# Patient Record
Sex: Female | Born: 1945 | Race: White | Hispanic: No | Smoking: Never smoker
Health system: Southern US, Community
[De-identification: ages and names within clinical notes are randomized; demographics above are authoritative.]

## PROBLEM LIST (undated history)

## (undated) DIAGNOSIS — C541 Malignant neoplasm of endometrium: Secondary | ICD-10-CM

## (undated) DIAGNOSIS — M1990 Unspecified osteoarthritis, unspecified site: Secondary | ICD-10-CM

## (undated) DIAGNOSIS — I10 Essential (primary) hypertension: Secondary | ICD-10-CM

## (undated) DIAGNOSIS — E785 Hyperlipidemia, unspecified: Secondary | ICD-10-CM

## (undated) DIAGNOSIS — T4145XA Adverse effect of unspecified anesthetic, initial encounter: Secondary | ICD-10-CM

## (undated) DIAGNOSIS — C801 Malignant (primary) neoplasm, unspecified: Secondary | ICD-10-CM

## (undated) DIAGNOSIS — M419 Scoliosis, unspecified: Secondary | ICD-10-CM

## (undated) DIAGNOSIS — T8859XA Other complications of anesthesia, initial encounter: Secondary | ICD-10-CM

## (undated) DIAGNOSIS — I219 Acute myocardial infarction, unspecified: Secondary | ICD-10-CM

## (undated) DIAGNOSIS — I341 Nonrheumatic mitral (valve) prolapse: Secondary | ICD-10-CM

## (undated) DIAGNOSIS — R0600 Dyspnea, unspecified: Secondary | ICD-10-CM

## (undated) DIAGNOSIS — I4510 Unspecified right bundle-branch block: Secondary | ICD-10-CM

## (undated) HISTORY — DX: Hyperlipidemia, unspecified: E78.5

## (undated) HISTORY — DX: Unspecified osteoarthritis, unspecified site: M19.90

## (undated) HISTORY — DX: Unspecified right bundle-branch block: I45.10

## (undated) HISTORY — DX: Essential (primary) hypertension: I10

## (undated) HISTORY — DX: Acute myocardial infarction, unspecified: I21.9

## (undated) HISTORY — DX: Nonrheumatic mitral (valve) prolapse: I34.1

## (undated) HISTORY — DX: Scoliosis, unspecified: M41.9

## (undated) HISTORY — PX: HYSTERECTOMY: SHX81

---

## 1999-11-27 DIAGNOSIS — I219 Acute myocardial infarction, unspecified: Secondary | ICD-10-CM

## 1999-11-27 HISTORY — DX: Acute myocardial infarction, unspecified: I21.9

## 2005-11-26 HISTORY — PX: KNEE SURGERY: SHX244

## 2014-08-11 ENCOUNTER — Encounter: Payer: Self-pay | Admitting: Family

## 2014-08-11 ENCOUNTER — Ambulatory Visit (INDEPENDENT_AMBULATORY_CARE_PROVIDER_SITE_OTHER): Payer: Medicare Other | Admitting: Family

## 2014-08-11 ENCOUNTER — Encounter (INDEPENDENT_AMBULATORY_CARE_PROVIDER_SITE_OTHER): Payer: Self-pay

## 2014-08-11 VITALS — BP 166/93 | HR 64 | Temp 97.5°F | Ht 63.5 in | Wt 175.8 lb

## 2014-08-11 DIAGNOSIS — Z0000 Encounter for general adult medical examination without abnormal findings: Secondary | ICD-10-CM

## 2014-08-11 DIAGNOSIS — Z13820 Encounter for screening for osteoporosis: Secondary | ICD-10-CM

## 2014-08-11 DIAGNOSIS — Z23 Encounter for immunization: Secondary | ICD-10-CM

## 2014-08-11 DIAGNOSIS — I10 Essential (primary) hypertension: Secondary | ICD-10-CM | POA: Insufficient documentation

## 2014-08-11 MED ORDER — OLMESARTAN MEDOXOMIL 40 MG PO TABS: 40.0000 mg | ORAL_TABLET | Freq: Every day | ORAL | Status: DC

## 2014-08-11 NOTE — Addendum Note (Signed)
Addended by: Shelbie Ammons on: 08/11/2014 11:40 AM   Modules accepted: Orders

## 2014-08-11 NOTE — Patient Instructions (Signed)
Health Maintenance Adopting a healthy lifestyle and getting preventive care can go a long way to promote health and wellness. Talk with your health care provider about what schedule of regular examinations is right for you. This is a good chance for you to check in with your provider about disease prevention and staying healthy. In between checkups, there are plenty of things you can do on your own. Experts have done a lot of research about which lifestyle changes and preventive measures are most likely to keep you healthy. Ask your health care provider for more information. WEIGHT AND DIET  Eat a healthy diet  Be sure to include plenty of vegetables, fruits, low-fat dairy products, and lean protein.  Do not eat a lot of foods high in solid fats, added sugars, or salt.  Get regular exercise. This is one of the most important things you can do for your health.  Most adults should exercise for at least 150 minutes each week. The exercise should increase your heart rate and make you sweat (moderate-intensity exercise).  Most adults should also do strengthening exercises at least twice a week. This is in addition to the moderate-intensity exercise.  Maintain a healthy weight  Body mass index (BMI) is a measurement that can be used to identify possible weight problems. It estimates body fat based on height and weight. Your health care provider can help determine your BMI and help you achieve or maintain a healthy weight.  For females 25 years of age and older:   A BMI below 18.5 is considered underweight.  A BMI of 18.5 to 24.9 is normal.  A BMI of 25 to 29.9 is considered overweight.  A BMI of 30 and above is considered obese.  Watch levels of cholesterol and blood lipids  You should start having your blood tested for lipids and cholesterol at 69 years of age, then have this test every 5 years.  You may need to have your cholesterol levels checked more often if:  Your lipid or  cholesterol levels are high.  You are older than 69 years of age.  You are at high risk for heart disease.  CANCER SCREENING   Lung Cancer  Lung cancer screening is recommended for adults 97-92 years old who are at high risk for lung cancer because of a history of smoking.  A yearly low-dose CT scan of the lungs is recommended for people who:  Currently smoke.  Have quit within the past 15 years.  Have at least a 30-pack-year history of smoking. A pack year is smoking an average of one pack of cigarettes a day for 1 year.  Yearly screening should continue until it has been 15 years since you quit.  Yearly screening should stop if you develop a health problem that would prevent you from having lung cancer treatment.  Breast Cancer  Practice breast self-awareness. This means understanding how your breasts normally appear and feel.  It also means doing regular breast self-exams. Let your health care provider know about any changes, no matter how small.  If you are in your 20s or 30s, you should have a clinical breast exam (CBE) by a health care provider every 1-3 years as part of a regular health exam.  If you are 76 or older, have a CBE every year. Also consider having a breast X-ray (mammogram) every year.  If you have a family history of breast cancer, talk to your health care provider about genetic screening.  If you are  at high risk for breast cancer, talk to your health care provider about having an MRI and a mammogram every year.  Breast cancer gene (BRCA) assessment is recommended for women who have family members with BRCA-related cancers. BRCA-related cancers include:  Breast.  Ovarian.  Tubal.  Peritoneal cancers.  Results of the assessment will determine the need for genetic counseling and BRCA1 and BRCA2 testing. Cervical Cancer Routine pelvic examinations to screen for cervical cancer are no longer recommended for nonpregnant women who are considered low  risk for cancer of the pelvic organs (ovaries, uterus, and vagina) and who do not have symptoms. A pelvic examination may be necessary if you have symptoms including those associated with pelvic infections. Ask your health care provider if a screening pelvic exam is right for you.   The Pap test is the screening test for cervical cancer for women who are considered at risk.  If you had a hysterectomy for a problem that was not cancer or a condition that could lead to cancer, then you no longer need Pap tests.  If you are older than 65 years, and you have had normal Pap tests for the past 10 years, you no longer need to have Pap tests.  If you have had past treatment for cervical cancer or a condition that could lead to cancer, you need Pap tests and screening for cancer for at least 20 years after your treatment.  If you no longer get a Pap test, assess your risk factors if they change (such as having a new sexual partner). This can affect whether you should start being screened again.  Some women have medical problems that increase their chance of getting cervical cancer. If this is the case for you, your health care provider may recommend more frequent screening and Pap tests.  The human papillomavirus (HPV) test is another test that may be used for cervical cancer screening. The HPV test looks for the virus that can cause cell changes in the cervix. The cells collected during the Pap test can be tested for HPV.  The HPV test can be used to screen women 30 years of age and older. Getting tested for HPV can extend the interval between normal Pap tests from three to five years.  An HPV test also should be used to screen women of any age who have unclear Pap test results.  After 69 years of age, women should have HPV testing as often as Pap tests.  Colorectal Cancer  This type of cancer can be detected and often prevented.  Routine colorectal cancer screening usually begins at 69 years of  age and continues through 69 years of age.  Your health care provider may recommend screening at an earlier age if you have risk factors for colon cancer.  Your health care provider may also recommend using home test kits to check for hidden blood in the stool.  A small camera at the end of a tube can be used to examine your colon directly (sigmoidoscopy or colonoscopy). This is done to check for the earliest forms of colorectal cancer.  Routine screening usually begins at age 50.  Direct examination of the colon should be repeated every 5-10 years through 69 years of age. However, you may need to be screened more often if early forms of precancerous polyps or small growths are found. Skin Cancer  Check your skin from head to toe regularly.  Tell your health care provider about any new moles or changes in   moles, especially if there is a change in a mole's shape or color.  Also tell your health care provider if you have a mole that is larger than the size of a pencil eraser.  Always use sunscreen. Apply sunscreen liberally and repeatedly throughout the day.  Protect yourself by wearing long sleeves, pants, a wide-brimmed hat, and sunglasses whenever you are outside. HEART DISEASE, DIABETES, AND HIGH BLOOD PRESSURE   Have your blood pressure checked at least every 1-2 years. High blood pressure causes heart disease and increases the risk of stroke.  If you are between 75 years and 42 years old, ask your health care provider if you should take aspirin to prevent strokes.  Have regular diabetes screenings. This involves taking a blood sample to check your fasting blood sugar level.  If you are at a normal weight and have a low risk for diabetes, have this test once every three years after 69 years of age.  If you are overweight and have a high risk for diabetes, consider being tested at a younger age or more often. PREVENTING INFECTION  Hepatitis B  If you have a higher risk for  hepatitis B, you should be screened for this virus. You are considered at high risk for hepatitis B if:  You were born in a country where hepatitis B is common. Ask your health care provider which countries are considered high risk.  Your parents were born in a high-risk country, and you have not been immunized against hepatitis B (hepatitis B vaccine).  You have HIV or AIDS.  You use needles to inject street drugs.  You live with someone who has hepatitis B.  You have had sex with someone who has hepatitis B.  You get hemodialysis treatment.  You take certain medicines for conditions, including cancer, organ transplantation, and autoimmune conditions. Hepatitis C  Blood testing is recommended for:  Everyone born from [birth year] through 1966.  Anyone with known risk factors for hepatitis C. Sexually transmitted infections (STIs)  You should be screened for sexually transmitted infections (STIs) including gonorrhea and chlamydia if:  You are sexually active and are younger than 69 years of age.  You are older than 69 years of age and your health care provider tells you that you are at risk for this type of infection.  Your sexual activity has changed since you were last screened and you are at an increased risk for chlamydia or gonorrhea. Ask your health care provider if you are at risk.  If you do not have HIV, but are at risk, it may be recommended that you take a prescription medicine daily to prevent HIV infection. This is called pre-exposure prophylaxis (PrEP). You are considered at risk if:  You are sexually active and do not regularly use condoms or know the HIV status of your partner(s).  You take drugs by injection.  You are sexually active with a partner who has HIV. Talk with your health care provider about whether you are at high risk of being infected with HIV. If you choose to begin PrEP, you should first be tested for HIV. You should then be tested every 3 months for  as long as you are taking PrEP.  PREGNANCY   If you are premenopausal and you may become pregnant, ask your health care provider about preconception counseling.  If you may become pregnant, take 400 to 800 micrograms (mcg) of folic acid every day.  If you want to prevent pregnancy, talk to your  health care provider about birth control (contraception). OSTEOPOROSIS AND MENOPAUSE   Osteoporosis is a disease in which the bones lose minerals and strength with aging. This can result in serious bone fractures. Your risk for osteoporosis can be identified using a bone density scan.  If you are 65 years of age or older, or if you are at risk for osteoporosis and fractures, ask your health care provider if you should be screened.  Ask your health care provider whether you should take a calcium or vitamin D supplement to lower your risk for osteoporosis.  Menopause may have certain physical symptoms and risks.  Hormone replacement therapy may reduce some of these symptoms and risks. Talk to your health care provider about whether hormone replacement therapy is right for you.  HOME CARE INSTRUCTIONS   Schedule regular health, dental, and eye exams.  Stay current with your immunizations.   Do not use any tobacco products including cigarettes, chewing tobacco, or electronic cigarettes.  If you are pregnant, do not drink alcohol.  If you are breastfeeding, limit how much and how often you drink alcohol.  Limit alcohol intake to no more than 1 drink per day for nonpregnant women. One drink equals 12 ounces of beer, 5 ounces of wine, or 1 ounces of hard liquor.  Do not use street drugs.  Do not share needles.  Ask your health care provider for help if you need support or information about quitting drugs.  Tell your health care provider if you often feel depressed.  Tell your health care provider if you have ever been abused or do not feel safe at home. Document Released: 05/28/2011  Document Revised: 03/29/2014 Document Reviewed: 10/14/2013 Exit[employer] Patient Information 2015 [employer]. This information is not intended to replace advice given to you by your health care provider. Make sure you discuss any questions you have with your health care provider. Hypertension Hypertension, commonly called high blood pressure, is when the force of blood pumping through your arteries is too strong. Your arteries are the blood vessels that carry blood from your heart throughout your body. A blood pressure reading consists of a higher number over a lower number, such as 110/72. The higher number (systolic) is the pressure inside your arteries when your heart pumps. The lower number (diastolic) is the pressure inside your arteries when your heart relaxes. Ideally you want your blood pressure below 120/80. Hypertension forces your heart to work harder to pump blood. Your arteries may become narrow or stiff. Having hypertension puts you at risk for heart disease, stroke, and other problems.  RISK FACTORS Some risk factors for high blood pressure are controllable. Others are not.  Risk factors you cannot control include:   Race. You may be at higher risk if you are African American.  Age. Risk increases with age.  Gender. Men are at higher risk than women before age 45 years. After age 65, women are at higher risk than men. Risk factors you can control include:  Not getting enough exercise or physical activity.  Being overweight.  Getting too much fat, sugar, calories, or salt in your diet.  Drinking too much alcohol. SIGNS AND SYMPTOMS Hypertension does not usually cause signs or symptoms. Extremely high blood pressure (hypertensive crisis) may cause headache, anxiety, shortness of breath, and nosebleed. DIAGNOSIS  To check if you have hypertension, your health care provider will measure your blood pressure while you are seated, with your arm held at the level of your heart. It    should be measured at least twice using the same arm. Certain conditions can cause a difference in blood pressure between your right and left arms. A blood pressure reading that is higher than normal on one occasion does not mean that you need treatment. If one blood pressure reading is high, ask your health care provider about having it checked again. TREATMENT  Treating high blood pressure includes making lifestyle changes and possibly taking medicine. Living a healthy lifestyle can help lower high blood pressure. You may need to change some of your habits. Lifestyle changes may include:  Following the DASH diet. This diet is high in fruits, vegetables, and whole grains. It is low in salt, red meat, and added sugars.  Getting at least 2 hours of brisk physical activity every week.  Losing weight if necessary.  Not smoking.  Limiting alcoholic beverages.  Learning ways to reduce stress. If lifestyle changes are not enough to get your blood pressure under control, your health care provider may prescribe medicine. You may need to take more than one. Work closely with your health care provider to understand the risks and benefits. HOME CARE INSTRUCTIONS  Have your blood pressure rechecked as directed by your health care provider.   Take medicines only as directed by your health care provider. Follow the directions carefully. Blood pressure medicines must be taken as prescribed. The medicine does not work as well when you skip doses. Skipping doses also puts you at risk for problems.   Do not smoke.   Monitor your blood pressure at home as directed by your health care provider. SEEK MEDICAL CARE IF:   You think you are having a reaction to medicines taken.  You have recurrent headaches or feel dizzy.  You have swelling in your ankles.  You have trouble with your vision. SEEK IMMEDIATE MEDICAL CARE IF:  You develop a severe headache or confusion.  You have unusual weakness,  numbness, or feel faint.  You have severe chest or abdominal pain.  You vomit repeatedly.  You have trouble breathing. MAKE SURE YOU:   Understand these instructions.  Will watch your condition.  Will get help right away if you are not doing well or get worse. Document Released: 11/12/2005 Document Revised: 03/29/2014 Document Reviewed: 09/04/2013 Exit[employer] Patient Information 2015 [employer]. This information is not intended to replace advice given to you by your health care provider. Make sure you discuss any questions you have with your health care provider.  

## 2014-08-11 NOTE — Progress Notes (Signed)
   Subjective:    Patient ID: Denise Fox, female    DOB: [birth year], 68 y.o.   MRN: [id number]  Hypertension This is a chronic problem. The current episode started more than 1 year ago. The problem has been waxing and waning since onset. The problem is uncontrolled. Associated symptoms include anxiety and shortness of breath. Pertinent negatives include no headaches, palpitations or peripheral edema. Risk factors for coronary artery disease include family history and post-menopausal state. Past treatments include nothing. The current treatment provides no improvement. Compliance problems include medication side effects.  Hypertensive end-organ damage includes CAD/MI. There is no history of kidney disease, CVA, heart failure or a thyroid problem. There is no history of sleep apnea.   *Pt states in the past she has been on lisinopril and HCTZ. Both made her feel anxious and she did not like the way it made her feel.    Review of Systems  Constitutional: Negative.   HENT: Negative.   Eyes: Negative.   Respiratory: Positive for shortness of breath.   Cardiovascular: Negative.  Negative for palpitations.  Gastrointestinal: Negative.   Endocrine: Negative.   Genitourinary: Negative.   Musculoskeletal: Negative.   Neurological: Negative.  Negative for headaches.  Hematological: Negative.   Psychiatric/Behavioral: Negative.   All other systems reviewed and are negative.      Objective:   Physical Exam  Vitals reviewed. Constitutional: She is oriented to person, place, and time. She appears well-developed and well-nourished. No distress.  HENT:  Head: Normocephalic and atraumatic.  Right Ear: External ear normal.  Left Ear: External ear normal.  Nose: Nose normal.  Mouth/Throat: Oropharynx is clear and moist.  Eyes: Pupils are equal, round, and reactive to light.  Neck: Normal range of motion. Neck supple. No thyromegaly present.  Cardiovascular: Normal rate, regular rhythm, normal heart  sounds and intact distal pulses.   No murmur heard. Pulmonary/Chest: Effort normal and breath sounds normal. No respiratory distress. She has no wheezes.  Abdominal: Soft. Bowel sounds are normal. She exhibits no distension. There is no tenderness.  Musculoskeletal: Normal range of motion. She exhibits no edema and no tenderness.  Neurological: She is alert and oriented to person, place, and time. She has normal reflexes. No cranial nerve deficit.  Skin: Skin is warm and dry.  Psychiatric: She has a normal mood and affect. Her behavior is normal. Judgment and thought content normal.    BP 166/93  Pulse 64  Temp(Src) 97.5 F (36.4 C) (Oral)  Ht 5' 3.5" (1.613 m)  Wt 175 lb 12.8 oz (79.742 kg)  BMI 30.65 kg/m2       Assessment & Plan:  1. Essential hypertension, benign -Pt started on Benicar today  CMP14+EGFR - olmesartan (BENICAR) 40 MG tablet; Take 1 tablet (40 mg total) by mouth daily.  Dispense: 90 tablet; Refill: 3  2. Osteoporosis screening - DG Bone Density; Future  3. Annual physical exam - DG Bone Density; Future - CMP14+EGFR - Lipid panel - Vit D  25 hydroxy (rtn osteoporosis monitoring)   Continue all meds Labs pending Health Maintenance reviewed-hemoccult cards given to patient with directions Diet and exercise encouraged RTO 2 weeks for blood pressure recheck  Evelina Dun, FNP

## 2014-08-12 LAB — CMP14+EGFR
ALT: 29 IU/L (ref 0–32)
AST: 23 IU/L (ref 0–40)
Albumin/Globulin Ratio: 1.8 (ref 1.1–2.5)
Albumin: 4.6 g/dL (ref 3.6–4.8)
Alkaline Phosphatase: 81 IU/L (ref 39–117)
BUN / CREAT RATIO: 16 (ref 11–26)
BUN: 14 mg/dL (ref 8–27)
CALCIUM: 10.3 mg/dL (ref 8.7–10.3)
CO2: 26 mmol/L (ref 18–29)
Chloride: 101 mmol/L (ref 97–108)
Creatinine, Ser: 0.87 mg/dL (ref 0.57–1.00)
GFR calc Af Amer: 79 mL/min/{1.73_m2} (ref >59)
GFR, EST NON AFRICAN AMERICAN: 68 mL/min/{1.73_m2} (ref >59)
Globulin, Total: 2.6 g/dL (ref 1.5–4.5)
Glucose: 109 mg/dL — ABNORMAL HIGH (ref 65–99)
Potassium: 4.8 mmol/L (ref 3.5–5.2)
Sodium: 141 mmol/L (ref 134–144)
Total Bilirubin: 0.6 mg/dL (ref 0.0–1.2)
Total Protein: 7.2 g/dL (ref 6.0–8.5)

## 2014-08-12 LAB — LIPID PANEL
CHOL/HDL RATIO: 4.2 ratio (ref 0.0–4.4)
Cholesterol, Total: 235 mg/dL — ABNORMAL HIGH (ref 100–199)
HDL: 56 mg/dL (ref >39)
LDL Calculated: 157 mg/dL — ABNORMAL HIGH (ref 0–99)
Triglycerides: 109 mg/dL (ref 0–149)
VLDL CHOLESTEROL CAL: 22 mg/dL (ref 5–40)

## 2014-08-12 LAB — VITAMIN D 25 HYDROXY (VIT D DEFICIENCY, FRACTURES): Vit D, 25-Hydroxy: 31.6 ng/mL (ref 30.0–100.0)

## 2014-08-13 ENCOUNTER — Telehealth: Payer: Self-pay | Admitting: *Deleted

## 2014-08-13 ENCOUNTER — Other Ambulatory Visit: Payer: Self-pay | Admitting: Family

## 2014-08-13 MED ORDER — SIMVASTATIN 40 MG PO TABS: 40.0000 mg | ORAL_TABLET | Freq: Every day | ORAL | Status: DC

## 2014-08-13 NOTE — Telephone Encounter (Signed)
Discussed lab results and need for cholesterol medication.  Patient had received call from pharmacy about new medication but had not had a call from [facility] detailing lab results.

## 2014-08-26 ENCOUNTER — Telehealth: Payer: Self-pay | Admitting: Family

## 2014-08-26 DIAGNOSIS — I10 Essential (primary) hypertension: Secondary | ICD-10-CM

## 2014-08-26 MED ORDER — OLMESARTAN MEDOXOMIL 40 MG PO TABS: 40.0000 mg | ORAL_TABLET | Freq: Every day | ORAL | Status: DC

## 2014-08-26 NOTE — Telephone Encounter (Signed)
Samples up front no answer

## 2014-08-30 ENCOUNTER — Ambulatory Visit (INDEPENDENT_AMBULATORY_CARE_PROVIDER_SITE_OTHER): Payer: Medicare Other | Admitting: Family

## 2014-08-30 ENCOUNTER — Encounter: Payer: Self-pay | Admitting: Family

## 2014-08-30 VITALS — BP 148/89 | HR 77 | Temp 98.3°F | Ht 63.5 in | Wt 173.0 lb

## 2014-08-30 DIAGNOSIS — Z1212 Encounter for screening for malignant neoplasm of rectum: Secondary | ICD-10-CM

## 2014-08-30 DIAGNOSIS — I10 Essential (primary) hypertension: Secondary | ICD-10-CM

## 2014-08-30 DIAGNOSIS — Z23 Encounter for immunization: Secondary | ICD-10-CM

## 2014-08-30 MED ORDER — OLMESARTAN MEDOXOMIL 40 MG PO TABS: 40.0000 mg | ORAL_TABLET | Freq: Every day | ORAL | Status: DC

## 2014-08-30 MED ORDER — METOPROLOL SUCCINATE ER 25 MG PO TB24: 25.0000 mg | ORAL_TABLET | Freq: Every day | ORAL | Status: DC

## 2014-08-30 NOTE — Addendum Note (Signed)
Addended by: Earlene Plater on: 08/30/2014 04:18 PM   Modules accepted: Orders

## 2014-08-30 NOTE — Progress Notes (Signed)
   Subjective:    Patient ID: Denise Fox, female    DOB: [birth year], 68 y.o.   MRN: [id number]  Hypertension Pertinent negatives include no headaches, palpitations or shortness of breath.   Pt presents to office for follow-up on hypertension. Pt was started on Benicar 40 mg on 08/11/14. Pt's blood pressure is not at goal today. Pt denies any headache, SOB, palpitations, or edema at this time.   *Pt has tired ACE and HCTZ in past and states she can not tolerate these medications. Pt states makes her feel "anxious".   Review of Systems  Constitutional: Negative.   HENT: Negative.   Eyes: Negative.   Respiratory: Negative.  Negative for shortness of breath.   Cardiovascular: Negative.  Negative for palpitations.  Gastrointestinal: Negative.   Endocrine: Negative.   Genitourinary: Negative.   Musculoskeletal: Negative.   Neurological: Negative.  Negative for headaches.  Hematological: Negative.   Psychiatric/Behavioral: Negative.   All other systems reviewed and are negative.      Objective:   Physical Exam  Vitals reviewed. Constitutional: She is oriented to person, place, and time. She appears well-developed and well-nourished. No distress.  HENT:  Head: Normocephalic and atraumatic.  Right Ear: External ear normal.  Mouth/Throat: Oropharynx is clear and moist.  Eyes: Pupils are equal, round, and reactive to light.  Neck: Normal range of motion. Neck supple. No thyromegaly present.  Cardiovascular: Normal rate, regular rhythm, normal heart sounds and intact distal pulses.   No murmur heard. Pulmonary/Chest: Effort normal and breath sounds normal. No respiratory distress. She has no wheezes.  Abdominal: Soft. Bowel sounds are normal. She exhibits no distension. There is no tenderness.  Musculoskeletal: Normal range of motion. She exhibits no edema and no tenderness.  Neurological: She is alert and oriented to person, place, and time. She has normal reflexes. No cranial nerve  deficit.  Skin: Skin is warm and dry.  Psychiatric: She has a normal mood and affect. Her behavior is normal. Judgment and thought content normal.    BP 148/89  Pulse 77  Temp(Src) 98.3 F (36.8 C) (Oral)  Ht 5' 3.5" (1.613 m)  Wt 173 lb (78.472 kg)  BMI 30.16 kg/m2       Assessment & Plan:  1. Essential hypertension, benign --Daily blood pressure log given with instructions on how to fill out and told to bring to next visit -Dash diet information given -Exercise encouraged - Stress Management  -Continue current meds -RTO in 2 weeks - metoprolol succinate (TOPROL-XL) 25 MG 24 hr tablet; Take 1 tablet (25 mg total) by mouth daily.  Dispense: 90 tablet; Refill: 3 - olmesartan (BENICAR) 40 MG tablet; Take 1 tablet (40 mg total) by mouth daily.  Dispense: 90 tablet; Refill: [place], FNP

## 2014-08-30 NOTE — Patient Instructions (Signed)
DASH Eating Plan DASH stands for "Dietary Approaches to Stop Hypertension." The DASH eating plan is a healthy eating plan that has been shown to reduce high blood pressure (hypertension). Additional health benefits may include reducing the risk of type 2 diabetes mellitus, heart disease, and stroke. The DASH eating plan may also help with weight loss. WHAT DO I NEED TO KNOW ABOUT THE DASH EATING PLAN? For the DASH eating plan, you will follow these general guidelines:  Choose foods with a percent daily value for sodium of less than 5% (as listed on the food label).  Use salt-free seasonings or herbs instead of table salt or sea salt.  Check with your health care provider or pharmacist before using salt substitutes.  Eat lower-sodium products, often labeled as "lower sodium" or "no salt added."  Eat fresh foods.  Eat more vegetables, fruits, and low-fat dairy products.  Choose whole grains. Look for the word "whole" as the first word in the ingredient list.  Choose fish and skinless chicken or turkey more often than red meat. Limit fish, poultry, and meat to 6 oz (170 g) each day.  Limit sweets, desserts, sugars, and sugary drinks.  Choose heart-healthy fats.  Limit cheese to 1 oz (28 g) per day.  Eat more home-cooked food and less restaurant, buffet, and fast food.  Limit fried foods.  Cook foods using methods other than frying.  Limit canned vegetables. If you do use them, rinse them well to decrease the sodium.  When eating at a restaurant, ask that your food be prepared with less salt, or no salt if possible. WHAT FOODS CAN I EAT? Seek help from a dietitian for individual calorie needs. Grains Whole grain or whole wheat bread. Brown rice. Whole grain or whole wheat pasta. Quinoa, bulgur, and whole grain cereals. Low-sodium cereals. Corn or whole wheat flour tortillas. Whole grain cornbread. Whole grain crackers. Low-sodium crackers. Vegetables Fresh or frozen vegetables  (raw, steamed, roasted, or grilled). Low-sodium or reduced-sodium tomato and vegetable juices. Low-sodium or reduced-sodium tomato sauce and paste. Low-sodium or reduced-sodium canned vegetables.  Fruits All fresh, canned (in natural juice), or frozen fruits. Meat and Other Protein Products Ground beef (85% or leaner), grass-fed beef, or beef trimmed of fat. Skinless chicken or turkey. Ground chicken or turkey. Pork trimmed of fat. All fish and seafood. Eggs. Dried beans, peas, or lentils. Unsalted nuts and seeds. Unsalted canned beans. Dairy Low-fat dairy products, such as skim or 1% milk, 2% or reduced-fat cheeses, low-fat ricotta or cottage cheese, or plain low-fat yogurt. Low-sodium or reduced-sodium cheeses. Fats and Oils Tub margarines without trans fats. Light or reduced-fat mayonnaise and salad dressings (reduced sodium). Avocado. Safflower, olive, or canola oils. Natural peanut or almond butter. Other Unsalted popcorn and pretzels. The items listed above may not be a complete list of recommended foods or beverages. Contact your dietitian for more options. WHAT FOODS ARE NOT RECOMMENDED? Grains White bread. White pasta. White rice. Refined cornbread. Bagels and croissants. Crackers that contain trans fat. Vegetables Creamed or fried vegetables. Vegetables in a cheese sauce. Regular canned vegetables. Regular canned tomato sauce and paste. Regular tomato and vegetable juices. Fruits Dried fruits. Canned fruit in light or heavy syrup. Fruit juice. Meat and Other Protein Products Fatty cuts of meat. Ribs, chicken wings, bacon, sausage, bologna, salami, chitterlings, fatback, hot dogs, bratwurst, and packaged luncheon meats. Salted nuts and seeds. Canned beans with salt. Dairy Whole or 2% milk, cream, half-and-half, and cream cheese. Whole-fat or sweetened yogurt. Full-fat   cheeses or blue cheese. Nondairy creamers and whipped toppings. Processed cheese, cheese spreads, or cheese  curds. Condiments Onion and garlic salt, seasoned salt, table salt, and sea salt. Canned and packaged gravies. Worcestershire sauce. Tartar sauce. Barbecue sauce. Teriyaki sauce. Soy sauce, including reduced sodium. Steak sauce. Fish sauce. Oyster sauce. Cocktail sauce. Horseradish. Ketchup and mustard. Meat flavorings and tenderizers. Bouillon cubes. Hot sauce. Tabasco sauce. Marinades. Taco seasonings. Relishes. Fats and Oils Butter, stick margarine, lard, shortening, ghee, and bacon fat. Coconut, palm kernel, or palm oils. Regular salad dressings. Other Pickles and olives. Salted popcorn and pretzels. The items listed above may not be a complete list of foods and beverages to avoid. Contact your dietitian for more information. WHERE CAN I FIND MORE INFORMATION? National Heart, Lung, and Blood Institute: www.nhlbi.nih.gov/health/health-topics/topics/dash/ Document Released: 11/01/2011 Document Revised: 03/29/2014 Document Reviewed: 09/16/2013 ExitCare Patient Information 2015 [employer]. This information is not intended to replace advice given to you by your health care provider. Make sure you discuss any questions you have with your health care provider. Hypertension Hypertension, commonly called high blood pressure, is when the force of blood pumping through your arteries is too strong. Your arteries are the blood vessels that carry blood from your heart throughout your body. A blood pressure reading consists of a higher number over a lower number, such as 110/72. The higher number (systolic) is the pressure inside your arteries when your heart pumps. The lower number (diastolic) is the pressure inside your arteries when your heart relaxes. Ideally you want your blood pressure below 120/80. Hypertension forces your heart to work harder to pump blood. Your arteries may become narrow or stiff. Having hypertension puts you at risk for heart disease, stroke, and other problems.  RISK  FACTORS Some risk factors for high blood pressure are controllable. Others are not.  Risk factors you cannot control include:   Race. You may be at higher risk if you are African American.  Age. Risk increases with age.  Gender. Men are at higher risk than women before age 45 years. After age 65, women are at higher risk than men. Risk factors you can control include:  Not getting enough exercise or physical activity.  Being overweight.  Getting too much fat, sugar, calories, or salt in your diet.  Drinking too much alcohol. SIGNS AND SYMPTOMS Hypertension does not usually cause signs or symptoms. Extremely high blood pressure (hypertensive crisis) may cause headache, anxiety, shortness of breath, and nosebleed. DIAGNOSIS  To check if you have hypertension, your health care provider will measure your blood pressure while you are seated, with your arm held at the level of your heart. It should be measured at least twice using the same arm. Certain conditions can cause a difference in blood pressure between your right and left arms. A blood pressure reading that is higher than normal on one occasion does not mean that you need treatment. If one blood pressure reading is high, ask your health care provider about having it checked again. TREATMENT  Treating high blood pressure includes making lifestyle changes and possibly taking medicine. Living a healthy lifestyle can help lower high blood pressure. You may need to change some of your habits. Lifestyle changes may include:  Following the DASH diet. This diet is high in fruits, vegetables, and whole grains. It is low in salt, red meat, and added sugars.  Getting at least 2 hours of brisk physical activity every week.  Losing weight if necessary.  Not smoking.  Limiting   alcoholic beverages.  Learning ways to reduce stress. If lifestyle changes are not enough to get your blood pressure under control, your health care provider may  prescribe medicine. You may need to take more than one. Work closely with your health care provider to understand the risks and benefits. HOME CARE INSTRUCTIONS  Have your blood pressure rechecked as directed by your health care provider.   Take medicines only as directed by your health care provider. Follow the directions carefully. Blood pressure medicines must be taken as prescribed. The medicine does not work as well when you skip doses. Skipping doses also puts you at risk for problems.   Do not smoke.   Monitor your blood pressure at home as directed by your health care provider. SEEK MEDICAL CARE IF:   You think you are having a reaction to medicines taken.  You have recurrent headaches or feel dizzy.  You have swelling in your ankles.  You have trouble with your vision. SEEK IMMEDIATE MEDICAL CARE IF:  You develop a severe headache or confusion.  You have unusual weakness, numbness, or feel faint.  You have severe chest or abdominal pain.  You vomit repeatedly.  You have trouble breathing. MAKE SURE YOU:   Understand these instructions.  Will watch your condition.  Will get help right away if you are not doing well or get worse. Document Released: 11/12/2005 Document Revised: 03/29/2014 Document Reviewed: 09/04/2013 ExitCare Patient Information 2015 [employer]. This information is not intended to replace advice given to you by your health care provider. Make sure you discuss any questions you have with your health care provider.  

## 2014-08-31 ENCOUNTER — Telehealth: Payer: Self-pay | Admitting: Family

## 2014-08-31 LAB — FECAL OCCULT BLOOD, IMMUNOCHEMICAL: FECAL OCCULT BLD: POSITIVE — AB

## 2014-08-31 NOTE — Telephone Encounter (Signed)
Appointment made with [facility]

## 2014-09-01 ENCOUNTER — Other Ambulatory Visit: Payer: Self-pay | Admitting: Family

## 2014-09-01 DIAGNOSIS — R195 Other fecal abnormalities: Secondary | ICD-10-CM

## 2014-09-08 ENCOUNTER — Ambulatory Visit (INDEPENDENT_AMBULATORY_CARE_PROVIDER_SITE_OTHER): Payer: Medicare Other

## 2014-09-08 ENCOUNTER — Ambulatory Visit (INDEPENDENT_AMBULATORY_CARE_PROVIDER_SITE_OTHER): Payer: Medicare Other | Admitting: Pharmacist

## 2014-09-08 ENCOUNTER — Encounter: Payer: Self-pay | Admitting: Pharmacist

## 2014-09-08 VITALS — BP 136/90 | HR 76 | Ht 62.0 in | Wt 175.0 lb

## 2014-09-08 DIAGNOSIS — M8580 Other specified disorders of bone density and structure, unspecified site: Secondary | ICD-10-CM | POA: Insufficient documentation

## 2014-09-08 DIAGNOSIS — Z0000 Encounter for general adult medical examination without abnormal findings: Secondary | ICD-10-CM

## 2014-09-08 DIAGNOSIS — Z13820 Encounter for screening for osteoporosis: Secondary | ICD-10-CM

## 2014-09-08 DIAGNOSIS — E785 Hyperlipidemia, unspecified: Secondary | ICD-10-CM | POA: Insufficient documentation

## 2014-09-08 LAB — HM DEXA SCAN

## 2014-09-08 NOTE — Progress Notes (Signed)
Patient ID: Denise Fox, female   DOB: 1946/01/28, 68 y.o.   MRN: [id number] Subjective:    Denise Fox is a 68 y.o. female who presents for Medicare Initial Wellness Visit and discuss DEXA results. Patient is officially a [place] resident - spends November through April in [place] and rest of year in [place].    Preventive Screening-Counseling & Management  Tobacco History  Smoking status  . Never Smoker   Smokeless tobacco  . Never Used    Current Problems (verified) Patient Active Problem List   Diagnosis Date Noted  . Hyperlipidemia 09/08/2014  . Osteopenia 09/08/2014  . Essential hypertension, benign 08/11/2014    Medications Prior to Visit Current Outpatient Prescriptions on File Prior to Visit  Medication Sig Dispense Refill  . metoprolol succinate (TOPROL-XL) 25 MG 24 hr tablet Take 1 tablet (25 mg total) by mouth daily.  90 tablet  3  . olmesartan (BENICAR) 40 MG tablet Take 1 tablet (40 mg total) by mouth daily.  90 tablet  4   No current facility-administered medications on file prior to visit.    Current Medications (verified) Current Outpatient Prescriptions  Medication Sig Dispense Refill  . metoprolol succinate (TOPROL-XL) 25 MG 24 hr tablet Take 1 tablet (25 mg total) by mouth daily.  90 tablet  3  . olmesartan (BENICAR) 40 MG tablet Take 1 tablet (40 mg total) by mouth daily.  90 tablet  4   No current facility-administered medications for this visit.     Allergies (verified) Epinephrine; Hct; and Lisinopril   PAST HISTORY  Family History Family History  Problem Relation Age of Onset  . Cancer Mother     bladder / ureater  . Hypertension Mother   . Thyroid disease Mother   . Lung disease Father   . Heart disease Father   . Glaucoma Sister     Social History History  Substance Use Topics  . Smoking status: Never Smoker   . Smokeless tobacco: Never Used  . Alcohol Use: Yes     Comment: rare     Are there smokers in your home (other  than you)? No  Risk Factors Current exercise habits: outdoor work  Dietary issues discussed: limiting fat - mediterrean diet discussed   Cardiac risk factors: advanced age (older than 68 for men, 33 for women), dyslipidemia, family history of premature cardiovascular disease, hypertension and obesity (BMI >= 30 kg/m2).  Depression Screen (Note: if answer to either of the following is "Yes", a more complete depression screening is indicated)   Over the past 2 weeks, have you felt down, depressed or hopeless? No  Over the past 2 weeks, have you felt little interest or pleasure in doing things? No  Have you lost interest or pleasure in daily life? No  Do you often feel hopeless? No  Do you cry easily over simple problems? No  Activities of Daily Living In your present state of health, do you have any difficulty performing the following activities?:  Driving? No Managing money?  No Feeding yourself? No Getting from bed to chair? No  Climbing a flight of stairs? No Preparing food and eating?: No Bathing or showering? No Getting dressed: No Getting to the toilet? No Using the toilet:No Moving around from place to place: No In the past year have you fallen or had a near fall?:No   Are you sexually active?  No  Do you have more than one partner?  No  Hearing Difficulties: Yes - has  been evaluated in past by audiologist and also had hearing aids - does not wear because worsens tinnitis Do you often ask people to speak up or repeat themselves? No Do you experience ringing or noises in your ears? Yes Do you have difficulty understanding soft or whispered voices? No   Do you feel that you have a problem with memory? No  Do you often misplace items? No  Do you feel safe at home?  No  Cognitive Testing  Alert? Yes  Normal Appearance?Yes  Oriented to person? Yes  Place? Yes   Time? Yes  Recall of three objects?  Yes  Can perform simple calculations? Yes  Displays appropriate  judgment?Yes  Can read the correct time from a watch face?Yes   Advanced Directives have been discussed with the patient? Yes  HPI: Does pt already have a diagnosis of:  Osteopenia?  No Osteoporosis?  No  Back Pain?  No       Kyphosis?  No Prior fracture?  No                                                              Hysterectomy?  No Oophorectomy?  No HRT? No Steroid Use?  No Thyroid med?  No History of cancer?  No History of digestive disorders (ie Crohn's)?  No Current or previous eating disorders?  No Last Vitamin D Result:  31.6 (08/12/2014) Last GFR Result:  68 (08/12/2014)   FH/SH: Family history of osteoporosis?  No Parent with history of hip fracture?  No Family history of breast cancer?  No    Calcium Assessment Calcium Intake  # of servings/day  Calcium mg  Milk (8 oz) 1  x  300  = 300mg   Yogurt (4 oz) 0 x  200 = 0  Cheese (1 oz) 1 x  200 = 200mg   Other Calcium sources   250mg   Ca supplement Mag Cal 500mg  qd = 500mg    Estimated calcium intake per day 1250mg    Indicate any recent Medical Services you may have received from other than [facility] providers in the past year (date may be approximate).  Immunization History  Administered Date(s) Administered  . Influenza,inj,Quad PF,36+ Mos 08/30/2014  . Pneumococcal Conjugate-13 08/11/2014  . Tdap 05/16/2012    Screening Tests Health Maintenance  Topic Date Due  . Mammogram  02/09/2015 (Originally 08/03/1995)  . Colonoscopy  02/09/2015 (Originally 08/03/1995)  . Influenza Vaccine  06/27/2015  . Tetanus/tdap  05/16/2022  . Pneumococcal Polysaccharide Vaccine Age 69 And Over  Addressed  . Zostavax  Addressed    All answers were reviewed with the patient and necessary referrals were made:  Cherre Robins, [facility]   09/08/2014   History reviewed: allergies, current medications, past family history, past medical history, past social history, past surgical history and problem list   Objective:    Body mass  index is 32 kg/(m^2). BP 136/90  Pulse 76  Ht 5\' 2"  (1.575 m)  Wt 175 lb (79.379 kg)  BMI 32.00 kg/m2  Maximum Lifetime Height = 5\' 3"          DEXA Results Date of Test T-Score for AP Spine L1-L4 T-Score for Total Left Hip T-Score for Total Right Hip  09/08/2014 -0.8 -1.1 -0.8  FRAX 10 year estimate: Total FX risk:  9%  (consider medication if >/= 20%) Hip FX risk:  1%  (consider medication if >/= 3%)    Assessment:     Initial Medicare Wellness Visit Osteopenia Hyperlipidemia - simvastatin was prescribed for patient but she refuses to take statin.     Plan:     During the course of the visit the patient was educated and counseled about appropriate screening and preventive services including:    Pneumococcal vaccine - UTD  Influenza vaccine - UTD  Td vaccine - UTD  Screening mammography - discussed with patient.  She is considering  Screening Pap smear and pelvic exam - No longer recommended.  History of normal PAP - patient is instructed that is she has any vaginal discomfort / discharge to notify PCP  Bone densitometry screening - done today  Colorectal cancer screening - patient has never has colonoscopy.  She recently had positive FOBT - recommended colonoscopy but she refused.  I did give her pamphlet about Cologuard testing.   Diabetes screening  Glaucoma screening - patient sees optomotrist yearly in [place].  She states he does glaucoma test yearly.  Nutrition counseling - since patient is refusing statin therapy for hyperlipidemia, I discussed Mediterrean diet with her and other dietary ways to decrease LDL.  Discussed benefits of statin therapy versus risks.  Patient continues to refuse statin.  Advanced directives: has an advanced directive - a copy HAS NOT been provided    continue calcium 1200mg  daily through supplementation or diet.   recommend weight bearing exercise - 30 minutes at least 4 days per week.    Counseled and  educated about fall risk and prevention.  Recheck DEXA:  2 years  Patient Instructions (the written plan) was given to the patient.  Medicare Attestation I have personally reviewed: The patient's medical and social history Their use of alcohol, tobacco or illicit drugs Their current medications and supplements The patient's functional ability including ADLs,fall risks, home safety risks, cognitive, and hearing and visual impairment Diet and physical activities Evidence for depression or mood disorders  The patient's weight, height, BMI, and visual acuity have been recorded in the chart.  I have made referrals, counseling, and provided education to the patient based on review of the above and I have provided the patient with a written personalized care plan for preventive services.     Cherre Robins, [facility]   09/08/2014

## 2014-09-08 NOTE — Patient Instructions (Signed)
Health Maintenance Summary    MAMMOGRAM Recommended      COLONOSCOPY Redommended      INFLUENZA VACCINE Next Due 06/27/2015  up to date   zostavax - shingles completed     Bone Density Next due  09/08/2014 Up to date    TETANUS/TDAP Next Due 05/16/2022  up to date      Fall Prevention and Home Safety Falls cause injuries and can affect all age groups. It is possible to use preventive measures to significantly decrease the likelihood of falls. There are many simple measures which can make your home safer and prevent falls. OUTDOORS  Repair cracks and edges of walkways and driveways.  Remove high doorway thresholds.  Trim shrubbery on the main path into your home.  Have good outside lighting.  Clear walkways of tools, rocks, debris, and clutter.  Check that handrails are not broken and are securely fastened. Both sides of steps should have handrails.  Have leaves, snow, and ice cleared regularly.  Use sand or salt on walkways during winter months.  In the garage, clean up grease or oil spills. BATHROOM  Install night lights.  Install grab bars by the toilet and in the tub and shower.  Use non-skid mats or decals in the tub or shower.  Place a plastic non-slip stool in the shower to sit on, if needed.  Keep floors dry and clean up all water on the floor immediately.  Remove soap buildup in the tub or shower on a regular basis.  Secure bath mats with non-slip, double-sided rug tape.  Remove throw rugs and tripping hazards from the floors. BEDROOMS  Install night lights.  Make sure a bedside light is easy to reach.  Do not use oversized bedding.  Keep a telephone by your bedside.  Have a firm chair with side arms to use for getting dressed.  Remove throw rugs and tripping hazards from the floor. KITCHEN  Keep handles on pots and pans turned toward the center of the stove. Use back burners when possible.  Clean up spills quickly and allow time for  drying.  Avoid walking on wet floors.  Avoid hot utensils and knives.  Position shelves so they are not too high or low.  Place commonly used objects within easy reach.  If necessary, use a sturdy step stool with a grab bar when reaching.  Keep electrical cables out of the way.  Do not use floor polish or wax that makes floors slippery. If you must use wax, use non-skid floor wax.  Remove throw rugs and tripping hazards from the floor. STAIRWAYS  Never leave objects on stairs.  Place handrails on both sides of stairways and use them. Fix any loose handrails. Make sure handrails on both sides of the stairways are as long as the stairs.  Check carpeting to make sure it is firmly attached along stairs. Make repairs to worn or loose carpet promptly.  Avoid placing throw rugs at the top or bottom of stairways, or properly secure the rug with carpet tape to prevent slippage. Get rid of throw rugs, if possible.  Have an electrician put in a light switch at the top and bottom of the stairs. OTHER FALL PREVENTION TIPS  Wear low-heel or rubber-soled shoes that are supportive and fit well. Wear closed toe shoes.  When using a stepladder, make sure it is fully opened and both spreaders are firmly locked. Do not climb a closed stepladder.  Add color or contrast paint or tape  to grab bars and handrails in your home. Place contrasting color strips on first and last steps.  Learn and use mobility aids as needed. Install an electrical emergency response system.  Turn on lights to avoid dark areas. Replace light bulbs that burn out immediately. Get light switches that glow.  Arrange furniture to create clear pathways. Keep furniture in the same place.  Firmly attach carpet with non-skid or double-sided tape.  Eliminate uneven floor surfaces.  Select a carpet pattern that does not visually hide the edge of steps.  Be aware of all pets. OTHER HOME SAFETY TIPS  Set the water temperature  for 120 F (48.8 C).  Keep emergency numbers on or near the telephone.  Keep smoke detectors on every level of the home and near sleeping areas. Document Released: 11/02/2002 Document Revised: 05/13/2012 Document Reviewed: 02/01/2012 [facility] Patient Information 2015 [place], [place]. This information is not intended to replace advice given to you by your health care provider. Make sure you discuss any questions you have with your health care provider.

## 2014-09-13 ENCOUNTER — Encounter: Payer: Self-pay | Admitting: Family Medicine

## 2014-09-13 ENCOUNTER — Ambulatory Visit (INDEPENDENT_AMBULATORY_CARE_PROVIDER_SITE_OTHER): Payer: Medicare Other | Admitting: Family Medicine

## 2014-09-13 VITALS — BP 159/87 | HR 58 | Temp 97.1°F | Ht 62.0 in | Wt 173.4 lb

## 2014-09-13 DIAGNOSIS — I10 Essential (primary) hypertension: Secondary | ICD-10-CM

## 2014-09-13 MED ORDER — AMLODIPINE BESYLATE 5 MG PO TABS: 5.0000 mg | ORAL_TABLET | Freq: Every day | ORAL | Status: DC

## 2014-09-13 NOTE — Progress Notes (Signed)
   Subjective:    Patient ID: Denise Fox, female    DOB: July 01, 1946, 68 y.o.   MRN: [id number]  HPI This 68 y.o. female presents for evaluation of follow up bp.   Review of Systems    No chest pain, SOB, HA, dizziness, vision change, N/V, diarrhea, constipation, dysuria, urinary urgency or frequency, myalgias, arthralgias or rash.  Objective:   Physical Exam  Vital signs noted  Well developed well nourished female.  HEENT - Head atraumatic Normocephalic                Eyes - PERRLA, Conjuctiva - clear Sclera- Clear EOMI                Ears - EAC's Wnl TM's Wnl Gross Hearing WNL                Throat - oropharanx wnl Respiratory - Lungs CTA bilateral Cardiac - RRR S1 and S2 without murmur GI - Abdomen soft Nontender and bowel sounds active x 4 Extremities - No edema. Neuro - Grossly intact.      Assessment & Plan:  Essential hypertension Amlodipine 5mg  one po qd 90w/3rf  Lysbeth Penner FNP

## 2014-09-15 ENCOUNTER — Telehealth: Payer: Self-pay

## 2014-09-15 NOTE — Telephone Encounter (Signed)
Patient doesn't know why she is being referred to GI   Denise Fox patient

## 2014-09-15 NOTE — Telephone Encounter (Signed)
Patient was notified on 10/12 by Cyril Mourning

## 2014-11-03 ENCOUNTER — Ambulatory Visit: Payer: Medicare Other

## 2014-11-03 ENCOUNTER — Other Ambulatory Visit: Payer: Medicare Other

## 2014-11-29 ENCOUNTER — Other Ambulatory Visit: Payer: Self-pay | Admitting: *Deleted

## 2014-11-29 DIAGNOSIS — I10 Essential (primary) hypertension: Secondary | ICD-10-CM

## 2014-11-29 MED ORDER — OLMESARTAN MEDOXOMIL 40 MG PO TABS: 40.0000 mg | ORAL_TABLET | Freq: Every day | ORAL | Status: DC

## 2014-11-29 MED ORDER — METOPROLOL SUCCINATE ER 25 MG PO TB24: 25.0000 mg | ORAL_TABLET | Freq: Every day | ORAL | Status: DC

## 2014-11-29 MED ORDER — AMLODIPINE BESYLATE 5 MG PO TABS: 5.0000 mg | ORAL_TABLET | Freq: Every day | ORAL | Status: DC

## 2015-05-23 ENCOUNTER — Other Ambulatory Visit: Payer: Self-pay

## 2015-05-23 DIAGNOSIS — I10 Essential (primary) hypertension: Secondary | ICD-10-CM

## 2015-06-02 ENCOUNTER — Other Ambulatory Visit: Payer: Self-pay

## 2015-06-02 DIAGNOSIS — I10 Essential (primary) hypertension: Secondary | ICD-10-CM

## 2015-06-02 MED ORDER — METOPROLOL SUCCINATE ER 25 MG PO TB24: 25.0000 mg | ORAL_TABLET | Freq: Every day | ORAL | Status: DC

## 2015-06-02 MED ORDER — OLMESARTAN MEDOXOMIL 40 MG PO TABS: 40.0000 mg | ORAL_TABLET | Freq: Every day | ORAL | Status: DC

## 2015-06-02 NOTE — Telephone Encounter (Signed)
no more refills without being seen  

## 2015-06-02 NOTE — Telephone Encounter (Signed)
Last seen 09/13/14 [facility]  No upcoming appt scheduled

## 2015-06-27 ENCOUNTER — Telehealth: Payer: Self-pay | Admitting: Family Medicine

## 2015-06-27 DIAGNOSIS — I10 Essential (primary) hypertension: Secondary | ICD-10-CM

## 2015-06-27 MED ORDER — OLMESARTAN MEDOXOMIL 40 MG PO TABS: 40.0000 mg | ORAL_TABLET | Freq: Every day | ORAL | Status: DC

## 2015-06-27 MED ORDER — METOPROLOL SUCCINATE ER 25 MG PO TB24: 25.0000 mg | ORAL_TABLET | Freq: Every day | ORAL | Status: DC

## 2015-06-27 NOTE — Telephone Encounter (Signed)
Sent in under [facility] name since he is the supervising MD.

## 2015-09-07 ENCOUNTER — Other Ambulatory Visit: Payer: Self-pay | Admitting: Family Medicine

## 2015-09-19 ENCOUNTER — Other Ambulatory Visit: Payer: Self-pay | Admitting: Family

## 2015-09-21 ENCOUNTER — Encounter: Payer: Self-pay | Admitting: Pediatrics

## 2015-09-21 ENCOUNTER — Ambulatory Visit (INDEPENDENT_AMBULATORY_CARE_PROVIDER_SITE_OTHER): Payer: Medicare Other | Admitting: Pediatrics

## 2015-09-21 VITALS — BP 166/85 | HR 63 | Temp 97.2°F | Ht 62.0 in | Wt 179.4 lb

## 2015-09-21 DIAGNOSIS — Z23 Encounter for immunization: Secondary | ICD-10-CM | POA: Diagnosis not present

## 2015-09-21 DIAGNOSIS — M170 Bilateral primary osteoarthritis of knee: Secondary | ICD-10-CM

## 2015-09-21 DIAGNOSIS — I10 Essential (primary) hypertension: Secondary | ICD-10-CM

## 2015-09-21 MED ORDER — METOPROLOL SUCCINATE ER 25 MG PO TB24: 25.0000 mg | ORAL_TABLET | Freq: Every day | ORAL | Status: DC

## 2015-09-21 MED ORDER — DICLOFENAC SODIUM 1 % TD GEL: 2.0000 g | Freq: Four times a day (QID) | TRANSDERMAL | Status: DC

## 2015-09-21 MED ORDER — OLMESARTAN MEDOXOMIL 40 MG PO TABS: 40.0000 mg | ORAL_TABLET | Freq: Every day | ORAL | Status: DC

## 2015-09-21 NOTE — Progress Notes (Signed)
  Subjective:    Patient ID: Denise Fox, female    DOB: 11/02/1946, 69 y.o.   MRN: [id number]  CC: med refill  HPI: Denise Fox is a 69 y.o. female presenting on 09/21/2015 for Medication Refill  BPs at home usually 140s/80-85 Stressed with upcoming move Doesn't want to do any further screening for colon cancer at this time despite positive FOBT last year Did not take BP meds today prior to office visit No light headedness or dizziness  Relevant past medical, surgical, family and social history reviewed and updated as indicated. Interim medical history since our last visit reviewed. Allergies and medications reviewed and updated.   ROS: Per HPI unless specifically indicated above  Past Medical History Patient Active Problem List   Diagnosis Date Noted  . Hyperlipidemia 09/08/2014  . Osteopenia 09/08/2014  . Essential hypertension, benign 08/11/2014    Current Outpatient Prescriptions  Medication Sig Dispense Refill  . aspirin EC 325 MG tablet Take 325 mg by mouth daily.    . metoprolol succinate (TOPROL-XL) 25 MG 24 hr tablet Take 1 tablet (25 mg total) by mouth daily. 90 tablet 3  . Multiple Minerals-Vitamins (CAL MAG ZINC +D3 PO) Take 1 tablet by mouth daily.    . olmesartan (BENICAR) 40 MG tablet Take 1 tablet (40 mg total) by mouth daily. 90 tablet 3  . diclofenac sodium (VOLTAREN) 1 % GEL Apply 2 g topically 4 (four) times daily. 100 g 6   No current facility-administered medications for this visit.       Objective:    BP 166/85 mmHg  Pulse 63  Temp(Src) 97.2 F (36.2 C) (Oral)  Ht 5' 2" (1.575 m)  Wt 179 lb 6.4 oz (81.375 kg)  BMI 32.80 kg/m2  Wt Readings from Last 3 Encounters:  09/21/15 179 lb 6.4 oz (81.375 kg)  09/13/14 173 lb 6 oz (78.642 kg)  09/08/14 175 lb (79.379 kg)   Gen: NAD, alert, cooperative with exam, NCAT EYES: EOMI, no scleral injection or icterus ENT:  TMs pearly gray b/l, OP without erythema LYMPH: no cervical LAD CV: NRRR, normal  S1/S2, no murmur, distal pulses 2+ b/l Resp: CTABL, no wheezes, normal WOB Ext: No edema, warm Neuro: Alert and oriented, coordination grossly normal     Assessment & Plan:   [name] was seen today for medication refill. Had a positive FOBT one year ago, never came back in for repeat. Pt declined further FOBT cards or colonoscopy at this time. Says she doesn't think she has cancer and that she doesn't want to do any screening at this time because she is in the middle of a move. Planning to relocate to [place] in the next couple of months and find a PCP closer to there to follow with.   Diagnoses and all orders for this visit:  Essential hypertension, benign -     metoprolol succinate (TOPROL-XL) 25 MG 24 hr tablet; Take 1 tablet (25 mg total) by mouth daily. -     olmesartan (BENICAR) 40 MG tablet; Take 1 tablet (40 mg total) by mouth daily. -     BMP8+EGFR  Osteoarthritis of both knees, unspecified osteoarthritis type -     diclofenac sodium (VOLTAREN) 1 % GEL; Apply 2 g topically 4 (four) times daily.  Encounter for immunization  Other orders -     Flu Vaccine QUAD 36+ mos IM    Follow up plan: 3 months for CPE if she has not found another PCP by then    Assunta Found, MD [facility] 09/21/2015, 8:29 AM

## 2015-09-22 LAB — BMP8+EGFR
BUN / CREAT RATIO: 21 (ref 11–26)
BUN: 17 mg/dL (ref 8–27)
CHLORIDE: 102 mmol/L (ref 97–106)
CO2: 24 mmol/L (ref 18–29)
CREATININE: 0.8 mg/dL (ref 0.57–1.00)
Calcium: 10 mg/dL (ref 8.7–10.3)
GFR calc Af Amer: 86 mL/min/{1.73_m2} (ref >59)
GFR calc non Af Amer: 75 mL/min/{1.73_m2} (ref >59)
GLUCOSE: 109 mg/dL — AB (ref 65–99)
POTASSIUM: 5.1 mmol/L (ref 3.5–5.2)
SODIUM: 142 mmol/L (ref 136–144)

## 2016-06-21 ENCOUNTER — Telehealth: Payer: Self-pay | Admitting: Family Medicine

## 2016-08-07 ENCOUNTER — Encounter (HOSPITAL_COMMUNITY): Payer: Self-pay | Admitting: Emergency Medicine

## 2016-08-07 ENCOUNTER — Emergency Department (HOSPITAL_COMMUNITY): Payer: Medicare Other

## 2016-08-07 ENCOUNTER — Emergency Department (HOSPITAL_COMMUNITY)
Admission: EM | Admit: 2016-08-07 | Discharge: 2016-08-07 | Disposition: A | Discharge status: Home / Self Care | Payer: Medicare Other | Attending: Emergency Medicine | Admitting: Emergency Medicine

## 2016-08-07 DIAGNOSIS — I10 Essential (primary) hypertension: Secondary | ICD-10-CM | POA: Diagnosis not present

## 2016-08-07 DIAGNOSIS — Z7982 Long term (current) use of aspirin: Secondary | ICD-10-CM | POA: Insufficient documentation

## 2016-08-07 DIAGNOSIS — R42 Dizziness and giddiness: Secondary | ICD-10-CM | POA: Diagnosis present

## 2016-08-07 LAB — BASIC METABOLIC PANEL
Anion gap: 9 (ref 5–15)
BUN: 15 mg/dL (ref 6–20)
CHLORIDE: 109 mmol/L (ref 101–111)
CO2: 22 mmol/L (ref 22–32)
CREATININE: 0.7 mg/dL (ref 0.44–1.00)
Calcium: 9.8 mg/dL (ref 8.9–10.3)
GFR calc non Af Amer: >60 mL/min (ref >60)
Glucose, Bld: 94 mg/dL (ref 65–99)
POTASSIUM: 4.1 mmol/L (ref 3.5–5.1)
SODIUM: 140 mmol/L (ref 135–145)

## 2016-08-07 LAB — URINALYSIS, ROUTINE W REFLEX MICROSCOPIC
BILIRUBIN URINE: NEGATIVE
GLUCOSE, UA: NEGATIVE mg/dL
HGB URINE DIPSTICK: NEGATIVE
Ketones, ur: NEGATIVE mg/dL
Leukocytes, UA: NEGATIVE
Nitrite: NEGATIVE
Protein, ur: NEGATIVE mg/dL
pH: 6 (ref 5.0–8.0)

## 2016-08-07 LAB — CBC
HCT: 46.4 % — ABNORMAL HIGH (ref 36.0–46.0)
Hemoglobin: 15.4 g/dL — ABNORMAL HIGH (ref 12.0–15.0)
MCH: 29.8 pg (ref 26.0–34.0)
MCHC: 33.2 g/dL (ref 30.0–36.0)
MCV: 89.7 fL (ref 78.0–100.0)
PLATELETS: 194 10*3/uL (ref 150–400)
RBC: 5.17 MIL/uL — ABNORMAL HIGH (ref 3.87–5.11)
RDW: 14.5 % (ref 11.5–15.5)
WBC: 7.3 10*3/uL (ref 4.0–10.5)

## 2016-08-07 LAB — CBG MONITORING, ED: GLUCOSE-CAPILLARY: 101 mg/dL — AB (ref 65–99)

## 2016-08-07 MED ORDER — MECLIZINE HCL 25 MG PO TABS
25.0000 mg | ORAL_TABLET | Freq: Once | ORAL | Status: AC
  Administered 2016-08-07: 25 mg via ORAL
  Filled 2016-08-07: qty 1

## 2016-08-07 MED ORDER — MECLIZINE HCL 12.5 MG PO TABS: 12.5000 mg | ORAL_TABLET | Freq: Three times a day (TID) | ORAL | 0 refills | Status: DC | PRN

## 2016-08-07 NOTE — ED Provider Notes (Signed)
[facility] DEPT Provider Note   CSN: [id number] Arrival date & time: 08/07/16  1008     History   Chief Complaint Chief Complaint  Patient presents with  . Dizziness  . Hypertension    HPI Denise Fox is a 70 y.o. female.  Patient is a 70 year old female with a history of hypertension who presents with dizziness. She states over the last 2 weeks she's had episodes of dizziness. She feels a little bit like the room is spinning and gets little nauseated. She feels a little off balance at times. She states she's episodes usually lasts about 5 minutes but are recurrent throughout the day. She has a mild associated headache. No chest pain or shortness of breath. No palpitations. No numbness or weakness to her extremities. No speech deficits or vision changes. She has noted that her blood pressure has been more elevated over the last couple weeks than it normally is.    Dizziness  Associated symptoms: nausea   Associated symptoms: no blood in stool, no chest pain, no diarrhea, no headaches, no shortness of breath, no vomiting and no weakness   Hypertension  Pertinent negatives include no chest pain, no abdominal pain, no headaches and no shortness of breath.    Past Medical History:  Diagnosis Date  . Arthritis   . Hyperlipidemia   . Hypertension   . Prolapsing mitral valve   . Scoliosis     Patient Active Problem List   Diagnosis Date Noted  . Hyperlipidemia 09/08/2014  . Osteopenia 09/08/2014  . Essential hypertension, benign 08/11/2014    Past Surgical History:  Procedure Laterality Date  . KNEE SURGERY Bilateral 2007    OB History    No data available       Home Medications    Prior to Admission medications   Medication Sig Start Date End Date Taking? Authorizing Provider  aspirin EC 325 MG tablet Take 325 mg by mouth daily.    Historical Provider, MD  diclofenac sodium (VOLTAREN) 1 % GEL Apply 2 g topically 4 (four) times daily. 09/21/15   Eustaquio Maize, MD  meclizine (ANTIVERT) 12.5 MG tablet Take 1 tablet (12.5 mg total) by mouth 3 (three) times daily as needed for dizziness. 08/07/16   Malvin Johns, MD  metoprolol succinate (TOPROL-XL) 25 MG 24 hr tablet Take 1 tablet (25 mg total) by mouth daily. 09/21/15   Eustaquio Maize, MD  Multiple Minerals-Vitamins (CAL MAG ZINC +D3 PO) Take 1 tablet by mouth daily.    Historical Provider, MD  olmesartan (BENICAR) 40 MG tablet Take 1 tablet (40 mg total) by mouth daily. 09/21/15   Eustaquio Maize, MD    Family History Family History  Problem Relation Age of Onset  . Cancer Mother     bladder / ureater  . Hypertension Mother   . Thyroid disease Mother   . Lung disease Father   . Heart disease Father   . Glaucoma Sister     Social History Social History  Substance Use Topics  . Smoking status: Never Smoker  . Smokeless tobacco: Never Used  . Alcohol use Yes     Comment: rare     Allergies   Epinephrine; Hct [hydrochlorothiazide]; and Lisinopril   Review of Systems Review of Systems  Constitutional: Negative for chills, diaphoresis, fatigue and fever.  HENT: Negative for congestion, rhinorrhea and sneezing.   Eyes: Negative.   Respiratory: Negative for cough, chest tightness and shortness of breath.   Cardiovascular: Negative  for chest pain and leg swelling.  Gastrointestinal: Positive for nausea. Negative for abdominal pain, blood in stool, diarrhea and vomiting.  Genitourinary: Negative for difficulty urinating, flank pain, frequency and hematuria.  Musculoskeletal: Negative for arthralgias and back pain.  Skin: Negative for rash.  Neurological: Positive for dizziness. Negative for speech difficulty, weakness, numbness and headaches.     Physical Exam Updated Vital Signs BP 157/84   Pulse (!) 53   Temp 98.2 F (36.8 C) (Oral)   Resp 19   Ht 5\' 3"  (1.6 m)   Wt 175 lb (79.4 kg)   SpO2 97%   BMI 31.00 kg/m   Physical Exam  Constitutional: She is oriented to  person, place, and time. She appears well-developed and well-nourished.  HENT:  Head: Normocephalic and atraumatic.  Eyes: Pupils are equal, round, and reactive to light.  Neck: Normal range of motion. Neck supple.  Cardiovascular: Normal rate, regular rhythm and normal heart sounds.   Pulmonary/Chest: Effort normal and breath sounds normal. No respiratory distress. She has no wheezes. She has no rales. She exhibits no tenderness.  Abdominal: Soft. Bowel sounds are normal. There is no tenderness. There is no rebound and no guarding.  Musculoskeletal: Normal range of motion. She exhibits no edema.  Lymphadenopathy:    She has no cervical adenopathy.  Neurological: She is alert and oriented to person, place, and time.  Motor 5 out of 5 all extraneous, sensation grossly intact to light touch all extremity is, cranial nerves II through XII grossly intact, finger-nose intact, no pronator drift  Skin: Skin is warm and dry. No rash noted.  Psychiatric: She has a normal mood and affect.     ED Treatments / Results  Labs (all labs ordered are listed, but only abnormal results are displayed) Labs Reviewed  CBC - Abnormal; Notable for the following:       Result Value   RBC 5.17 (*)    Hemoglobin 15.4 (*)    HCT 46.4 (*)    All other components within normal limits  URINALYSIS, ROUTINE W REFLEX MICROSCOPIC (NOT AT [facility]) - Abnormal; Notable for the following:    Specific Gravity, Urine <1.005 (*)    All other components within normal limits  CBG MONITORING, ED - Abnormal; Notable for the following:    Glucose-Capillary 101 (*)    All other components within normal limits  BASIC METABOLIC PANEL    EKG  EKG Interpretation None       Radiology Mr Brain Wo Contrast  Result Date: 08/07/2016 CLINICAL DATA:  Dizziness for 2-3 weeks. Headache. Pressure in the ears and ringing in the head. Elevated blood pressure. EXAM: MRI HEAD WITHOUT CONTRAST TECHNIQUE: Multiplanar, multiecho pulse  sequences of the brain and surrounding structures were obtained without intravenous contrast. COMPARISON:  None. FINDINGS: Brain: There is no evidence of acute infarct, intracranial hemorrhage, mass, midline shift, or extra-axial fluid collection. Ventricles and sulci are normal for age. A few punctate foci of T2 hyperintensity in the cerebral white matter are nonspecific and within normal limits for age. Vascular: Major intracranial vascular flow voids are preserved. Skull and upper cervical spine: No focal osseous lesion identified. Sinuses/Orbits: Minimal left maxillary sinus mucosal thickening. Clear mastoid air cells. Unremarkable orbits. Other: None. IMPRESSION: Unremarkable appearance of the brain for age. Electronically Signed   By: Logan Bores M.D.   On: 08/07/2016 15:02    Procedures Procedures (including critical care time)  Medications Ordered in ED Medications  meclizine (ANTIVERT) tablet 25  mg (25 mg Oral Given 08/07/16 1300)     Initial Impression / Assessment and Plan / ED Course  I have reviewed the triage vital signs and the nursing notes.  Pertinent labs & imaging results that were available during my care of the patient were reviewed by me and considered in my medical decision making (see chart for details).  Clinical Course    Patient presents intermittent episodes of dizziness associated with nausea. Her blood pressure has improved without treatment in the ED. The latest is 157/84. She's neurologically intact. She ambulates without ataxia. She did have improvement of her vertigo with meclizine. MRI does not show any evidence of a posterior circulation stroke. She was discharged home in good condition. She was encouraged to follow-up with her PCP for blood pressure management. She also requests referral to cardiology. She has previously seen by cardiology but is looking for a local cardiologist. Return precautions were given.  Final Clinical Impressions(s) / ED Diagnoses    Final diagnoses:  Vertigo  Essential hypertension    New Prescriptions New Prescriptions   MECLIZINE (ANTIVERT) 12.5 MG TABLET    Take 1 tablet (12.5 mg total) by mouth 3 (three) times daily as needed for dizziness.     Malvin Johns, MD 08/07/16 [id number]

## 2016-08-07 NOTE — ED Notes (Signed)
Papers reviewed and pt. Verbalizes understanding of follow up appointments and medications

## 2016-08-07 NOTE — ED Triage Notes (Addendum)
Pt from home with c/o dizziness x 2 - 3 weeks and noticing her BP increasing.  Pt reports 200/150 BP this morning with pressure in her ears and ringing in her head.  Pt in NAD, ambulatory, A&O.

## 2016-09-09 DIAGNOSIS — R42 Dizziness and giddiness: Secondary | ICD-10-CM | POA: Insufficient documentation

## 2016-09-10 ENCOUNTER — Ambulatory Visit (INDEPENDENT_AMBULATORY_CARE_PROVIDER_SITE_OTHER): Payer: Medicare Other | Admitting: Interventional Cardiology

## 2016-09-10 ENCOUNTER — Encounter: Payer: Self-pay | Admitting: Interventional Cardiology

## 2016-09-10 ENCOUNTER — Encounter (INDEPENDENT_AMBULATORY_CARE_PROVIDER_SITE_OTHER): Payer: Self-pay

## 2016-09-10 VITALS — BP 128/80 | HR 78 | Ht 63.0 in | Wt 179.0 lb

## 2016-09-10 DIAGNOSIS — E7849 Other hyperlipidemia: Secondary | ICD-10-CM

## 2016-09-10 DIAGNOSIS — R42 Dizziness and giddiness: Secondary | ICD-10-CM

## 2016-09-10 DIAGNOSIS — E784 Other hyperlipidemia: Secondary | ICD-10-CM | POA: Diagnosis not present

## 2016-09-10 DIAGNOSIS — R0789 Other chest pain: Secondary | ICD-10-CM | POA: Diagnosis not present

## 2016-09-10 DIAGNOSIS — R9431 Abnormal electrocardiogram [ECG] [EKG]: Secondary | ICD-10-CM | POA: Diagnosis not present

## 2016-09-10 DIAGNOSIS — I10 Essential (primary) hypertension: Secondary | ICD-10-CM | POA: Diagnosis not present

## 2016-09-10 NOTE — Progress Notes (Addendum)
Cardiology Office Note    Date:  09/10/2016   ID:  Gillermina Maiers, DOB November 21, 1946, MRN [id number]  PCP:  Luetta Nutting, DO  Cardiologist: Sinclair Grooms, MD   Chief Complaint  Patient presents with  . Chest Pain  . Hyperlipidemia    History of Present Illness:  Denise Fox is a 70 y.o. female with history of dyspnea on exertion, recurring chest tightness, elevated blood pressure, and recent emergency room visit for dizziness.  She has a significant family history on both her mother and father's side of vascular cardiac and brain disease. 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 A nuclear study was done followed by her heart catheterization. At heart cath in 2001 there was no coronary disease.  She is concerned that her more recent exertional fatigue and recurring episodes of chest discomfort could represent progression to significant coronary disease given the family history, elevated blood pressure, and hyperlipidemia. She is currently asymptomatic.  Past Medical History:  Diagnosis Date  . Arthritis   . Hyperlipidemia   . Hypertension   . Prolapsing mitral valve   . Scoliosis     Past Surgical History:  Procedure Laterality Date  . KNEE SURGERY Bilateral 2007    Current Medications: Outpatient Medications Prior to Visit  Medication Sig Dispense Refill  . aspirin EC 325 MG tablet Take 325 mg by mouth daily.    . metoprolol succinate (TOPROL-XL) 25 MG 24 hr tablet Take 1 tablet (25 mg total) by mouth daily. 90 tablet 3  . Multiple Minerals-Vitamins (CAL MAG ZINC +D3 PO) Take 1 tablet by mouth daily.    Marland Kitchen olmesartan (BENICAR) 40 MG tablet Take 1 tablet (40 mg total) by mouth daily. 90 tablet 3  . diclofenac sodium (VOLTAREN) 1 % GEL Apply 2 g topically 4 (four) times daily. (Patient not taking: Reported on 09/10/2016) 100 g 6  . meclizine  (ANTIVERT) 12.5 MG tablet Take 1 tablet (12.5 mg total) by mouth 3 (three) times daily as needed for dizziness. (Patient not taking: Reported on 09/10/2016) 20 tablet 0   No facility-administered medications prior to visit.      Allergies:   Epinephrine; Hct [hydrochlorothiazide]; and Lisinopril   Social History   Social History  . Marital status: Single    Spouse name: N/A  . Number of children: N/A  . Years of education: N/A   Social History Main Topics  . Smoking status: Never Smoker  . Smokeless tobacco: Never Used  . Alcohol use Yes     Comment: rare  . Drug use: No  . Sexual activity: No   Other Topics Concern  . None   Social History Narrative  . None     Family History:  The patient's family history includes Cancer in her mother; Glaucoma in her sister; Heart disease in her father; Hypertension in her mother; Lung disease in her father; Thyroid disease in her mother.   ROS:   Please see the history of present illness.    Bilateral knee discomfort. Recently lost her husband. Has native [place] heritage. Obese with increasing weight. Sedentary lifestyle. Occasional diarrhea, back pain, difficulty with balance, joint swelling, headaches, nausea and vomiting, irregular/skipped heartbeats, excessive fatigue, PND? Hearing loss. All other systems reviewed and are negative.   PHYSICAL EXAM:   VS:  BP 128/80 (BP Location: Right Arm)   Pulse 78  Ht 5\' 3"  (1.6 m)   Wt 179 lb (81.2 kg)   BMI 31.71 kg/m    GEN: Well nourished, well developed, in no acute distress  HEENT: normal  Neck: no JVD, carotid bruits, or masses Cardiac: RRR; no murmurs, rubs, or gallops,no edema  Respiratory:  clear to auscultation bilaterally, normal work of breathing GI: soft, nontender, nondistended, + BS MS: no deformity or atrophy  Skin: warm and dry, no rash Neuro:  Alert and Oriented x 3, Strength and sensation are intact Psych: euthymic mood, full affect  Wt Readings from Last 3  Encounters:  09/10/16 179 lb (81.2 kg)  08/07/16 175 lb (79.4 kg)  09/21/15 179 lb 6.4 oz (81.4 kg)      Studies/Labs Reviewed:   EKG:  EKG  Normal sinus rhythm with with incomplete right bundle branch block, left anterior hemiblock, pseudo-infarct pattern in the precordial leads.  Recent Labs: 08/07/2016: BUN 15; Creatinine, Ser 0.70; Hemoglobin 15.4; Platelets 194; Potassium 4.1; Sodium 140   Lipid Panel    Component Value Date/Time   CHOL 235 (H) 08/11/2014 1033   TRIG 109 08/11/2014 1033   HDL 56 08/11/2014 1033   CHOLHDL 4.2 08/11/2014 1033   LDLCALC 157 (H) 08/11/2014 1033    Additional studies/ records that were reviewed today include:  Available emergency room data is evaluated. No CT scan was performed. Available EKGs are unchanged when compared.    ASSESSMENT:    1. Chest discomfort   2. Essential hypertension, benign   3. Other hyperlipidemia   4. Abnormal EKG   5. Dizziness      PLAN:  In order of problems listed above:  1. Stress Myoview to exclude CAD, assess LV function, and blood pressure response to exercise. 2. Continue current medical regimen. May need optimization. 3. Last LDL cholesterol was 157 in 2015. HDL was 52. Not treated because her primary care physician has been following this. She cannot give me any recent data. We need to control her LDL cholesterol at least to the level of 100. She and I discussed this as a target unless the nuclear study is abnormal in which case it would need to be 70. 4. 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    Medication Adjustments/Labs and Tests Ordered: Current medicines are reviewed at length with the patient today.  Concerns regarding medicines are outlined above.  Medication changes, Labs and Tests ordered today are listed in the Patient Instructions below. There are no Patient Instructions on file for this visit.   Signed, Sinclair Grooms, MD  09/10/2016 9:41 AM      [facility] [address], [place], [place]  [address] Phone: [phone]; Fax: [phone]

## 2016-09-10 NOTE — Patient Instructions (Signed)
Medication Instructions:  Your physician recommends that you continue on your current medications as directed. Please refer to the Current Medication list given to you today.   Labwork: none  Testing/Procedures: Your physician has requested that you have an exercise stress myoview. For further information please visit [email or web address]. Please follow instruction sheet, as given.   Follow-Up: Your physician recommends that you schedule a follow-up appointment as needed with Dr. Tamala Julian    Any Other Special Instructions Will Be Listed Below (If Applicable).     If you need a refill on your cardiac medications before your next appointment, please call your pharmacy.

## 2016-09-26 ENCOUNTER — Telehealth (HOSPITAL_COMMUNITY): Payer: Self-pay | Admitting: *Deleted

## 2016-09-26 NOTE — Telephone Encounter (Signed)
Left message on voicemail per DPR in reference to upcoming appointment scheduled on 10/01/16 at 0730 with detailed instructions given per Myocardial Perfusion Study Information Sheet for the test. LM to arrive 15 minutes early, and that it is imperative to arrive on time for appointment to keep from having the test rescheduled. If you need to cancel or reschedule your appointment, please call the office within 24 hours of your appointment. Failure to do so may result in a cancellation of your appointment, and a $50 no show fee. Phone number given for call back for any questions.

## 2016-09-27 ENCOUNTER — Encounter (HOSPITAL_COMMUNITY): Payer: Medicare Other

## 2016-10-01 ENCOUNTER — Encounter (INDEPENDENT_AMBULATORY_CARE_PROVIDER_SITE_OTHER): Payer: Self-pay

## 2016-10-01 ENCOUNTER — Ambulatory Visit (HOSPITAL_COMMUNITY): Payer: Medicare Other | Attending: Cardiovascular Disease

## 2016-10-01 DIAGNOSIS — R0789 Other chest pain: Secondary | ICD-10-CM

## 2016-10-01 DIAGNOSIS — I10 Essential (primary) hypertension: Secondary | ICD-10-CM | POA: Insufficient documentation

## 2016-10-01 DIAGNOSIS — R9431 Abnormal electrocardiogram [ECG] [EKG]: Secondary | ICD-10-CM

## 2016-10-01 DIAGNOSIS — R0609 Other forms of dyspnea: Secondary | ICD-10-CM | POA: Insufficient documentation

## 2016-10-01 LAB — MYOCARDIAL PERFUSION IMAGING
CHL CUP RESTING HR STRESS: 62 {beats}/min
CHL RATE OF PERCEIVED EXERTION: 19
CSEPEDS: 0 s
CSEPEW: 4.6 METS
Exercise duration (min): 3 min
LHR: 0.37
LV sys vol: 24 mL
LVDIAVOL: 80 mL (ref 46–106)
MPHR: 149 {beats}/min
Peak HR: 131 {beats}/min
Percent HR: 88 %
SDS: 3
SRS: 10
SSS: 13
TID: 1.01

## 2016-10-01 MED ORDER — TECHNETIUM TC 99M TETROFOSMIN IV KIT
31.9000 | PACK | Freq: Once | INTRAVENOUS | Status: AC | PRN
  Administered 2016-10-01: 31.9 via INTRAVENOUS
  Filled 2016-10-01: qty 32

## 2016-10-01 MED ORDER — TECHNETIUM TC 99M TETROFOSMIN IV KIT
10.1000 | PACK | Freq: Once | INTRAVENOUS | Status: AC | PRN
  Administered 2016-10-01: 10.1 via INTRAVENOUS
  Filled 2016-10-01: qty 11

## 2016-10-03 ENCOUNTER — Telehealth: Payer: Self-pay | Admitting: Interventional Cardiology

## 2016-10-03 NOTE — Telephone Encounter (Signed)
Informed pt of stress test results. Pt verbalized understanding. Pt wanted to know if Dr. Tamala Julian would order a carotid doppler based on the symptoms she was having at her OV and since the stress test was fine. Advised pt I would send message to Dr. Tamala Julian and see if he wanted to order carotids.

## 2016-10-03 NOTE — Telephone Encounter (Signed)
Follow Up:; ° ° °Returning your call. °

## 2016-10-04 NOTE — Telephone Encounter (Signed)
Advised pt of Dr Thompson Caul recommendations.  Pt verbalized understanding and was in agreement with this plan.

## 2016-10-04 NOTE — Telephone Encounter (Signed)
F/u message...   Pt Returning RN call. Please call back to discuss

## 2016-10-04 NOTE — Telephone Encounter (Signed)
The brain MRI performed in September showed good blood flow and no evidence of stroke. There was no evidence of bruit or other problem with carotid blood flow on exam.  I would defer carotid Doppler until she sees her primary physician.

## 2016-10-04 NOTE — Telephone Encounter (Signed)
Left message to call back  

## 2017-04-26 HISTORY — PX: TOTAL KNEE ARTHROPLASTY: SHX125

## 2017-06-21 ENCOUNTER — Other Ambulatory Visit: Payer: Self-pay | Admitting: Pediatrics

## 2017-06-25 NOTE — Telephone Encounter (Signed)
We havent seen her in over a year, she needs appt

## 2017-09-05 ENCOUNTER — Other Ambulatory Visit: Payer: Self-pay | Admitting: Obstetrics and Gynecology

## 2017-09-05 DIAGNOSIS — R6889 Other general symptoms and signs: Secondary | ICD-10-CM

## 2017-09-17 ENCOUNTER — Ambulatory Visit
Admission: RE | Admit: 2017-09-17 | Discharge: 2017-09-17 | Disposition: A | Discharge status: Home / Self Care | Payer: Medicare Other | Source: Ambulatory Visit | Attending: Obstetrics and Gynecology | Admitting: Obstetrics and Gynecology

## 2017-09-17 DIAGNOSIS — R6889 Other general symptoms and signs: Secondary | ICD-10-CM

## 2017-09-17 MED ORDER — GADOBENATE DIMEGLUMINE 529 MG/ML IV SOLN
16.0000 mL | Freq: Once | INTRAVENOUS | Status: AC | PRN
  Administered 2017-09-17: 16 mL via INTRAVENOUS

## 2017-09-24 ENCOUNTER — Telehealth: Payer: Self-pay | Admitting: *Deleted

## 2017-09-24 NOTE — Telephone Encounter (Signed)
Contacted the patient and gave the new patient appt for November 12th at 11:30, arrive between 11am and 11:15am.

## 2017-09-30 ENCOUNTER — Telehealth: Payer: Self-pay | Admitting: Interventional Cardiology

## 2017-09-30 NOTE — Telephone Encounter (Signed)
° °  [facility] Pre-operative Risk Assessment    Request for surgical clearance:  1. What type of surgery is being performed? Bilateral TKA  2. When is this surgery scheduled? 12/25/2017  3. Are there any medications that need to be held prior to surgery and how long? Please advise if there is any medications that need to be held and how long.   4. Practice name and name of physician performing surgery? [facility], Dr. Pilar Plate Aluisio   5. What is your office phone and fax number? Ph: [phone], Fax: [phone] ATTN: [name]  6. Anesthesia type (None, local, MAC, general) ? Not listed.    Denise Fox 09/30/2017, 4:10 PM  _________________________________________________________________   (provider comments below)

## 2017-10-03 NOTE — Telephone Encounter (Signed)
    Chart reviewed as part of pre-operative protocol coverage. Because of Denise Fox's past medical history and time since last visit, he/she will require a follow-up visit in order to better assess preoperative cardiovascular risk. She was last seen by Dr. Tamala Julian on 09/10/16. She is for bilateral TKA on 12/25/17.  Pre-op covering staff: - Please schedule appointment and call patient to inform them. - Please contact requesting surgeon's office via preferred method (i.e, phone, fax) to inform them of need for appointment prior to surgery.  Daune Perch, NP  10/03/2017, 2:22 PM

## 2017-10-03 NOTE — Telephone Encounter (Signed)
Reached out to pt re: cardiac clearance for Bilateral TKA. Pt has been made aware that she will need an o/v before she can be cleared. Pt advised me that she was just diagnosed with cancer and will be heaving a total hysterectomy, and sees Dr. Denman George 10/07/17 and will need clearance for that as well.  Pt was advised to bring any paperwork with her to the appt from Dr. Denman George, that we didn't have any information on that sx and we was calling re: her knee sx. Pt understands and will bring whatever they give her.

## 2017-10-07 ENCOUNTER — Ambulatory Visit: Payer: Medicare Other | Attending: Gynecologic Oncology | Admitting: Gynecologic Oncology

## 2017-10-07 ENCOUNTER — Encounter: Payer: Self-pay | Admitting: Gynecologic Oncology

## 2017-10-07 VITALS — BP 150/92 | HR 56 | Temp 97.6°F | Resp 18 | Ht 63.0 in | Wt 177.5 lb

## 2017-10-07 DIAGNOSIS — N859 Noninflammatory disorder of uterus, unspecified: Secondary | ICD-10-CM | POA: Diagnosis present

## 2017-10-07 DIAGNOSIS — R9389 Abnormal findings on diagnostic imaging of other specified body structures: Secondary | ICD-10-CM

## 2017-10-07 DIAGNOSIS — C541 Malignant neoplasm of endometrium: Secondary | ICD-10-CM

## 2017-10-07 DIAGNOSIS — N9489 Other specified conditions associated with female genital organs and menstrual cycle: Secondary | ICD-10-CM | POA: Insufficient documentation

## 2017-10-07 NOTE — Patient Instructions (Addendum)
Preparing for your Surgery  Plan for surgery on October 29, 2017 with Dr. Everitt Amber at [place] will be scheduled for a robotic assisted total hysterectomy, bilateral salpingo-oophorectomy, sentinel lymph node biopsy.  Pre-operative Testing -You will receive a phone call from presurgical testing at [facility] to arrange for a pre-operative testing appointment before your surgery.  This appointment normally occurs one to two weeks before your scheduled surgery.   -Bring your insurance card, copy of an advanced directive if applicable, medication list  -At that visit, you will be asked to sign a consent for a possible blood transfusion in case a transfusion becomes necessary during surgery.  The need for a blood transfusion is rare but having consent is a necessary part of your care.     -You should not be taking blood thinners or aspirin at least ten days prior to surgery unless instructed by your surgeon.  Day Before Surgery at [facility] will be asked to take in a light diet the day before surgery.  Avoid carbonated beverages.  You will be advised to have nothing to eat or drink after midnight the evening before.    Eat a light diet the day before surgery.  Examples including soups, broths, toast, yogurt, mashed potatoes.  Things to avoid include carbonated beverages  (fizzy beverages), raw fruits and raw vegetables, or beans.   If your bowels are filled with gas, your surgeon will have difficulty visualizing your pelvic organs which increases your surgical risks.  Your role in recovery Your role is to become active as soon as directed by your doctor, while still giving yourself time to heal.  Rest when you feel tired. You will be asked to do the following in order to speed your recovery:  - Cough and breathe deeply. This helps toclear and expand your lungs and can prevent pneumonia. You may be given a spirometer to practice deep breathing. A staff  member will show you how to use the spirometer. - Do mild physical activity. Walking or moving your legs help your circulation and body functions return to normal. A staff member will help you when you try to walk and will provide you with simple exercises. Do not try to get up or walk alone the first time. - Actively manage your pain. Managing your pain lets you move in comfort. We will ask you to rate your pain on a scale of zero to 10. It is your responsibility to tell your doctor or nurse where and how much you hurt so your pain can be treated.  Special Considerations -If you are diabetic, you may be placed on insulin after surgery to have closer control over your blood sugars to promote healing and recovery.  This does not mean that you will be discharged on insulin.  If applicable, your oral antidiabetics will be resumed when you are tolerating a solid diet.  -Your final pathology results from surgery should be available by the Friday after surgery and the results will be relayed to you when available.  -Dr. Lahoma Crocker is the Surgeon that assists your GYN Oncologist with surgery.  The next day after your surgery you will either see your GYN Oncologist or Dr. Lahoma Crocker.   Blood Transfusion Information WHAT IS A BLOOD TRANSFUSION? A transfusion is the replacement of blood or some of its parts. Blood is made up of multiple cells which provide different functions.  Red blood cells carry oxygen and are used for  blood loss replacement.  White blood cells fight against infection.  Platelets control bleeding.  Plasma helps clot blood.  Other blood products are available for specialized needs, such as hemophilia or other clotting disorders. BEFORE THE TRANSFUSION  Who gives blood for transfusions?   You may be able to donate blood to be used at a later date on yourself (autologous donation).  Relatives can be asked to donate blood. This is generally not any safer than if  you have received blood from a stranger. The same precautions are taken to ensure safety when a relative's blood is donated.  Healthy volunteers who are fully evaluated to make sure their blood is safe. This is blood bank blood. Transfusion therapy is the safest it has ever been in the practice of medicine. Before blood is taken from a donor, a complete history is taken to make sure that person has no history of diseases nor engages in risky social behavior (examples are intravenous drug use or sexual activity with multiple partners). The donor's travel history is screened to minimize risk of transmitting infections, such as malaria. The donated blood is tested for signs of infectious diseases, such as HIV and hepatitis. The blood is then tested to be sure it is compatible with you in order to minimize the chance of a transfusion reaction. If you or a relative donates blood, this is often done in anticipation of surgery and is not appropriate for emergency situations. It takes many days to process the donated blood. RISKS AND COMPLICATIONS Although transfusion therapy is very safe and saves many lives, the main dangers of transfusion include:   Getting an infectious disease.  Developing a transfusion reaction. This is an allergic reaction to something in the blood you were given. Every precaution is taken to prevent this. The decision to have a blood transfusion has been considered carefully by your caregiver before blood is given. Blood is not given unless the benefits outweigh the risks.

## 2017-10-07 NOTE — Progress Notes (Signed)
Consult Note: Gyn-Onc  Consult was requested by Dr. Leo Grosser for the evaluation of Denise Fox 71 y.o. female  CC:  Chief Complaint  Patient presents with  . Endometrial mass    Assessment/Plan:  Ms. Denise Fox  is a 71 y.o.  year old with endometrial cancer (grade 1).   A detailed discussion was held with the patient and her family with regard to to her endometrial cancer diagnosis. We discussed the standard management options for uterine cancer which includes surgery followed possibly by adjuvant therapy depending on the results of surgery. The options for surgical management include a hysterectomy and removal of the tubes and ovaries possibly with removal of pelvic and para-aortic lymph nodes.If feasible, a minimally invasive approach including a robotic hysterectomy or laparoscopic hysterectomy have benefits including shorter hospital stay, recovery time and better wound healing than with open surgery. The patient has been counseled about these surgical options and the risks of surgery in general including infection, bleeding, damage to surrounding structures (including bowel, bladder, ureters, nerves or vessels), and the postoperative risks of PE/ DVT, and lymphedema. I extensively reviewed the additional risks of robotic hysterectomy including possible need for conversion to open laparotomy.  I discussed positioning during surgery of trendelenberg and risks of minor facial swelling and care we take in preoperative positioning.  After counseling and consideration of her options, she desires to proceed with robotic assisted total hysterectomy with bilateral sapingo-oophorectomy and SLN biopsy. She may require minilap for specimen delivery.  She will be seen by anesthesia for preoperative clearance and discussion of postoperative pain management.  She was given the opportunity to ask questions, which were answered to her satisfaction, and she is agreement with the above mentioned plan of  care.   HPI: Denise Fox is a 71 year old woman who is seen in consultation at the request of Dr Leo Grosser for a thickened endometrium on MRI.  She was experiencing abdominal pains in August, 2018. This was worked up with an [place] by [employer] on 08/01/17 which showed non calcified gallstones and abnormal uterine contents.  She then followed up with Dr Leo Grosser on 08/21/17 who evaluated her with biopsy which expressed a large volume of endometrial fluid. Final pathology was indeterminant because it contained only mucoid material with abundant degenerating old red blood cells. No intact endometrial tissue was seen.  She was offered [facility] but declined this because she is nervous about anesthesia due to a history of postoperative apneic events.   Instead she elected for MRI of the pelvis which took place on 09/17/17 which showed a retroverted uterus with thickened endometrium and heterogeneously hypoenhancing. There were multiple uterine fibroids measuring <2cm. No free fluid or adenopathy was seen.   Endometrial biopsy from our office on 10/07/17 showed grade 1 endometrioid endometrial cancer.   Current Meds:  Outpatient Encounter Medications as of 10/07/2017  Medication Sig  . diclofenac sodium (VOLTAREN) 1 % GEL Apply 2 g 4 (four) times daily as needed topically (for knee/hand pain).   . metoprolol succinate (TOPROL-XL) 25 MG 24 hr tablet Take 1 tablet (25 mg total) by mouth daily.  Marland Kitchen olmesartan (BENICAR) 40 MG tablet Take 1 tablet (40 mg total) by mouth daily.  . [DISCONTINUED] Multiple Minerals-Vitamins (CAL MAG ZINC +D3 PO) Take 1 tablet by mouth daily.  . [DISCONTINUED] aspirin EC 325 MG tablet Take 325 mg by mouth daily.   No facility-administered encounter medications on file as of 10/07/2017.     Allergy:  Allergies  Allergen  Reactions  . Bee Venom Anaphylaxis  . Epinephrine Other (See Comments) and Hypertension    Elevated BP/shaking/sickness.  . Atorvastatin Other (See Comments)     Unknown  . Other Other (See Comments)    Perfume - headache  . Hct [Hydrochlorothiazide] Anxiety  . Lisinopril Anxiety    Social Hx:   Social History   Socioeconomic History  . Marital status: Single    Spouse name: Not on file  . Number of children: Not on file  . Years of education: Not on file  . Highest education level: Not on file  Social Needs  . Financial resource strain: Not on file  . Food insecurity - worry: Not on file  . Food insecurity - inability: Not on file  . Transportation needs - medical: Not on file  . Transportation needs - non-medical: Not on file  Occupational History  . Not on file  Tobacco Use  . Smoking status: Never Smoker  . Smokeless tobacco: Never Used  Substance and Sexual Activity  . Alcohol use: Yes    Comment: rare  . Drug use: No  . Sexual activity: No  Other Topics Concern  . Not on file  Social History Narrative  . Not on file    Past Surgical Hx:  Past Surgical History:  Procedure Laterality Date  . KNEE SURGERY Bilateral 2007    Past Medical Hx:  Past Medical History:  Diagnosis Date  . Arthritis   . Hyperlipidemia   . Hypertension   . Myocardial infarction ([place]) 2001   no stents placed  . Prolapsing mitral valve   . Right bundle branch block   . Scoliosis     Past Gynecological History:  SVDx 1 No LMP recorded. Patient is postmenopausal.  Family Hx:  Family History  Problem Relation Age of Onset  . Cancer Mother        bladder / ureater  . Hypertension Mother   . Thyroid disease Mother   . Lung disease Father   . Heart disease Father   . Glaucoma Sister     Review of Systems:  Constitutional  Feels well,    ENT Normal appearing ears and nares bilaterally Skin/Breast  No rash, sores, jaundice, itching, dryness Cardiovascular  No chest pain, shortness of breath, or edema  Pulmonary  No cough or wheeze.  Gastro Intestinal  No nausea, vomitting, or diarrhoea. No bright red blood per rectum, no  abdominal pain, change in bowel movement, or constipation.  Genito Urinary  No frequency, urgency, dysuria, + drainage of fluid after biopsy attempt Musculo Skeletal  No myalgia, arthralgia, joint swelling or pain  Neurologic  No weakness, numbness, change in gait,  Psychology  No depression, anxiety, insomnia.   Vitals:  Blood pressure (!) 150/92, pulse (!) 56, temperature 97.6 F (36.4 C), temperature source Oral, resp. rate 18, height 5\' 3"  (1.6 m), weight 177 lb 8 oz (80.5 kg), SpO2 98 %.  Physical Exam: WD in NAD Neck  Supple NROM, without any enlargements.  Lymph Node Survey No cervical supraclavicular or inguinal adenopathy Cardiovascular  Pulse normal rate, regularity and rhythm. S1 and S2 normal.  Lungs  Clear to auscultation bilateraly, without wheezes/crackles/rhonchi. Good air movement.  Skin  No rash/lesions/breakdown  Psychiatry  Alert and oriented to person, place, and time  Abdomen  Normoactive bowel sounds, abdomen soft, non-tender and nonobese without evidence of hernia.  Back No CVA tenderness Genito Urinary  Vulva/vagina: Normal external female genitalia.   No lesions.  No discharge or bleeding.  Bladder/urethra:  No lesions or masses, well supported bladder  Vagina: normal  Cervix: Normal appearing, no lesions.  Uterus:  Bulky, mobile, no parametrial involvement or nodularity.  Adnexa: no discrete masses. Rectal  deferred Extremities  No bilateral cyanosis, clubbing or edema.  PROCEDURE NOTE: ENDOMETRIAL BIOPSY preop dx: endometrial thickening Postop dx: same Surgeon: Everitt Amber Complications: none EBL: minimal Pathology: endometrial biopsy Procedure: cervix was visualized and grasped with tenaculum. Cervical os was stenotic and therefore os finder was used to dilated the os. This allowed for passage of the pipelle once to a depth of 8cm. Small amount of material was retrieved.  Patient tolerated the procedure well.   Donaciano Eva, MD   10/16/2017, 4:12 PM

## 2017-10-07 NOTE — H&P (View-Only) (Signed)
Consult Note: Gyn-Onc  Consult was requested by Dr. Leo Grosser for the evaluation of Denise Fox 71 y.o. female  CC:  Chief Complaint  Patient presents with  . Endometrial mass    Assessment/Plan:  Ms. Denise Fox  is a 71 y.o.  year old with endometrial cancer (grade 1).   A detailed discussion was held with the patient and her family with regard to to her endometrial cancer diagnosis. We discussed the standard management options for uterine cancer which includes surgery followed possibly by adjuvant therapy depending on the results of surgery. The options for surgical management include a hysterectomy and removal of the tubes and ovaries possibly with removal of pelvic and para-aortic lymph nodes.If feasible, a minimally invasive approach including a robotic hysterectomy or laparoscopic hysterectomy have benefits including shorter hospital stay, recovery time and better wound healing than with open surgery. The patient has been counseled about these surgical options and the risks of surgery in general including infection, bleeding, damage to surrounding structures (including bowel, bladder, ureters, nerves or vessels), and the postoperative risks of PE/ DVT, and lymphedema. I extensively reviewed the additional risks of robotic hysterectomy including possible need for conversion to open laparotomy.  I discussed positioning during surgery of trendelenberg and risks of minor facial swelling and care we take in preoperative positioning.  After counseling and consideration of her options, she desires to proceed with robotic assisted total hysterectomy with bilateral sapingo-oophorectomy and SLN biopsy. She may require minilap for specimen delivery.  She will be seen by anesthesia for preoperative clearance and discussion of postoperative pain management.  She was given the opportunity to ask questions, which were answered to her satisfaction, and she is agreement with the above mentioned plan of  care.   HPI: Denise Fox is a 71 year old woman who is seen in consultation at the request of Dr Leo Grosser for a thickened endometrium on MRI.  She was experiencing abdominal pains in August, 2018. This was worked up with an [place] by [employer] on 08/01/17 which showed non calcified gallstones and abnormal uterine contents.  She then followed up with Dr Leo Grosser on 08/21/17 who evaluated her with biopsy which expressed a large volume of endometrial fluid. Final pathology was indeterminant because it contained only mucoid material with abundant degenerating old red blood cells. No intact endometrial tissue was seen.  She was offered [facility] but declined this because she is nervous about anesthesia due to a history of postoperative apneic events.   Instead she elected for MRI of the pelvis which took place on 09/17/17 which showed a retroverted uterus with thickened endometrium and heterogeneously hypoenhancing. There were multiple uterine fibroids measuring <2cm. No free fluid or adenopathy was seen.   Endometrial biopsy from our office on 10/07/17 showed grade 1 endometrioid endometrial cancer.   Current Meds:  Outpatient Encounter Medications as of 10/07/2017  Medication Sig  . diclofenac sodium (VOLTAREN) 1 % GEL Apply 2 g 4 (four) times daily as needed topically (for knee/hand pain).   . metoprolol succinate (TOPROL-XL) 25 MG 24 hr tablet Take 1 tablet (25 mg total) by mouth daily.  Marland Kitchen olmesartan (BENICAR) 40 MG tablet Take 1 tablet (40 mg total) by mouth daily.  . [DISCONTINUED] Multiple Minerals-Vitamins (CAL MAG ZINC +D3 PO) Take 1 tablet by mouth daily.  . [DISCONTINUED] aspirin EC 325 MG tablet Take 325 mg by mouth daily.   No facility-administered encounter medications on file as of 10/07/2017.     Allergy:  Allergies  Allergen  Reactions  . Bee Venom Anaphylaxis  . Epinephrine Other (See Comments) and Hypertension    Elevated BP/shaking/sickness.  . Atorvastatin Other (See Comments)     Unknown  . Other Other (See Comments)    Perfume - headache  . Hct [Hydrochlorothiazide] Anxiety  . Lisinopril Anxiety    Social Hx:   Social History   Socioeconomic History  . Marital status: Single    Spouse name: Not on file  . Number of children: Not on file  . Years of education: Not on file  . Highest education level: Not on file  Social Needs  . Financial resource strain: Not on file  . Food insecurity - worry: Not on file  . Food insecurity - inability: Not on file  . Transportation needs - medical: Not on file  . Transportation needs - non-medical: Not on file  Occupational History  . Not on file  Tobacco Use  . Smoking status: Never Smoker  . Smokeless tobacco: Never Used  Substance and Sexual Activity  . Alcohol use: Yes    Comment: rare  . Drug use: No  . Sexual activity: No  Other Topics Concern  . Not on file  Social History Narrative  . Not on file    Past Surgical Hx:  Past Surgical History:  Procedure Laterality Date  . KNEE SURGERY Bilateral 2007    Past Medical Hx:  Past Medical History:  Diagnosis Date  . Arthritis   . Hyperlipidemia   . Hypertension   . Myocardial infarction ([place]) 2001   no stents placed  . Prolapsing mitral valve   . Right bundle branch block   . Scoliosis     Past Gynecological History:  SVDx 1 No LMP recorded. Patient is postmenopausal.  Family Hx:  Family History  Problem Relation Age of Onset  . Cancer Mother        bladder / ureater  . Hypertension Mother   . Thyroid disease Mother   . Lung disease Father   . Heart disease Father   . Glaucoma Sister     Review of Systems:  Constitutional  Feels well,    ENT Normal appearing ears and nares bilaterally Skin/Breast  No rash, sores, jaundice, itching, dryness Cardiovascular  No chest pain, shortness of breath, or edema  Pulmonary  No cough or wheeze.  Gastro Intestinal  No nausea, vomitting, or diarrhoea. No bright red blood per rectum, no  abdominal pain, change in bowel movement, or constipation.  Genito Urinary  No frequency, urgency, dysuria, + drainage of fluid after biopsy attempt Musculo Skeletal  No myalgia, arthralgia, joint swelling or pain  Neurologic  No weakness, numbness, change in gait,  Psychology  No depression, anxiety, insomnia.   Vitals:  Blood pressure (!) 150/92, pulse (!) 56, temperature 97.6 F (36.4 C), temperature source Oral, resp. rate 18, height 5\' 3"  (1.6 m), weight 177 lb 8 oz (80.5 kg), SpO2 98 %.  Physical Exam: WD in NAD Neck  Supple NROM, without any enlargements.  Lymph Node Survey No cervical supraclavicular or inguinal adenopathy Cardiovascular  Pulse normal rate, regularity and rhythm. S1 and S2 normal.  Lungs  Clear to auscultation bilateraly, without wheezes/crackles/rhonchi. Good air movement.  Skin  No rash/lesions/breakdown  Psychiatry  Alert and oriented to person, place, and time  Abdomen  Normoactive bowel sounds, abdomen soft, non-tender and nonobese without evidence of hernia.  Back No CVA tenderness Genito Urinary  Vulva/vagina: Normal external female genitalia.   No lesions.  No discharge or bleeding.  Bladder/urethra:  No lesions or masses, well supported bladder  Vagina: normal  Cervix: Normal appearing, no lesions.  Uterus:  Bulky, mobile, no parametrial involvement or nodularity.  Adnexa: no discrete masses. Rectal  deferred Extremities  No bilateral cyanosis, clubbing or edema.  PROCEDURE NOTE: ENDOMETRIAL BIOPSY preop dx: endometrial thickening Postop dx: same Surgeon: Everitt Amber Complications: none EBL: minimal Pathology: endometrial biopsy Procedure: cervix was visualized and grasped with tenaculum. Cervical os was stenotic and therefore os finder was used to dilated the os. This allowed for passage of the pipelle once to a depth of 8cm. Small amount of material was retrieved.  Patient tolerated the procedure well.   Donaciano Eva, MD   10/16/2017, 4:12 PM

## 2017-10-08 ENCOUNTER — Ambulatory Visit: Payer: Medicare Other | Admitting: Cardiology

## 2017-10-08 ENCOUNTER — Encounter: Payer: Self-pay | Admitting: Gynecologic Oncology

## 2017-10-08 ENCOUNTER — Encounter (INDEPENDENT_AMBULATORY_CARE_PROVIDER_SITE_OTHER): Payer: Self-pay

## 2017-10-08 ENCOUNTER — Encounter: Payer: Self-pay | Admitting: Cardiology

## 2017-10-08 ENCOUNTER — Telehealth: Payer: Self-pay | Admitting: Gynecologic Oncology

## 2017-10-08 VITALS — BP 148/84 | HR 66 | Resp 16 | Ht 63.0 in | Wt 177.0 lb

## 2017-10-08 DIAGNOSIS — I10 Essential (primary) hypertension: Secondary | ICD-10-CM

## 2017-10-08 DIAGNOSIS — Z8249 Family history of ischemic heart disease and other diseases of the circulatory system: Secondary | ICD-10-CM | POA: Diagnosis not present

## 2017-10-08 DIAGNOSIS — R9431 Abnormal electrocardiogram [ECG] [EKG]: Secondary | ICD-10-CM | POA: Diagnosis not present

## 2017-10-08 DIAGNOSIS — Z01818 Encounter for other preprocedural examination: Secondary | ICD-10-CM

## 2017-10-08 DIAGNOSIS — Z01810 Encounter for preprocedural cardiovascular examination: Secondary | ICD-10-CM

## 2017-10-08 NOTE — Patient Instructions (Addendum)
Medication Instructions:  Your physician recommends that you continue on your current medications as directed. Please refer to the Current Medication list given to you today.  Labwork: None   Testing/Procedures: None   Follow-Up: Your physician wants you to follow-up in: 6 months with Dr Tamala Julian. You will receive a reminder letter in the mail two months in advance. If you don't receive a letter, please call our office to schedule the follow-up appointment.  Any Other Special Instructions Will Be Listed Below (If Applicable).  You have been cleared for surgery and we will send the clearance to the physician.   If you need a refill on your cardiac medications before your next appointment, please call your pharmacy.

## 2017-10-08 NOTE — Progress Notes (Signed)
What is PCRI risk? Cant tell from your note.  Overall, based on data provided, she is low risk and should be cleared.

## 2017-10-08 NOTE — Progress Notes (Addendum)
Cardiology Office Note   Date:  10/08/2017   ID:  Denise Fox, DOB 27-Feb-1946, MRN [id number]  PCP:  Lauraine Rinne, MD  Cardiologist:  Dr. Tamala Julian     Chief Complaint  Patient presents with  . Medical Clearance      History of Present Illness: Denise Fox is a 71 y.o. female who presents for pre-op exam for hysterectomy by Dr. Denman George, due to cancer.  Also she need Bilateral TKA.     She has a history of dyspnea on exertion, recurring chest tightness, elevated blood pressure, and recent emergency room visit for dizziness.  She has a significant family history on both her mother and father's side of vascular cardiac and brain disease. 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 A nuclear study was done followed by her heart catheterization. At heart cath in 2001 there was no coronary disease.  Last nuc was 09/2016 and EF 70%, no wall motion abnormalities noted.  Low risk study.    Today she has been diagnosed with uterine cancer and needs total hysterectomy.  This is scheduled for 10/29/17 and then Dec 25 2017 she will need bil. Knee replacements for arthritis and knee pain with decreasing activity.   She has no chest pain or SOB.  Rare palpitation but this has not changed in years.   She is able to vacuum at home, and just climbed ladder and cleaned out her gutters, all without SOB or chest pain.     She was also seen by Dr. Audie Clear -nephrology for renal angiolipoma. She has seen [facility] and had EGD and colonoscopy at the end of Oct. Without problems neg for H. Pylori, or Barretts, chronic inflammation noted in stomach and esophagus.  Prilosec ordered.       Past Medical History:  Diagnosis Date  . Arthritis   . Hyperlipidemia   . Hypertension   . Prolapsing mitral valve   . Scoliosis     Past Surgical History:  Procedure Laterality Date  . KNEE SURGERY  Bilateral 2007     Current Outpatient Medications  Medication Sig Dispense Refill  . diclofenac sodium (VOLTAREN) 1 % GEL Apply 2 g topically 4 (four) times daily as needed (knee pain).    . metoprolol succinate (TOPROL-XL) 25 MG 24 hr tablet Take 1 tablet (25 mg total) by mouth daily. 90 tablet 3  . Multiple Minerals-Vitamins (CAL MAG ZINC +D3 PO) Take 1 tablet by mouth daily.    Marland Kitchen olmesartan (BENICAR) 40 MG tablet Take 1 tablet (40 mg total) by mouth daily. 90 tablet 3   No current facility-administered medications for this visit.     Allergies:   Bee venom; Epinephrine; Atorvastatin; Hct [hydrochlorothiazide]; and Lisinopril    Social History:  The patient  reports that  has never smoked. she has never used smokeless tobacco. She reports that she drinks alcohol. She reports that she does not use drugs.   Family History:  The patient's family history includes Cancer in her mother; Glaucoma in her sister; Heart disease in her father; Hypertension in her mother; Lung disease in her father; Thyroid disease in her mother.    ROS:  General:no colds or fevers, no weight changes Skin:no rashes or ulcers HEENT:no blurred vision, no congestion CV:see HPI PUL:see HPI GI:no diarrhea constipation or melena, no indigestion GU:no hematuria, no dysuria MS:no joint pain, no claudication Neuro:no syncope,  no lightheadedness Endo:no diabetes, no thyroid disease  Wt Readings from Last 3 Encounters:  10/08/17 177 lb (80.3 kg)  10/07/17 177 lb 8 oz (80.5 kg)  10/01/16 179 lb (81.2 kg)     PHYSICAL EXAM: VS:  BP (!) 148/84   Pulse 66   Resp 16   Ht 5\' 3"  (1.6 m)   Wt 177 lb (80.3 kg)   SpO2 96%   BMI 31.35 kg/m  , BMI Body mass index is 31.35 kg/m. General:Pleasant affect, NAD Skin:Warm and dry, brisk capillary refill HEENT:normocephalic, sclera clear, mucus membranes moist Neck:supple, no JVD, no bruits  Heart:S1S2 RRR without murmur, gallup, rub or click Lungs:clear without rales,  rhonchi, or wheezes PJK:DTOI, non tender, + BS, do not palpate liver spleen or masses Ext:no lower ext edema, 2+ pedal pulses, 2+ radial pulses Neuro:alert and oriented X 3, MAE, follows commands, + facial symmetry    EKG:  EKG is ordered today. The ekg ordered today demonstrates SR incomplete RBBB and LAFB, no changes from previous EKG.     Recent Labs: No results found for requested labs within last 8760 hours.    Lipid Panel    Component Value Date/Time   CHOL 235 (H) 08/11/2014 1033   TRIG 109 08/11/2014 1033   HDL 56 08/11/2014 1033   CHOLHDL 4.2 08/11/2014 1033   LDLCALC 157 (H) 08/11/2014 1033       Other studies Reviewed: Additional studies/ records that were reviewed today include:  Exercise myoview  10/01/16. Study Highlights     Nuclear stress EF: 70%. No wall motion abnormalities noted  There was no ST segment deviation noted during stress.  This is a low risk study. No perfusion defects at stress.   Candee Furbish, MD      ASSESSMENT AND PLAN:  1.  Pre-op exam for total hysterectomy for uterine cancer. To be done 10/29/17 and bilateral total knees 12/25/17.  Pt without known CAD by cath 2001 and neg nuc for ischemia 10/01/16.  EF has been normal.  Per ACC  Guidelines pt RCRI risk for major cardiac event.  I will send to Dr. Tamala Julian as well to see if agrees.  Dr. Tamala Julian does agree low risk for surgery.  She will notify us if any cardiac issues between hysterectomy and total knees.  2.   HLD followed by PCP  3.    Essential HTN up today but still recovering from procedure yesterday.  4.    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      Current medicines are reviewed with the patient today.  The patient Has no concerns regarding medicines.  The following changes have been made:  See above Labs/ tests ordered today include:see above  Disposition:   FU:  see above  Signed, Cecilie Kicks, NP  10/08/2017 12:08 PM    [address], [place], [place], [place] Phone: [phone]; Fax: [phone]

## 2017-10-08 NOTE — Telephone Encounter (Signed)
Advised patient to stop taking Vitamin E but she states she does not take it at this time.  Also, to follow up with her questions voiced yesterday, patient advised that donating her own blood to have available for surgery could pose a higher risk of complications per Dr. Denman George.  Need for blood transfusion extremely low.  Patient agreeable.  We will contact her with the results of her biopsies from yesterday.

## 2017-10-09 ENCOUNTER — Telehealth: Payer: Self-pay | Admitting: Gynecologic Oncology

## 2017-10-09 NOTE — Telephone Encounter (Signed)
Informed patient of her diagnosis of endometrial cancer. Recommendation is for hysterectomy and staging as planned.

## 2017-10-16 ENCOUNTER — Encounter: Payer: Self-pay | Admitting: Gynecologic Oncology

## 2017-10-23 NOTE — Patient Instructions (Addendum)
Denise Fox  10/23/2017   Your procedure is scheduled on: 10-29-17  Report to [facility] Main  Entrance Take [place]  Elevators to 3rd floor to  [place] at 7:30 AM.   Call this number if you have problems the morning of surgery [phone]    Remember: ONLY 1 PERSON MAY GO WITH YOU TO SHORT STAY TO GET  READY MORNING OF [place].  Do not eat food or drink liquids :After Midnight.   Eat a light diet the day before surgery.  Examples including soups, broths, toast, yogurt, mashed potatoes.  Things to avoid include carbonated beverages (fizzy beverages), raw fruits and raw vegetables, or beans.    If your bowels are filled with gas, your surgeon will have difficulty visualizing your pelvic organs which increases your surgical risks.   Take these medicines the morning of surgery with A SIP OF WATER: Metoprolol Succinate (Toprol-XL)                                You may not have any metal on your body including hair pins and              piercings  Do not wear jewelry, make-up, lotions, powders or perfumes, deodorant             Do not wear nail polish.  Do not shave  48 hours prior to surgery.                 Do not bring valuables to the hospital. [place].  Contacts, dentures or bridgework may not be worn into surgery.  Leave suitcase in the car. After surgery it may be brought to your room.                  Please read over the following fact sheets you were given: _____________________________________________________________________             [facility] - Preparing for Surgery Before surgery, you can play an important role.  Because skin is not sterile, your skin needs to be as free of germs as possible.  You can reduce the number of germs on your skin by washing with CHG (chlorahexidine gluconate) soap before surgery.  CHG is an antiseptic cleaner which kills germs and bonds with the skin  to continue killing germs even after washing. Please DO NOT use if you have an allergy to CHG or antibacterial soaps.  If your skin becomes reddened/irritated stop using the CHG and inform your nurse when you arrive at Short Stay. Do not shave (including legs and underarms) for at least 48 hours prior to the first CHG shower.  You may shave your face/neck. Please follow these instructions carefully:  1.  Shower with CHG Soap the night before surgery and the  morning of Surgery.  2.  If you choose to wash your hair, wash your hair first as usual with your  normal  shampoo.  3.  After you shampoo, rinse your hair and body thoroughly to remove the  shampoo.                           4.  Use CHG  as you would any other liquid soap.  You can apply chg directly  to the skin and wash                       Gently with a scrungie or clean washcloth.  5.  Apply the CHG Soap to your body ONLY FROM THE NECK DOWN.   Do not use on face/ open                           Wound or open sores. Avoid contact with eyes, ears mouth and genitals (private parts).                       Wash face,  Genitals (private parts) with your normal soap.             6.  Wash thoroughly, paying special attention to the area where your surgery  will be performed.  7.  Thoroughly rinse your body with warm water from the neck down.  8.  DO NOT shower/wash with your normal soap after using and rinsing off  the CHG Soap.                9.  Pat yourself dry with a clean towel.            10.  Wear clean pajamas.            11.  Place clean sheets on your bed the night of your first shower and do not  sleep with pets. Day of Surgery : Do not apply any lotions/deodorants the morning of surgery.  Please wear clean clothes to the hospital/surgery center.  FAILURE TO FOLLOW THESE INSTRUCTIONS MAY RESULT IN THE CANCELLATION OF YOUR SURGERY PATIENT SIGNATURE_________________________________  NURSE  SIGNATURE__________________________________  ________________________________________________________________________   Adam Phenix  An incentive spirometer is a tool that can help keep your lungs clear and active. This tool measures how well you are filling your lungs with each breath. Taking long deep breaths may help reverse or decrease the chance of developing breathing (pulmonary) problems (especially infection) following:  A long period of time when you are unable to move or be active. BEFORE THE PROCEDURE   If the spirometer includes an indicator to show your best effort, your nurse or respiratory therapist will set it to a desired goal.  If possible, sit up straight or lean slightly forward. Try not to slouch.  Hold the incentive spirometer in an upright position. INSTRUCTIONS FOR USE  1. Sit on the edge of your bed if possible, or sit up as far as you can in bed or on a chair. 2. Hold the incentive spirometer in an upright position. 3. Breathe out normally. 4. Place the mouthpiece in your mouth and seal your lips tightly around it. 5. Breathe in slowly and as deeply as possible, raising the piston or the ball toward the top of the column. 6. Hold your breath for 3-5 seconds or for as long as possible. Allow the piston or ball to fall to the bottom of the column. 7. Remove the mouthpiece from your mouth and breathe out normally. 8. Rest for a few seconds and repeat Steps 1 through 7 at least 10 times every 1-2 hours when you are awake. Take your time and take a few normal breaths between deep breaths. 9. The spirometer may include an indicator to show your best effort. Use  the indicator as a goal to work toward during each repetition. 10. After each set of 10 deep breaths, practice coughing to be sure your lungs are clear. If you have an incision (the cut made at the time of surgery), support your incision when coughing by placing a pillow or rolled up towels firmly  against it. Once you are able to get out of bed, walk around indoors and cough well. You may stop using the incentive spirometer when instructed by your caregiver.  RISKS AND COMPLICATIONS  Take your time so you do not get dizzy or light-headed.  If you are in pain, you may need to take or ask for pain medication before doing incentive spirometry. It is harder to take a deep breath if you are having pain. AFTER USE  Rest and breathe slowly and easily.  It can be helpful to keep track of a log of your progress. Your caregiver can provide you with a simple table to help with this. If you are using the spirometer at home, follow these instructions: [place] IF:   You are having difficultly using the spirometer.  You have trouble using the spirometer as often as instructed.  Your pain medication is not giving enough relief while using the spirometer.  You develop fever of 100.5 F (38.1 C) or higher. SEEK IMMEDIATE MEDICAL CARE IF:   You cough up bloody sputum that had not been present before.  You develop fever of 102 F (38.9 C) or greater.  You develop worsening pain at or near the incision site. MAKE SURE YOU:   Understand these instructions.  Will watch your condition.  Will get help right away if you are not doing well or get worse. Document Released: 03/25/2007 Document Revised: 02/04/2012 Document Reviewed: 05/26/2007 ExitCare Patient Information 2014 ExitCare, [place].   ________________________________________________________________________  WHAT IS A BLOOD TRANSFUSION? Blood Transfusion Information  A transfusion is the replacement of blood or some of its parts. Blood is made up of multiple cells which provide different functions.  Red blood cells carry oxygen and are used for blood loss replacement.  White blood cells fight against infection.  Platelets control bleeding.  Plasma helps clot blood.  Other blood products are available for  specialized needs, such as hemophilia or other clotting disorders. BEFORE THE TRANSFUSION  Who gives blood for transfusions?   Healthy volunteers who are fully evaluated to make sure their blood is safe. This is blood bank blood. Transfusion therapy is the safest it has ever been in the practice of medicine. Before blood is taken from a donor, a complete history is taken to make sure that person has no history of diseases nor engages in risky social behavior (examples are intravenous drug use or sexual activity with multiple partners). The donor's travel history is screened to minimize risk of transmitting infections, such as malaria. The donated blood is tested for signs of infectious diseases, such as HIV and hepatitis. The blood is then tested to be sure it is compatible with you in order to minimize the chance of a transfusion reaction. If you or a relative donates blood, this is often done in anticipation of surgery and is not appropriate for emergency situations. It takes many days to process the donated blood. RISKS AND COMPLICATIONS Although transfusion therapy is very safe and saves many lives, the main dangers of transfusion include:   Getting an infectious disease.  Developing a transfusion reaction. This is an allergic reaction to something in the blood  you were given. Every precaution is taken to prevent this. The decision to have a blood transfusion has been considered carefully by your caregiver before blood is given. Blood is not given unless the benefits outweigh the risks. AFTER THE TRANSFUSION  Right after receiving a blood transfusion, you will usually feel much better and more energetic. This is especially true if your red blood cells have gotten low (anemic). The transfusion raises the level of the red blood cells which carry oxygen, and this usually causes an energy increase.  The nurse administering the transfusion will monitor you carefully for complications. HOME CARE  INSTRUCTIONS  No special instructions are needed after a transfusion. You may find your energy is better. Speak with your caregiver about any limitations on activity for underlying diseases you may have. SEEK MEDICAL CARE IF:   Your condition is not improving after your transfusion.  You develop redness or irritation at the intravenous (IV) site. SEEK IMMEDIATE MEDICAL CARE IF:  Any of the following symptoms occur over the next 12 hours:  Shaking chills.  You have a temperature by mouth above 102 F (38.9 C), not controlled by medicine.  Chest, back, or muscle pain.  People around you feel you are not acting correctly or are confused.  Shortness of breath or difficulty breathing.  Dizziness and fainting.  You get a rash or develop hives.  You have a decrease in urine output.  Your urine turns a dark color or changes to pink, red, or brown. Any of the following symptoms occur over the next 10 days:  You have a temperature by mouth above 102 F (38.9 C), not controlled by medicine.  Shortness of breath.  Weakness after normal activity.  The white part of the eye turns yellow (jaundice).  You have a decrease in the amount of urine or are urinating less often.  Your urine turns a dark color or changes to pink, red, or brown. Document Released: 11/09/2000 Document Revised: 02/04/2012 Document Reviewed: 06/28/2008 [facility] Patient Information 2014 [place], [place].  _______________________________________________________________________

## 2017-10-23 NOTE — Progress Notes (Signed)
10-08-17 (Epic) Cardiac clearance from Cecilie Kicks, NP, and                             EKG  10-01-16 (Epic) Stress

## 2017-10-24 ENCOUNTER — Encounter (HOSPITAL_COMMUNITY): Payer: Self-pay

## 2017-10-24 ENCOUNTER — Encounter (HOSPITAL_COMMUNITY)
Admission: RE | Admit: 2017-10-24 | Discharge: 2017-10-24 | Disposition: A | Discharge status: Home / Self Care | Payer: Medicare Other | Source: Ambulatory Visit | Attending: Gynecologic Oncology | Admitting: Gynecologic Oncology

## 2017-10-24 ENCOUNTER — Encounter (INDEPENDENT_AMBULATORY_CARE_PROVIDER_SITE_OTHER): Payer: Self-pay

## 2017-10-24 ENCOUNTER — Other Ambulatory Visit: Payer: Self-pay

## 2017-10-24 DIAGNOSIS — N840 Polyp of corpus uteri: Secondary | ICD-10-CM | POA: Diagnosis not present

## 2017-10-24 DIAGNOSIS — N9489 Other specified conditions associated with female genital organs and menstrual cycle: Secondary | ICD-10-CM | POA: Diagnosis not present

## 2017-10-24 DIAGNOSIS — C541 Malignant neoplasm of endometrium: Secondary | ICD-10-CM | POA: Insufficient documentation

## 2017-10-24 DIAGNOSIS — E785 Hyperlipidemia, unspecified: Secondary | ICD-10-CM | POA: Diagnosis not present

## 2017-10-24 DIAGNOSIS — I10 Essential (primary) hypertension: Secondary | ICD-10-CM | POA: Diagnosis not present

## 2017-10-24 DIAGNOSIS — Z0183 Encounter for blood typing: Secondary | ICD-10-CM | POA: Diagnosis not present

## 2017-10-24 DIAGNOSIS — Z01812 Encounter for preprocedural laboratory examination: Secondary | ICD-10-CM | POA: Diagnosis not present

## 2017-10-24 DIAGNOSIS — D259 Leiomyoma of uterus, unspecified: Secondary | ICD-10-CM | POA: Diagnosis not present

## 2017-10-24 DIAGNOSIS — I252 Old myocardial infarction: Secondary | ICD-10-CM | POA: Diagnosis not present

## 2017-10-24 DIAGNOSIS — Z79899 Other long term (current) drug therapy: Secondary | ICD-10-CM | POA: Diagnosis not present

## 2017-10-24 DIAGNOSIS — Z7982 Long term (current) use of aspirin: Secondary | ICD-10-CM | POA: Diagnosis not present

## 2017-10-24 DIAGNOSIS — M419 Scoliosis, unspecified: Secondary | ICD-10-CM | POA: Diagnosis not present

## 2017-10-24 HISTORY — DX: Other complications of anesthesia, initial encounter: T88.59XA

## 2017-10-24 HISTORY — DX: Adverse effect of unspecified anesthetic, initial encounter: T41.45XA

## 2017-10-24 LAB — URINALYSIS, ROUTINE W REFLEX MICROSCOPIC
Bilirubin Urine: NEGATIVE
Glucose, UA: NEGATIVE mg/dL
Ketones, ur: NEGATIVE mg/dL
Nitrite: NEGATIVE
Protein, ur: NEGATIVE mg/dL
Specific Gravity, Urine: 1.015 (ref 1.005–1.030)
pH: 5 (ref 5.0–8.0)

## 2017-10-24 LAB — COMPREHENSIVE METABOLIC PANEL WITH GFR
ALT: 32 U/L (ref 14–54)
AST: 31 U/L (ref 15–41)
Albumin: 4.1 g/dL (ref 3.5–5.0)
Alkaline Phosphatase: 87 U/L (ref 38–126)
Anion gap: 6 (ref 5–15)
BUN: 20 mg/dL (ref 6–20)
CO2: 27 mmol/L (ref 22–32)
Calcium: 9.6 mg/dL (ref 8.9–10.3)
Chloride: 104 mmol/L (ref 101–111)
Creatinine, Ser: 0.72 mg/dL (ref 0.44–1.00)
GFR calc Af Amer: >60 mL/min (ref >60)
GFR calc non Af Amer: >60 mL/min (ref >60)
Glucose, Bld: 105 mg/dL — ABNORMAL HIGH (ref 65–99)
Potassium: 4.5 mmol/L (ref 3.5–5.1)
Sodium: 137 mmol/L (ref 135–145)
Total Bilirubin: 0.8 mg/dL (ref 0.3–1.2)
Total Protein: 7.2 g/dL (ref 6.5–8.1)

## 2017-10-24 LAB — CBC
HEMATOCRIT: 42.9 % (ref 36.0–46.0)
HEMOGLOBIN: 14.3 g/dL (ref 12.0–15.0)
MCH: 29.5 pg (ref 26.0–34.0)
MCHC: 33.3 g/dL (ref 30.0–36.0)
MCV: 88.6 fL (ref 78.0–100.0)
Platelets: 240 10*3/uL (ref 150–400)
RBC: 4.84 MIL/uL (ref 3.87–5.11)
RDW: 14.2 % (ref 11.5–15.5)
WBC: 7.6 10*3/uL (ref 4.0–10.5)

## 2017-10-24 LAB — ABO/RH: ABO/RH(D): A POS

## 2017-10-24 NOTE — Progress Notes (Signed)
10-24-17 UA result routed to Dr. Denman George for review.

## 2017-10-25 LAB — URINE CULTURE: Culture: <10000 — AB

## 2017-10-29 ENCOUNTER — Encounter (HOSPITAL_COMMUNITY): Payer: Self-pay | Admitting: Gynecologic Oncology

## 2017-10-29 ENCOUNTER — Encounter (HOSPITAL_COMMUNITY)
Admission: RE | Disposition: A | Discharge status: Home / Self Care | Payer: Self-pay | Source: Ambulatory Visit | Attending: Gynecologic Oncology

## 2017-10-29 ENCOUNTER — Ambulatory Visit (HOSPITAL_COMMUNITY): Payer: Medicare Other | Admitting: Registered Nurse

## 2017-10-29 ENCOUNTER — Ambulatory Visit (HOSPITAL_COMMUNITY)
Admission: RE | Admit: 2017-10-29 | Discharge: 2017-10-30 | Disposition: A | Discharge status: Home / Self Care | Payer: Medicare Other | Source: Ambulatory Visit | Attending: Gynecologic Oncology | Admitting: Gynecologic Oncology

## 2017-10-29 ENCOUNTER — Other Ambulatory Visit: Payer: Self-pay

## 2017-10-29 DIAGNOSIS — I10 Essential (primary) hypertension: Secondary | ICD-10-CM | POA: Insufficient documentation

## 2017-10-29 DIAGNOSIS — N840 Polyp of corpus uteri: Secondary | ICD-10-CM | POA: Diagnosis not present

## 2017-10-29 DIAGNOSIS — M419 Scoliosis, unspecified: Secondary | ICD-10-CM | POA: Insufficient documentation

## 2017-10-29 DIAGNOSIS — D259 Leiomyoma of uterus, unspecified: Secondary | ICD-10-CM | POA: Diagnosis not present

## 2017-10-29 DIAGNOSIS — E785 Hyperlipidemia, unspecified: Secondary | ICD-10-CM | POA: Insufficient documentation

## 2017-10-29 DIAGNOSIS — N135 Crossing vessel and stricture of ureter without hydronephrosis: Secondary | ICD-10-CM

## 2017-10-29 DIAGNOSIS — C541 Malignant neoplasm of endometrium: Secondary | ICD-10-CM

## 2017-10-29 DIAGNOSIS — N9489 Other specified conditions associated with female genital organs and menstrual cycle: Secondary | ICD-10-CM | POA: Diagnosis not present

## 2017-10-29 DIAGNOSIS — Z79899 Other long term (current) drug therapy: Secondary | ICD-10-CM | POA: Insufficient documentation

## 2017-10-29 DIAGNOSIS — I252 Old myocardial infarction: Secondary | ICD-10-CM | POA: Insufficient documentation

## 2017-10-29 DIAGNOSIS — Z7982 Long term (current) use of aspirin: Secondary | ICD-10-CM | POA: Insufficient documentation

## 2017-10-29 HISTORY — PX: ROBOTIC ASSISTED TOTAL HYSTERECTOMY WITH BILATERAL SALPINGO OOPHERECTOMY: SHX6086

## 2017-10-29 LAB — TYPE AND SCREEN
ABO/RH(D): A POS
Antibody Screen: NEGATIVE

## 2017-10-29 SURGERY — HYSTERECTOMY, TOTAL, ROBOT-ASSISTED, LAPAROSCOPIC, WITH BILATERAL SALPINGO-OOPHORECTOMY
Anesthesia: General | Laterality: Bilateral

## 2017-10-29 MED ORDER — LIDOCAINE 2% (20 MG/ML) 5 ML SYRINGE
INTRAMUSCULAR | Status: DC | PRN
  Administered 2017-10-29: 80 mg via INTRAVENOUS

## 2017-10-29 MED ORDER — FENTANYL CITRATE (PF) 100 MCG/2ML IJ SOLN
25.0000 ug | INTRAMUSCULAR | Status: DC | PRN
Start: 2017-10-29 — End: 2017-10-29

## 2017-10-29 MED ORDER — KETOROLAC TROMETHAMINE 0.5 % OP SOLN
1.0000 [drp] | Freq: Three times a day (TID) | OPHTHALMIC | Status: DC | PRN
  Administered 2017-10-29: 1 [drp] via OPHTHALMIC
  Filled 2017-10-29: qty 5

## 2017-10-29 MED ORDER — TRAMADOL HCL 50 MG PO TABS: 100.0000 mg | ORAL_TABLET | Freq: Two times a day (BID) | ORAL | Status: DC | PRN

## 2017-10-29 MED ORDER — IBUPROFEN 800 MG PO TABS: 800.0000 mg | ORAL_TABLET | Freq: Three times a day (TID) | ORAL | Status: DC | PRN

## 2017-10-29 MED ORDER — ONDANSETRON HCL 4 MG/2ML IJ SOLN: 4.0000 mg | Freq: Four times a day (QID) | INTRAMUSCULAR | Status: DC | PRN

## 2017-10-29 MED ORDER — SUGAMMADEX SODIUM 200 MG/2ML IV SOLN
INTRAVENOUS | Status: AC
  Filled 2017-10-29: qty 2

## 2017-10-29 MED ORDER — DEXAMETHASONE SODIUM PHOSPHATE 10 MG/ML IJ SOLN
INTRAMUSCULAR | Status: AC
  Filled 2017-10-29: qty 1

## 2017-10-29 MED ORDER — EPHEDRINE SULFATE-NACL 50-0.9 MG/10ML-% IV SOSY
PREFILLED_SYRINGE | INTRAVENOUS | Status: DC | PRN
  Administered 2017-10-29: 10 mg via INTRAVENOUS
  Administered 2017-10-29: 5 mg via INTRAVENOUS

## 2017-10-29 MED ORDER — MIDAZOLAM HCL 5 MG/5ML IJ SOLN
INTRAMUSCULAR | Status: DC | PRN
  Administered 2017-10-29: 2 mg via INTRAVENOUS

## 2017-10-29 MED ORDER — FENTANYL CITRATE (PF) 100 MCG/2ML IJ SOLN
INTRAMUSCULAR | Status: AC
  Filled 2017-10-29: qty 2

## 2017-10-29 MED ORDER — ROCURONIUM BROMIDE 10 MG/ML (PF) SYRINGE
PREFILLED_SYRINGE | INTRAVENOUS | Status: DC | PRN
  Administered 2017-10-29: 50 mg via INTRAVENOUS
  Administered 2017-10-29: 1 mg via INTRAVENOUS

## 2017-10-29 MED ORDER — PROPOFOL 10 MG/ML IV BOLUS
INTRAVENOUS | Status: AC
  Filled 2017-10-29: qty 20

## 2017-10-29 MED ORDER — PHENYLEPHRINE 40 MCG/ML (10ML) SYRINGE FOR IV PUSH (FOR BLOOD PRESSURE SUPPORT)
PREFILLED_SYRINGE | INTRAVENOUS | Status: AC
  Filled 2017-10-29: qty 10

## 2017-10-29 MED ORDER — ACETAMINOPHEN 500 MG PO TABS
1000.0000 mg | ORAL_TABLET | ORAL | Status: AC
  Administered 2017-10-29: 1000 mg via ORAL
  Filled 2017-10-29: qty 2

## 2017-10-29 MED ORDER — HYDROMORPHONE HCL 1 MG/ML IJ SOLN: 0.2000 mg | INTRAMUSCULAR | Status: DC | PRN

## 2017-10-29 MED ORDER — FENTANYL CITRATE (PF) 100 MCG/2ML IJ SOLN
INTRAMUSCULAR | Status: DC | PRN
  Administered 2017-10-29 (×4): 50 ug via INTRAVENOUS

## 2017-10-29 MED ORDER — ONDANSETRON HCL 4 MG/2ML IJ SOLN
INTRAMUSCULAR | Status: AC
  Filled 2017-10-29: qty 2

## 2017-10-29 MED ORDER — ONDANSETRON HCL 4 MG/2ML IJ SOLN
INTRAMUSCULAR | Status: DC | PRN
  Administered 2017-10-29: 4 mg via INTRAVENOUS

## 2017-10-29 MED ORDER — BSS IO SOLN
15.0000 mL | Freq: Once | INTRAOCULAR | Status: AC
  Administered 2017-10-29: 15 mL
  Filled 2017-10-29: qty 15

## 2017-10-29 MED ORDER — OXYCODONE-ACETAMINOPHEN 5-325 MG PO TABS
ORAL_TABLET | ORAL | Status: AC
  Administered 2017-10-29: 1
  Filled 2017-10-29: qty 1

## 2017-10-29 MED ORDER — METOPROLOL SUCCINATE ER 25 MG PO TB24: 25.0000 mg | ORAL_TABLET | Freq: Every day | ORAL | Status: DC

## 2017-10-29 MED ORDER — SENNOSIDES-DOCUSATE SODIUM 8.6-50 MG PO TABS: 2.0000 | ORAL_TABLET | Freq: Every day | ORAL | Status: DC

## 2017-10-29 MED ORDER — PHENYLEPHRINE 40 MCG/ML (10ML) SYRINGE FOR IV PUSH (FOR BLOOD PRESSURE SUPPORT)
PREFILLED_SYRINGE | INTRAVENOUS | Status: DC | PRN
  Administered 2017-10-29: 80 ug via INTRAVENOUS

## 2017-10-29 MED ORDER — STERILE WATER FOR INJECTION IJ SOLN
INTRAMUSCULAR | Status: AC
  Filled 2017-10-29: qty 10

## 2017-10-29 MED ORDER — LACTATED RINGERS IR SOLN
Status: DC | PRN
  Administered 2017-10-29: 1000 mL

## 2017-10-29 MED ORDER — LACTATED RINGERS IV SOLN
INTRAVENOUS | Status: DC
  Administered 2017-10-29: 08:00:00 via INTRAVENOUS

## 2017-10-29 MED ORDER — STERILE WATER FOR IRRIGATION IR SOLN
Status: DC | PRN
  Administered 2017-10-29: 1000 mL

## 2017-10-29 MED ORDER — OXYCODONE-ACETAMINOPHEN 5-325 MG PO TABS: 1.0000 | ORAL_TABLET | ORAL | Status: DC | PRN

## 2017-10-29 MED ORDER — HYDRALAZINE HCL 20 MG/ML IJ SOLN
INTRAMUSCULAR | Status: AC
  Filled 2017-10-29: qty 1

## 2017-10-29 MED ORDER — CEFAZOLIN SODIUM-DEXTROSE 2-4 GM/100ML-% IV SOLN
2.0000 g | INTRAVENOUS | Status: AC
  Administered 2017-10-29: 2 g via INTRAVENOUS
  Filled 2017-10-29: qty 100

## 2017-10-29 MED ORDER — ENOXAPARIN SODIUM 40 MG/0.4ML ~~LOC~~ SOLN
40.0000 mg | SUBCUTANEOUS | Status: DC
  Administered 2017-10-30: 40 mg via SUBCUTANEOUS
  Filled 2017-10-29: qty 0.4

## 2017-10-29 MED ORDER — KCL IN DEXTROSE-NACL 20-5-0.45 MEQ/L-%-% IV SOLN
INTRAVENOUS | Status: DC
  Administered 2017-10-29: 16:00:00 via INTRAVENOUS
  Filled 2017-10-29 (×2): qty 1000

## 2017-10-29 MED ORDER — DEXAMETHASONE SODIUM PHOSPHATE 10 MG/ML IJ SOLN
INTRAMUSCULAR | Status: DC | PRN
  Administered 2017-10-29: 10 mg via INTRAVENOUS

## 2017-10-29 MED ORDER — EPHEDRINE 5 MG/ML INJ
INTRAVENOUS | Status: AC
  Filled 2017-10-29: qty 10

## 2017-10-29 MED ORDER — DEXAMETHASONE SODIUM PHOSPHATE 4 MG/ML IJ SOLN: 4.0000 mg | INTRAMUSCULAR | Status: DC

## 2017-10-29 MED ORDER — ONDANSETRON HCL 4 MG/2ML IJ SOLN: 4.0000 mg | Freq: Once | INTRAMUSCULAR | Status: DC | PRN

## 2017-10-29 MED ORDER — ENOXAPARIN SODIUM 40 MG/0.4ML ~~LOC~~ SOLN
40.0000 mg | SUBCUTANEOUS | Status: AC
  Administered 2017-10-29: 40 mg via SUBCUTANEOUS
  Filled 2017-10-29: qty 0.4

## 2017-10-29 MED ORDER — GABAPENTIN 300 MG PO CAPS
300.0000 mg | ORAL_CAPSULE | ORAL | Status: AC
  Administered 2017-10-29: 300 mg via ORAL
  Filled 2017-10-29: qty 1

## 2017-10-29 MED ORDER — PROPOFOL 10 MG/ML IV BOLUS
INTRAVENOUS | Status: DC | PRN
  Administered 2017-10-29: 150 mg via INTRAVENOUS

## 2017-10-29 MED ORDER — IRBESARTAN 300 MG PO TABS
300.0000 mg | ORAL_TABLET | Freq: Every day | ORAL | Status: DC
  Filled 2017-10-29: qty 1

## 2017-10-29 MED ORDER — FENTANYL CITRATE (PF) 100 MCG/2ML IJ SOLN
INTRAMUSCULAR | Status: AC
Start: 2017-10-29 — End: ?
  Filled 2017-10-29: qty 2

## 2017-10-29 MED ORDER — ROCURONIUM BROMIDE 50 MG/5ML IV SOSY
PREFILLED_SYRINGE | INTRAVENOUS | Status: AC
  Filled 2017-10-29: qty 5

## 2017-10-29 MED ORDER — ONDANSETRON HCL 4 MG PO TABS: 4.0000 mg | ORAL_TABLET | Freq: Four times a day (QID) | ORAL | Status: DC | PRN

## 2017-10-29 MED ORDER — HYDRALAZINE HCL 20 MG/ML IJ SOLN: 2.0000 mg | INTRAMUSCULAR | Status: DC

## 2017-10-29 MED ORDER — CELECOXIB 200 MG PO CAPS
400.0000 mg | ORAL_CAPSULE | ORAL | Status: AC
  Administered 2017-10-29: 400 mg via ORAL
  Filled 2017-10-29: qty 2

## 2017-10-29 MED ORDER — HYDRALAZINE HCL 20 MG/ML IJ SOLN
5.0000 mg | INTRAMUSCULAR | Status: AC
  Administered 2017-10-29: 5 mg via INTRAVENOUS

## 2017-10-29 MED ORDER — SCOPOLAMINE 1 MG/3DAYS TD PT72
1.0000 | MEDICATED_PATCH | TRANSDERMAL | Status: DC
  Administered 2017-10-29: 1.5 mg via TRANSDERMAL
  Filled 2017-10-29: qty 1

## 2017-10-29 MED ORDER — MIDAZOLAM HCL 2 MG/2ML IJ SOLN
INTRAMUSCULAR | Status: AC
  Filled 2017-10-29: qty 2

## 2017-10-29 MED ORDER — SUGAMMADEX SODIUM 200 MG/2ML IV SOLN
INTRAVENOUS | Status: DC | PRN
  Administered 2017-10-29: 160 mg via INTRAVENOUS

## 2017-10-29 SURGICAL SUPPLY — 49 items
APPLICATOR SURGIFLO ENDO (HEMOSTASIS) IMPLANT
BAG LAPAROSCOPIC 12 15 PORT 16 (BASKET) IMPLANT
BAG RETRIEVAL 12/15 (BASKET)
BAG RETRIEVAL 12/15MM (BASKET)
COVER BACK TABLE 60X90IN (DRAPES) ×3 IMPLANT
COVER TIP SHEARS 8 DVNC (MISCELLANEOUS) ×1 IMPLANT
COVER TIP SHEARS 8MM DA VINCI (MISCELLANEOUS) ×2
DERMABOND ADVANCED (GAUZE/BANDAGES/DRESSINGS) ×2
DERMABOND ADVANCED .7 DNX12 (GAUZE/BANDAGES/DRESSINGS) ×1 IMPLANT
DRAPE ARM DVNC X/XI (DISPOSABLE) ×4 IMPLANT
DRAPE COLUMN DVNC XI (DISPOSABLE) ×1 IMPLANT
DRAPE DA VINCI XI ARM (DISPOSABLE) ×8
DRAPE DA VINCI XI COLUMN (DISPOSABLE) ×2
DRAPE SHEET LG 3/4 BI-LAMINATE (DRAPES) ×3 IMPLANT
DRAPE SURG IRRIG POUCH 19X23 (DRAPES) ×3 IMPLANT
ELECT REM PT RETURN 15FT ADLT (MISCELLANEOUS) ×3 IMPLANT
GLOVE BIO SURGEON STRL SZ 6 (GLOVE) ×12 IMPLANT
GLOVE BIO SURGEON STRL SZ 6.5 (GLOVE) ×4 IMPLANT
GLOVE BIO SURGEONS STRL SZ 6.5 (GLOVE) ×2
GOWN STRL REUS W/ TWL LRG LVL3 (GOWN DISPOSABLE) ×2 IMPLANT
GOWN STRL REUS W/TWL LRG LVL3 (GOWN DISPOSABLE) ×4
HOLDER FOLEY CATH W/STRAP (MISCELLANEOUS) ×3 IMPLANT
IRRIG SUCT STRYKERFLOW 2 WTIP (MISCELLANEOUS) ×3
IRRIGATION SUCT STRKRFLW 2 WTP (MISCELLANEOUS) ×1 IMPLANT
KIT PROCEDURE DA VINCI SI (MISCELLANEOUS)
KIT PROCEDURE DVNC SI (MISCELLANEOUS) IMPLANT
MANIPULATOR UTERINE 4.5 ZUMI (MISCELLANEOUS) ×3 IMPLANT
NDL SAFETY ECLIPSE 18X1.5 (NEEDLE) ×1 IMPLANT
NEEDLE HYPO 18GX1.5 SHARP (NEEDLE) ×2
NEEDLE SPNL 18GX3.5 QUINCKE PK (NEEDLE) ×3 IMPLANT
OBTURATOR OPTICAL STANDARD 8MM (TROCAR) ×2
OBTURATOR OPTICAL STND 8 DVNC (TROCAR) ×1
OBTURATOR OPTICALSTD 8 DVNC (TROCAR) ×1 IMPLANT
PACK ROBOT GYN CUSTOM WL (TRAY / TRAY PROCEDURE) ×3 IMPLANT
PAD POSITIONING PINK XL (MISCELLANEOUS) ×3 IMPLANT
POUCH SPECIMEN RETRIEVAL 10MM (ENDOMECHANICALS) ×3 IMPLANT
SEAL CANN UNIV 5-8 DVNC XI (MISCELLANEOUS) ×4 IMPLANT
SEAL XI 5MM-8MM UNIVERSAL (MISCELLANEOUS) ×8
SET TRI-LUMEN FLTR TB AIRSEAL (TUBING) ×3 IMPLANT
SOLUTION ELECTROLUBE (MISCELLANEOUS) ×3 IMPLANT
SURGIFLO W/THROMBIN 8M KIT (HEMOSTASIS) IMPLANT
SUT VIC AB 0 CT1 27 (SUTURE) ×2
SUT VIC AB 0 CT1 27XBRD ANTBC (SUTURE) ×1 IMPLANT
SYR 10ML LL (SYRINGE) ×3 IMPLANT
TOWEL OR NON WOVEN STRL DISP B (DISPOSABLE) ×3 IMPLANT
TRAP SPECIMEN MUCOUS 40CC (MISCELLANEOUS) IMPLANT
TRAY FOLEY W/METER SILVER 16FR (SET/KITS/TRAYS/PACK) ×3 IMPLANT
UNDERPAD 30X30 (UNDERPADS AND DIAPERS) ×3 IMPLANT
WATER STERILE IRR 1000ML POUR (IV SOLUTION) IMPLANT

## 2017-10-29 NOTE — Discharge Instructions (Signed)
10/29/2017  Return to work: 4 weeks  Activity: 1. Be up and out of the bed during the day.  Take a nap if needed.  You may walk up steps but be careful and use the hand rail.  Stair climbing will tire you more than you think, you may need to stop part way and rest.   2. No lifting or straining for 6 weeks.  3. No driving for 1 weeks.  Do Not drive if you are taking narcotic pain medicine.  4. Shower daily.  Use soap and water on your incision and pat dry; don't rub.   5. No sexual activity and nothing in the vagina for 8 weeks.  Medications:  - Take ibuprofen and tylenol first line for pain control. Take these regularly (every 6 hours) to decrease the build up of pain.  - If necessary, for severe pain not relieved by ibuprofen, take percocet.  - While taking percocet you should take sennakot every night to reduce the likelihood of constipation. If this causes diarrhea, stop its use.  Diet: 1. Low sodium Heart Healthy Diet is recommended.  2. It is safe to use a laxative if you have difficulty moving your bowels.   Wound Care: 1. Keep clean and dry.  Shower daily.  Reasons to call the Doctor:   Fever - Oral temperature greater than 100.4 degrees Fahrenheit  Foul-smelling vaginal discharge  Difficulty urinating  Nausea and vomiting  Increased pain at the site of the incision that is unrelieved with pain medicine.  Difficulty breathing with or without chest pain  New calf pain especially if only on one side  Sudden, continuing increased vaginal bleeding with or without clots.   Follow-up: 1. See Everitt Amber in 2-3 weeks.  Contacts: For questions or concerns you should contact:  Dr. Everitt Amber at [phone] After hours and on week-ends call [phone] and ask to speak to the physician on call for Gynecologic Oncology

## 2017-10-29 NOTE — Anesthesia Procedure Notes (Signed)
Procedure Name: Intubation Date/Time: 10/29/2017 10:16 AM Performed by: Victoriano Lain, CRNA Pre-anesthesia Checklist: Patient identified, Emergency Drugs available, Suction available, Patient being monitored and Timeout performed Patient Re-evaluated:Patient Re-evaluated prior to induction Oxygen Delivery Method: Circle system utilized Preoxygenation: Pre-oxygenation with 100% oxygen Induction Type: IV induction Ventilation: Mask ventilation without difficulty Laryngoscope Size: Mac and 3 Grade View: Grade I Tube type: Oral Tube size: 7.5 mm Number of attempts: 1 Airway Equipment and Method: Stylet Placement Confirmation: ETT inserted through vocal cords under direct vision and breath sounds checked- equal and bilateral Secured at: 21 cm Tube secured with: Tape Dental Injury: Teeth and Oropharynx as per pre-operative assessment

## 2017-10-29 NOTE — Interval H&P Note (Signed)
History and Physical Interval Note:  10/29/2017 9:48 AM  Denise Fox  has presented today for surgery, with the diagnosis of endometrial cancer  The various methods of treatment have been discussed with the patient and family. After consideration of risks, benefits and other options for treatment, the patient has consented to  Procedure(s): XI ROBOTIC ASSISTED TOTAL HYSTERECTOMY WITH BILATERAL SALPINGO OOPHORECTOMY WITH SENTINAL LYMPH NODES (Bilateral) as a surgical intervention .  The patient's history has been reviewed, patient examined, no change in status, stable for surgery.  Biopsy showed grade 1 endometrial cancer. I have reviewed the patient's chart and labs.  Questions were answered to the patient's satisfaction.     Donaciano Eva

## 2017-10-29 NOTE — Op Note (Signed)
OPERATIVE NOTE 10/29/17  Surgeon: Donaciano Eva   Assistants: Dr Lahoma Crocker (an MD assistant was necessary for tissue manipulation, management of robotic instrumentation, retraction and positioning due to the complexity of the case and hospital policies).   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  Operation: Robotic-assisted laparoscopic total hysterectomy with bilateral salpingoophorectomy, SLN biopsy, left pelvic lymphadenectomy, left ureterolysis   Surgeon: Donaciano Eva  Assistant Surgeon: Lahoma Crocker MD  Anesthesia: GET  Urine Output: 300cc  Operative Findings:  : 10cm uterus, irregular shape with apparent fibroids, nodularity covering peritoneal surface of uterus, tubes, ovaries and sigmoid colon. Ovaries enlarged and adherent to sigmoid colon, posterior cul de sac and left ovarian fossa. Left retroperitoneal fibrosis. Normal appearing omentum and upper abdomen.  Estimated Blood Loss:  less than 50 mL      Total IV Fluids: 700 ml         Specimens: uterus, cervix, bilateral tubes and ovaries, left pelvic lymph nodes, right obturator SLN, right external iliac SLN, right presacral SLN, cul de sac nodule         Complications:  None; patient tolerated the procedure well.         Disposition: PACU - hemodynamically stable.  Procedure Details  The patient was seen in the Holding Room. The risks, benefits, complications, treatment options, and expected outcomes were discussed with the patient.  The patient concurred with the proposed plan, giving informed consent.  The site of surgery properly noted/marked. The patient was identified as Denise Fox and the procedure verified as a Robotic-assisted hysterectomy with bilateral salpingo oophorectomy with SLN biopsy. A Time Out was  held and the above information confirmed.  After induction of anesthesia, the patient was draped and prepped in the usual sterile manner. Pt was placed in supine position after anesthesia and draped and prepped in the usual sterile manner. The abdominal drape was placed after the CholoraPrep had been allowed to dry for 3 minutes.  Her arms were tucked to her side with all appropriate precautions.  The shoulders were stabilized with padded shoulder blocks applied to the acromium processes.  The patient was placed in the semi-lithotomy position in [place].  The perineum was prepped with Betadine. The patient was then prepped. Foley catheter was placed.  A sterile speculum was placed in the vagina.  The cervix was grasped with a single-tooth tenaculum. 2mg  total of ICG was injected into the cervical stroma at 2 and 9 o'clock with 1cc injected at a 1cm and 9mm depth (concentration 0.5mg /ml) in all locations. The cervix was dilated with Kennon Rounds dilators.  The ZUMI uterine manipulator with a medium colpotomizer ring was placed without difficulty.  A pneum occluder balloon was placed over the manipulator.  OG tube placement was confirmed and to suction.   Next, a 5 mm skin incision was made 1 cm below the subcostal margin in the midclavicular line.  The 5 mm Optiview port and scope was used for direct entry.  Opening pressure was under 10 mm CO2.  The abdomen was insufflated and the findings were noted as above.   At this point and all points during the procedure, the patient's intra-abdominal pressure did not exceed 15 mmHg. Next, a 10 mm skin incision was made in the umbilicus and a right and left port was placed about 10 cm lateral to the robot port  on the right and left side.  A fourth arm was placed in the left lower quadrant 2 cm above and superior and medial to the anterior superior iliac spine.  All ports were placed under direct visualization.  The patient was placed in steep Trendelenburg.  Bowel was  folded away into the upper abdomen.  The robot was docked in the normal manner.  The right and left peritoneum were opened parallel to the IP ligament to open the retroperitoneal spaces bilaterally. The SLN mapping was performed in bilateral pelvic basins. The para rectal and paravesical spaces were opened up entirely with careful dissection below the level of the ureters bilaterally and to the depth of the uterine artery origin in order to skeletonize the uterine "web" and ensure visualization of all parametrial channels. The para-aortic basins were carefully exposed and evaluated for isolated para-aortic SLN's. Lymphatic channels were identified travelling to the following visualized sentinel lymph node's: right external iliac, right obturator SLN, right pre-sacral SLN. These SLN's were separated from their surrounding lymphatic tissue, removed and sent for permanent pathology.  Due to unilateral mapping to the right, a left pelvic lymph node dissection was then performed in accordance with the SLN algorithm. The paravesical space was developed with monopolar and sharp dissection. It was held open with tension on the median umbilical ligament with the forth arm. The paravesical space was opened with blunt and sharp dissection to mobilize the ureter off of the medial surface of the internal iliac artery. The medial leaf of the broad ligament containing the ureter was held medially (opening the pararectal space) by the assistant's grasper. The left pelvic lymphadenectomy was performed by skeletonizing the internal iliac artery at the bifurcation with the external iliac artery. The obturator nerve was identified in the base of lateral paravesical space. The ureter was mobilized medially off of the dissection by developing the pararectal space. The genitofemoral nerve was identified, skeletonized and mobilized laterally off of the external iliac artery. An enbloc resection of lymph nodes was performed within the  following boundaries: the mid portion of the common iliac proximally, the circumflex iliac vein distally, the obturator nerve posteriorally, the genitofemoral nerve laterally. The nodal basin (including obturator space) were confirmed to be empty of nodes and hemostatic. The nodes were placed in an endocatch bag and retrieved vaginally.  The hysterectomy was started after the round ligament on the right side was incised and the retroperitoneum was entered and the pararectal space was developed.  The ureter was noted to be on the medial leaf of the broad ligament.  The peritoneum above the ureter was incised and stretched and the infundibulopelvic ligament was skeletonized, cauterized and cut.  The posterior peritoneum was taken down to the level of the KOH ring.  The anterior peritoneum was also taken down.  The bladder flap was created to the level of the KOH ring.  The uterine artery on the right side was skeletonized, cauterized and cut in the normal manner.  A similar procedure was performed on the left. There were dense adhesions between the ovary and posterior cul de sac nodules and the uterus. These were dissected with the robot to free the ovary from the fossa. The left retroperitoneum was dense with retroperitoneal fibrosis and therefore left ureterolysis was performed. The ureter was skeletonized 360 degrees, the ureter was carefully dissected from under the uterine artery. The uterine artery on the left was taken at its origin to control bleeding at the isthmus in proximity to the ureter.  The ureter was dissected free from the lateral cervix and this allowed mobilization of the uterine isthmus from the retroperitoneum. The uterine artery was then sacrificed at the isthmus and the cardinal ligmanent skeletonized off of the cervix.  The colpotomy was made and the uterus, cervix, bilateral ovaries and tubes were amputated and delivered through the vagina. The nodule in the cul de sac was resected and sent  for pathology. Pedicles were inspected and excellent hemostasis was achieved.    The colpotomy at the vaginal cuff was closed with Vicryl on a CT1 needle in a running manner.  Irrigation was used and excellent hemostasis was achieved.  At this point in the procedure was completed.  Robotic instruments were removed under direct visulaization.  The robot was undocked. The 10 mm ports were closed with Vicryl on a UR-5 needle and the fascia was closed with 0 Vicryl on a UR-5 needle.  The skin was closed with 4-0 Vicryl in a subcuticular manner.  Dermabond was applied.  Sponge, lap and needle counts correct x 2.  The patient was taken to the recovery room in stable condition. At the completion of the case there remained miliary nodularity overlying the peritoneal surfaces.  The vagina was swabbed with  minimal bleeding noted.   All instrument and needle counts were correct x  3.   The patient was transferred to the recovery room in a stable condition.  Donaciano Eva, MD

## 2017-10-29 NOTE — Anesthesia Preprocedure Evaluation (Addendum)
Anesthesia Evaluation  Patient identified by MRN, date of birth, ID band Patient awake    Reviewed: Allergy & Precautions, NPO status , Patient's Chart, lab work & pertinent test results, reviewed documented beta blocker date and time   History of Anesthesia Complications (+) history of anesthetic complications ("stops breathing in her sleep after anesthesia")  Airway Mallampati: II  TM Distance: <3 FB Neck ROM: Full    Dental  (+) Teeth Intact, Dental Advisory Given   Pulmonary sleep apnea ,    Pulmonary exam normal breath sounds clear to auscultation       Cardiovascular hypertension, Pt. on home beta blockers and Pt. on medications (-) angina+ CAD and + Past MI  Normal cardiovascular exam+ dysrhythmias (RBBB) + Valvular Problems/Murmurs MVP  Rhythm:Regular Rate:Normal     Neuro/Psych negative neurological ROS  negative psych ROS   GI/Hepatic negative GI ROS, Neg liver ROS,   Endo/Other  Obesity   Renal/GU negative Renal ROS     Musculoskeletal  (+) Arthritis , Osteoarthritis,    Abdominal   Peds  Hematology negative hematology ROS (+)   Anesthesia Other Findings Day of surgery medications reviewed with the patient.  Reproductive/Obstetrics Endometrial cancer                            Anesthesia Physical Anesthesia Plan  ASA: III  Anesthesia Plan: General   Post-op Pain Management:    Induction: Intravenous  PONV Risk Score and Plan: 3 and Ondansetron, Midazolam and Dexamethasone  Airway Management Planned: Oral ETT  Additional Equipment:   Intra-op Plan:   Post-operative Plan: Extubation in OR  Informed Consent: I have reviewed the patients History and Physical, chart, labs and discussed the procedure including the risks, benefits and alternatives for the proposed anesthesia with the patient or authorized representative who has indicated his/her understanding and  acceptance.   Dental advisory given  Plan Discussed with: CRNA and Anesthesiologist  Anesthesia Plan Comments: (Risks/benefits of general anesthesia discussed with patient including risk of damage to teeth, lips, gum, and tongue, nausea/vomiting, allergic reactions to medications, and the possibility of heart attack, stroke and death.  All patient questions answered.  Patient wishes to proceed.)       Anesthesia Quick Evaluation

## 2017-10-29 NOTE — Transfer of Care (Signed)
Immediate Anesthesia Transfer of Care Note  Patient: Denise Fox  Procedure(s) Performed: XI ROBOTIC ASSISTED TOTAL HYSTERECTOMY WITH BILATERAL SALPINGO OOPHORECTOMY WITH SENTINAL LYMPH NODES, pelvic lymphadenectomy, and left ureterolysis (Bilateral )  Patient Location: PACU  Anesthesia Type:General  Level of Consciousness: awake, alert , oriented and patient cooperative  Airway & Oxygen Therapy: Patient Spontanous Breathing and Patient connected to face mask oxygen  Post-op Assessment: Report given to RN, Post -op Vital signs reviewed and stable and Patient moving all extremities  Post vital signs: Reviewed and stable  Last Vitals:  Vitals:   10/29/17 0730  BP: (!) 148/74  Pulse: 65  Resp: 16  Temp: 36.4 C  SpO2: 99%    Last Pain:  Vitals:   10/29/17 0730  TempSrc: Oral      Patients Stated Pain Goal: 4 (81/01/75 1025)  Complications: No apparent anesthesia complications

## 2017-10-30 ENCOUNTER — Encounter (HOSPITAL_COMMUNITY): Payer: Self-pay | Admitting: Gynecologic Oncology

## 2017-10-30 DIAGNOSIS — C541 Malignant neoplasm of endometrium: Secondary | ICD-10-CM | POA: Diagnosis not present

## 2017-10-30 LAB — CBC
HCT: 39.9 % (ref 36.0–46.0)
HEMOGLOBIN: 13.1 g/dL (ref 12.0–15.0)
MCH: 29 pg (ref 26.0–34.0)
MCHC: 32.8 g/dL (ref 30.0–36.0)
MCV: 88.3 fL (ref 78.0–100.0)
PLATELETS: 258 10*3/uL (ref 150–400)
RBC: 4.52 MIL/uL (ref 3.87–5.11)
RDW: 14.1 % (ref 11.5–15.5)
WBC: 12.1 10*3/uL — ABNORMAL HIGH (ref 4.0–10.5)

## 2017-10-30 LAB — BASIC METABOLIC PANEL
Anion gap: 7 (ref 5–15)
BUN: 22 mg/dL — AB (ref 6–20)
CHLORIDE: 106 mmol/L (ref 101–111)
CO2: 24 mmol/L (ref 22–32)
CREATININE: 0.77 mg/dL (ref 0.44–1.00)
Calcium: 9.7 mg/dL (ref 8.9–10.3)
Glucose, Bld: 183 mg/dL — ABNORMAL HIGH (ref 65–99)
POTASSIUM: 4.5 mmol/L (ref 3.5–5.1)
SODIUM: 137 mmol/L (ref 135–145)

## 2017-10-30 MED ORDER — SENNOSIDES-DOCUSATE SODIUM 8.6-50 MG PO TABS: 2.0000 | ORAL_TABLET | Freq: Every day | ORAL | 1 refills | Status: DC

## 2017-10-30 MED ORDER — OXYCODONE-ACETAMINOPHEN 5-325 MG PO TABS: 1.0000 | ORAL_TABLET | ORAL | 0 refills | Status: DC | PRN

## 2017-10-30 MED ORDER — IBUPROFEN 800 MG PO TABS: 800.0000 mg | ORAL_TABLET | Freq: Three times a day (TID) | ORAL | 0 refills | Status: DC | PRN

## 2017-10-30 NOTE — Discharge Summary (Signed)
Physician Discharge Summary  Patient ID: Denise Fox MRN: [id number] DOB/AGE: 05/31/1946 71 y.o.  Admit date: 10/29/2017 Discharge date: 10/30/2017  Admission Diagnoses: Endometrial cancer [facility])  Discharge Diagnoses:  Principal Problem:   Endometrial cancer [facility])   Discharged Condition: good  Hospital Course:  1/ patient was admitted on 10/30/17 for a robotic hysterectomy, BSO, SLN mapping and lymphadenectomy for endometrial cancer 2/ surgery was uncomplicated  3/ on postoperative day 1 the patient was meeting discharge criteria: tolerating PO, voiding urine, ambulating, pain well controlled on oral medications.  4/ new medications on discharge include percocet and sennakot.  She acquired a right corneal abrasion which was symptomatically improved with topical therapy  Consults: None  Significant Diagnostic Studies: labs:  CBC    Component Value Date/Time   WBC 12.1 (H) 10/30/2017 0452   RBC 4.52 10/30/2017 0452   HGB 13.1 10/30/2017 0452   HCT 39.9 10/30/2017 0452   PLT 258 10/30/2017 0452   MCV 88.3 10/30/2017 0452   MCH 29.0 10/30/2017 0452   MCHC 32.8 10/30/2017 0452   RDW 14.1 10/30/2017 0452     Treatments: surgery: see above  Discharge Exam: Blood pressure 124/71, pulse (!) 59, temperature 98.4 F (36.9 C), temperature source Oral, resp. rate 18, height 5\' 3"  (1.6 m), weight 176 lb (79.8 kg), SpO2 95 %. General appearance: alert and cooperative Eyes: conjunctivae/corneas clear. PERRL, EOM's intact. Fundi benign. GI: soft, non-tender; bowel sounds normal; no masses,  no organomegaly Incision/Wound: clean and in tact x 5  Disposition: 01-Home or Self Care   Allergies as of 10/30/2017      Reactions   Bee Venom Anaphylaxis   Epinephrine Other (See Comments), Hypertension   Elevated BP/shaking/sickness.   Atorvastatin Other (See Comments)   Unknown   Other Other (See Comments)   Perfume - headache   Hct [hydrochlorothiazide] Anxiety   Lisinopril Anxiety       Medication List    TAKE these medications   diclofenac sodium 1 % Gel Commonly known as:  VOLTAREN Apply 2 g 4 (four) times daily as needed topically (for knee/hand pain).   ibuprofen 800 MG tablet Commonly known as:  ADVIL,MOTRIN Take 1 tablet (800 mg total) by mouth every 8 (eight) hours as needed (mild pain).   metoprolol succinate 25 MG 24 hr tablet Commonly known as:  TOPROL-XL Take 1 tablet (25 mg total) by mouth daily.   olmesartan 40 MG tablet Commonly known as:  BENICAR Take 1 tablet (40 mg total) by mouth daily.   oxyCODONE-acetaminophen 5-325 MG tablet Commonly known as:  PERCOCET/ROXICET Take 1-2 tablets by mouth every 4 (four) hours as needed (moderate to severe pain (when tolerating fluids)).   senna-docusate 8.6-50 MG tablet Commonly known as:  Senokot-S Take 2 tablets by mouth at bedtime.        Signed: Donaciano Eva 10/30/2017, 8:54 AM

## 2017-10-30 NOTE — Progress Notes (Signed)
Pt was discharged home today. Instructions were reviewed with patient, and questions were answered. Pt was taken to main entrance via wheelchair by NT.  

## 2017-10-30 NOTE — Anesthesia Postprocedure Evaluation (Signed)
Anesthesia Post Note  Patient: Denise Fox  Procedure(s) Performed: XI ROBOTIC ASSISTED TOTAL HYSTERECTOMY WITH BILATERAL SALPINGO OOPHORECTOMY WITH SENTINAL LYMPH NODES, pelvic lymphadenectomy, and left ureterolysis (Bilateral )     Patient location during evaluation: PACU Anesthesia Type: General Level of consciousness: awake and alert Pain management: pain level controlled Vital Signs Assessment: post-procedure vital signs reviewed and stable Respiratory status: spontaneous breathing, nonlabored ventilation and respiratory function stable Cardiovascular status: blood pressure returned to baseline and stable Postop Assessment: no apparent nausea or vomiting Anesthetic complications: no    Last Vitals:  Vitals:   10/30/17 0230 10/30/17 0622  BP: 111/64 124/71  Pulse: (!) 59 (!) 59  Resp: 18 18  Temp: 36.5 C 36.9 C  SpO2: 93% 95%    Last Pain:  Vitals:   10/30/17 0622  TempSrc: Oral  PainSc:                  Catalina Gravel

## 2017-11-01 ENCOUNTER — Telehealth: Payer: Self-pay | Admitting: Gynecologic Oncology

## 2017-11-01 NOTE — Telephone Encounter (Signed)
Post op telephone call to check patient status.  Patient describes expected post operative status.  Adequate PO intake reported.  Bowels and bladder functioning without difficulty.  Pain minimal.  Reportable signs and symptoms reviewed.  Follow up appt given.  Post op telephone call to check patient status.  Patient describes expected post operative status.  Adequate PO intake reported.  Bowels and bladder functioning without difficulty.  Pain minimal.  Reportable signs and symptoms reviewed.  Final path discussed along with Dr. Serita Grit recommendations for no adjuvant therapy.  Advised to call for any needs.

## 2017-11-03 ENCOUNTER — Ambulatory Visit: Payer: Self-pay | Admitting: Orthopedic Surgery

## 2017-11-20 ENCOUNTER — Ambulatory Visit: Payer: Medicare Other

## 2017-11-20 ENCOUNTER — Other Ambulatory Visit: Payer: Self-pay | Admitting: Lab

## 2017-11-20 ENCOUNTER — Encounter: Payer: Self-pay | Admitting: Gynecologic Oncology

## 2017-11-20 ENCOUNTER — Ambulatory Visit: Payer: Medicare Other | Attending: Gynecologic Oncology | Admitting: Gynecologic Oncology

## 2017-11-20 VITALS — BP 139/80 | HR 64 | Temp 98.0°F | Resp 18 | Wt 175.3 lb

## 2017-11-20 DIAGNOSIS — I10 Essential (primary) hypertension: Secondary | ICD-10-CM | POA: Insufficient documentation

## 2017-11-20 DIAGNOSIS — R300 Dysuria: Secondary | ICD-10-CM | POA: Diagnosis not present

## 2017-11-20 DIAGNOSIS — Z8249 Family history of ischemic heart disease and other diseases of the circulatory system: Secondary | ICD-10-CM | POA: Diagnosis not present

## 2017-11-20 DIAGNOSIS — Z90710 Acquired absence of both cervix and uterus: Secondary | ICD-10-CM | POA: Insufficient documentation

## 2017-11-20 DIAGNOSIS — Z7189 Other specified counseling: Secondary | ICD-10-CM

## 2017-11-20 DIAGNOSIS — Z79899 Other long term (current) drug therapy: Secondary | ICD-10-CM | POA: Diagnosis not present

## 2017-11-20 DIAGNOSIS — Z809 Family history of malignant neoplasm, unspecified: Secondary | ICD-10-CM | POA: Diagnosis not present

## 2017-11-20 DIAGNOSIS — C541 Malignant neoplasm of endometrium: Secondary | ICD-10-CM | POA: Insufficient documentation

## 2017-11-20 MED ORDER — NITROFURANTOIN MONOHYD MACRO 100 MG PO CAPS: 100.0000 mg | ORAL_CAPSULE | Freq: Two times a day (BID) | ORAL | 1 refills | Status: DC

## 2017-11-20 NOTE — Patient Instructions (Signed)
Please notify Dr Denman George at phone number [phone] if you notice vaginal bleeding, new pelvic or abdominal pains, bloating, feeling full easy, or a change in bladder or bowel function.   Please return to see Dr Denman George in June, 2019.

## 2017-11-20 NOTE — Progress Notes (Signed)
Consult Note: Gyn-Onc  Consult was requested by Dr. Leo Grosser for the evaluation of Denise Fox 71 y.o. Fox  CC:  Chief Complaint  Patient presents with  . Endometrial cancer [facility])    Assessment/Plan:  Denise Fox  is a 71 y.o.  year old with stage IA grade 1 endoemtrial cancer, s/p robotic staging on 10/29/17. Pathology revealed low risk factors for recurrence, therefore no adjuvant therapy is recommended according to NCCN guidelines.  I discussed risk for recurrence and typical symptoms encouraged her to notify us of these should they develop between visits.  I recommend Denise Fox have follow-up every 6 months for 5 years in accordance with NCCN guidelines. Those visits should include symptom assessment, physical exam and pelvic examination. Pap smears are not indicated or recommended in the routine surveillance of endometrial cancer.  Denise Fox has symptoms of a UTI. Will empirically prescribe macrobid today and send culture.  Will see her back in 6 months, Denise Fox will see Dr Leo Grosser in 12 months.   HPI: Denise Fox is a 71 year old woman who is seen in consultation at the request of Dr Leo Grosser for a thickened endometrium on MRI.  Denise Fox was experiencing abdominal pains in August, 2018. This was worked up with an [place] by [employer] on 08/01/17 which showed non calcified gallstones and abnormal uterine contents.  Denise Fox then followed up with Dr Leo Grosser on 08/21/17 who evaluated her with biopsy which expressed a large volume of endometrial fluid. Final pathology was indeterminant because it contained only mucoid material with abundant degenerating old red blood cells. No intact endometrial tissue was seen.  Denise Fox was offered [facility] but declined this because Denise Fox is nervous about anesthesia due to a history of postoperative apneic events.   Instead Denise Fox elected for MRI of the pelvis which took place on 09/17/17 which showed a retroverted uterus with thickened endometrium and heterogeneously hypoenhancing. There were  multiple uterine fibroids measuring <2cm. No free fluid or adenopathy was seen.   Endometrial biopsy from our office on 10/07/17 showed grade 1 endometrioid endometrial cancer.  Preoperative biopsy showed endometrial cancer grade 1.   Interval Hx:  On `10/29/17 Denise Fox underwent robotic hysterectomy, BSO, SLN biopsy and cul de sac biopsy. Intraoperatively nodularity was identified on the uterine serosa and pelvic peritoneum. 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 There was no extrauterine malignancy. The tumor was a grade 1 endometrioid tumor, 5.5cm, 0.3 of 1.5cm myometrial invasion, no LVSI and normal cervix, adnexa, and lymph nodes. It was staged as stage 1 A grade 1 endometrial cancer with low risk features for recurrence, therefore, in accordance with NCCN guidelines, no adjuvant therapy is recommended.  Postop Denise Fox is reporting some dysuria that is refractory to cranberry juice.  Current Meds:  Outpatient Encounter Medications as of 11/20/2017  Medication Sig  . diclofenac sodium (VOLTAREN) 1 % GEL Apply 2 g 4 (four) times daily as needed topically (for knee/hand pain).   Marland Kitchen ibuprofen (ADVIL,MOTRIN) 800 MG tablet Take 1 tablet (800 mg total) by mouth every 8 (eight) hours as needed (mild pain).  . metoprolol succinate (TOPROL-XL) 25 MG 24 hr tablet Take 1 tablet (25 mg total) by mouth daily.  Marland Kitchen olmesartan (BENICAR) 40 MG tablet Take 1 tablet (40 mg total) by mouth daily.  Marland Kitchen senna-docusate (SENOKOT-S) 8.6-50 MG tablet Take 2 tablets by mouth at bedtime.  . nitrofurantoin, macrocrystal-monohydrate, (MACROBID) 100 MG capsule Take 1 capsule (100 mg total) by mouth 2 (two) times daily.  . [  DISCONTINUED] oxyCODONE-acetaminophen (PERCOCET/ROXICET) 5-325 MG tablet Take 1-2 tablets by mouth every 4 (four) hours as needed (moderate to severe pain (when tolerating fluids)).   No facility-administered encounter medications on file as of 11/20/2017.      Allergy:  Allergies  Allergen Reactions  . Bee Venom Anaphylaxis  . Epinephrine Other (See Comments) and Hypertension    Elevated BP/shaking/sickness.  . Atorvastatin Other (See Comments)    Unknown  . Other Other (See Comments)    Perfume - headache  . Hct [Hydrochlorothiazide] Anxiety  . Lisinopril Anxiety    Social Hx:   Social History   Socioeconomic History  . Marital status: Widowed    Spouse name: Not on file  . Number of children: Not on file  . Years of education: Not on file  . Highest education level: Not on file  Social Needs  . Financial resource strain: Not on file  . Food insecurity - worry: Not on file  . Food insecurity - inability: Not on file  . Transportation needs - medical: Not on file  . Transportation needs - non-medical: Not on file  Occupational History  . Not on file  Tobacco Use  . Smoking status: Never Smoker  . Smokeless tobacco: Never Used  Substance and Sexual Activity  . Alcohol use: Yes    Comment: rare  . Drug use: No  . Sexual activity: No  Other Topics Concern  . Not on file  Social History Narrative  . Not on file    Past Surgical Hx:  Past Surgical History:  Procedure Laterality Date  . KNEE SURGERY Bilateral 2007  . ROBOTIC ASSISTED TOTAL HYSTERECTOMY WITH BILATERAL SALPINGO OOPHERECTOMY Bilateral 10/29/2017   Procedure: XI ROBOTIC ASSISTED TOTAL HYSTERECTOMY WITH BILATERAL SALPINGO OOPHORECTOMY WITH SENTINAL LYMPH NODES, pelvic lymphadenectomy, and left ureterolysis;  Surgeon: Everitt Amber, MD;  Location: [facility];  Service: Gynecology;  Laterality: Bilateral;    Past Medical Hx:  Past Medical History:  Diagnosis Date  . Arthritis   . Complication of anesthesia    Increased heart rate, and stops breathing in her sleep the 1st 24hrs after the procedure  . Hyperlipidemia   . Hypertension   . Myocardial infarction ([place]) 2001   no stents placed  . Prolapsing mitral valve   . Right bundle branch block   . Scoliosis      Past Gynecological History:  SVDx 1 No LMP recorded. Patient is postmenopausal.  Family Hx:  Family History  Problem Relation Age of Onset  . Cancer Mother        bladder / ureater  . Hypertension Mother   . Thyroid disease Mother   . Lung disease Father   . Heart disease Father   . Glaucoma Sister     Review of Systems:  Constitutional  Feels well,    ENT Normal appearing ears and nares bilaterally Skin/Breast  No rash, sores, jaundice, itching, dryness Cardiovascular  No chest pain, shortness of breath, or edema  Pulmonary  No cough or wheeze.  Gastro Intestinal  No nausea, vomitting, or diarrhoea. No bright red blood per rectum, no abdominal pain, change in bowel movement, or constipation.  Genito Urinary  + dysuria, no bleeding Musculo Skeletal  No myalgia, arthralgia, joint swelling or pain  Neurologic  No weakness, numbness, change in gait,  Psychology  No depression, anxiety, insomnia.   Vitals:  Blood pressure 139/80, pulse 64, temperature 98 F (36.7 C), resp. rate 18, weight 175 lb 4.8 oz (79.5 kg),  SpO2 100 %.  Physical Exam: WD in NAD Neck  Supple NROM, without any enlargements.  Lymph Node Survey No cervical supraclavicular or inguinal adenopathy Cardiovascular  Pulse normal rate, regularity and rhythm. S1 and S2 normal.  Lungs  Clear to auscultation bilateraly, without wheezes/crackles/rhonchi. Good air movement.  Skin  No rash/lesions/breakdown  Psychiatry  Alert and oriented to person, place, and time  Abdomen  Normoactive bowel sounds, abdomen soft, non-tender and nonobese without evidence of hernia. Well healed incisions. Back No CVA tenderness Genito Urinary: well healed cuff, no bleeding, no masses Rectal  deferred Extremities  No bilateral cyanosis, clubbing or edema.   30 minutes of direct face to face counseling time was spent with the patient. This included discussion about prognosis, therapy recommendations and  postoperative side effects and are beyond the scope of routine postoperative care.  Donaciano Eva, MD  11/20/2017, 3:13 PM

## 2017-11-21 LAB — URINE CULTURE

## 2017-11-22 ENCOUNTER — Telehealth: Payer: Self-pay | Admitting: Gynecologic Oncology

## 2017-11-22 NOTE — Telephone Encounter (Signed)
Patient informed of urine culture results.  No concerns voiced.  Advised to call for any needs or concerns.

## 2017-11-26 HISTORY — PX: CHOLECYSTECTOMY: SHX55

## 2018-01-17 ENCOUNTER — Ambulatory Visit: Payer: Self-pay | Admitting: Orthopedic Surgery

## 2018-01-17 NOTE — H&P (Signed)
Denise Fox, Denise Fox (72yo, F) DOB 05/03/1946    Chief Complaint Bilateral Knee Pain H&P Bilateral Total Knee Arhtropalsty  Patient's Care Team Primary Care Provider: FERNANDA MOREIRA MD: [address], [place], [place] [address], Ph [phone], Fax [phone] NPI: [id number] Patient's Pharmacies [id number] ([facility]): [address], [place] [address], Ph [phone], Fax [phone]   Vitals Height - 5'3" Weight 170 BP - 132/82 Pulse - 64   Allergies Reviewed Allergies CIGARETTE SMOKE  EPINEPHRINE  HYDROCODONE: Nausea  LISINOPRIL  OXYCODONE: Nausea  PERFUME  TRAMADOL: - Does Not Tolerate  VENOM-HONEY BEE    Medications Reviewed Medications Fluad 2018-19 72yr up(PF)45 mcg(15 mcgx3)/0.5 mL intramuscular syringe INJECT 0.5ML INTRAMUSCULARLY ONCE 08/22/17   filled [employer]UTIONS ibuprofen 800 mg tablet 10/30/17   filled [employer] metoprolol succinate ER 25 mg tablet,extended release 24 hr TAKE ONE TABLET BY MOUTH ONE TIME DAILY 10/01/17   filled surescripts mometasone 0.1 % topical cream 07/08/17   filled [employer] nitrofurantoin monohydrate/macrocrystals 100 mg capsule 11/27/17   filled [employer] olmesartan 40 mg tablet start 11/26/2012 11/26/12   filled ALEXZANDREW PERKINS, PA-C oxyCODONE-acetaminophen 5 mg-325 mg tablet 10/30/17   filled [employer] Senna with Docusate Sodium 8.6 mg-50 mg tablet TAKE TWO TABLETS BY MOUTH AT BEDTIME 12/09/17   filled surescripts Suprep Bowel Prep Kit 17.5 gram-3.13 gram-1.6 gram oral solution 09/04/17   filled [employer] Toprol XL 01/16/18   entered ALEXZANDREW PERKINS, PA-C    Family History Reviewed Family History Paternal Grandfather - Heart disease Maternal Grandmother - Diabetes mellitus Father - Heart disease   - Father deceased Mother - Heart disease   - Hypertensive disorder   - Arthritis   - Mother  deceased   - Family history of cancer Maternal Grandfather - Heart disease Sister - Hypertensive disorder Paternal Grandmother - Heart disease Unspecified Relation - Glaucoma - Sibling   Social History Reviewed Social History Smoking Status: Never smoker Non-smoker Occupation: Other Chewing tobacco: none Alcohol intake: None Hand Dominance: Right Work related injury?: N Advance directive: Y (Notes: Living Will) Medical Power of Attorney: Y Marital status: Widowed   Surgical History Reviewed Surgical History Knee Surgery Hysterectomy - 10/29/2017 Colonoscopy - 10/06/2017   GYN History Most Recent Mammogram: 10/22/2017.   Past Medical History Reviewed Past Medical History Cancer: Y Heart Problems: Y High Cholesterol: Y Hypertension: Y Joint Pain: Y Osteoarthritis: Y Sleep apnea: Y Notes: Impaired Vision,  Impaired Hearing,  Tinnitus,  History of Bronchitis,  Myocardial Infarction,  Urinary Incontinence,  Mitral Vavle Prolaspe,  Right Bundle Branch Block,  Endometrial Cancer,  Osteopenia,  Postmenopausal    HPI Patient is a 72 year old female scheduled for bilateral total knees on 01/29/2018 by Dr. Aluisio at [facility]. The patient reports left knee and right knee symptoms including: pain, swelling, stiffness and soreness which began year(s) ago without any known injury. The patient describes the severity of the symptoms as moderate in severity. The patient feels that the symptoms are worsening. Symptoms are reported to be located in the left anterior knee and right anterior knee and include knee pain, swelling, stiffness, difficulty bearing weight and difficulty ambulating. The patient does not report any radiation of symptoms. Symptoms are exacerbated by motion at the knee, weight bearing and walking. Symptoms are relieved by rest. She has been having trouble for 20 years. She has bilateral knee scopes and meniscal debridement in 2009. She has had  viscosupplementation in   the past with no relief. No cortisone injections in the knees but she is at a point where she wants to have the knees replaced. She reports that her quality of life is greatly affected. She says that the right knee hurts more but she has more weakness in the left knee. AP both knees and lateral shows she has bone-on-bone patellofemoral in both knees with significant medial narrowing. She has large osteophyte formation. She has advanced arthritis in both knees with significant pain and dysfunction.Patient has significant pain and dysfunction in their knee. They have had injections and failed nonoperative management. At this point the pain and dysfunction have essentially taken over their life. The most predictable means of improving pain and function is total knee arthroplasty. We discussed the procedure risks and potential complications and rehab course in detail and the patient would like to go ahead and proceed with bilateral total knee arthroplasty.   ROS Constitutional: Constitutional: fatigue.  Cardiovascular: Cardiovascular: no palpitations or chest pain; positive for shortness of breath.  Respiratory: Respiratory: no cough, shortness of breath, and No COPD.  Gastrointestinal: Gastrointestinal: no vomiting or nausea.  Musculoskeletal: Musculoskeletal: Joint Pain and swelling in Joints; weakness, stiffness.  Neurologic: Neurologic: tingling/parasthesias and difficulty with balance and no numbness.   Physical Exam Patient is a 72 year old female.  General Mental Status - Alert, cooperative and good historian. General Appearance - pleasant, Not in acute distress. Orientation - Oriented X3. Build & Nutrition - Well nourished and Well developed.  Head and Neck Head - normocephalic, atraumatic . Neck Global Assessment - supple, no bruit auscultated on the right, no bruit auscultated on the left.  Eye wears glasses Pupil - Bilateral - PERR Motion - Bilateral  - EOMI.  Chest and Lung Exam Auscultation Breath sounds - clear at anterior chest wall and clear at posterior chest wall. Adventitious sounds - No Adventitious sounds.  Cardiovascular Auscultation Rhythm - Regular rate and rhythm. Heart Sounds - S1 WNL and S2 WNL. Murmurs & Other Heart Sounds - Auscultation of the heart reveals - No Murmurs.  Abdomen Palpation/Percussion Tenderness - Abdomen is non-tender to palpation. Abdomen is soft. Auscultation Auscultation of the abdomen reveals - Bowel sounds normal.  Genitourinary Note: Not done, not pertinent to present illness  Musculoskeletal Left hip shows flexion to 120 rotation in 30 out 40 and abduction 40 without discomfort. There is no tenderness over the greater trochanter. There is no pain on provocative testing of the hip. Right hip shows flexion to 120 rotation in 30 abduction 40 and external rotation of 40. There is no tenderness over the greater trochanter. There is no pain on provocative testing of the hip. RIGHT knee shows no effusion. Range of motion 5-125. She is tender medial greater than lateral. There is no instability noted. LEFT knee no effusion. Range of motion is 5-125. She is tender medial greater than lateral. There is no instability noted. Pulses, sensation and motor are intact both lower extremities. Gait pattern is antalgic both sides. Radiographs AP both knees and lateral shows she has bone-on-bone patellofemoral in both knees with significant medial narrowing. She has large osteophyte formation.   Assessment / Plan 1. Osteoarthritis of left knee joint M17.12: Unilateral primary osteoarthritis, left knee  2. Osteoarthritis of right knee joint M17.11: Unilateral primary osteoarthritis, right knee  Patient Instructions Surgical Plans: Bilateral Total Knee Arthroplasty Disposition: [facility] - Wants to look into [place] PCP: Dr. Mellody Drown Cards: Dr. Linard Millers Topical TXA Anesthesia Issues: Sleep Apnea Patient  was instructed  on what medications to stop prior to surgery. - Follow up visit in 2 weeks with Dr. Wynelle Link - Begin physical therapy following surgery - Pre-operative lab work as pre Pre-Surgical Testing - Prescriptions will be provided in hospital at time of discharge  Return to [place], MD for Power at [facility] on 02/11/2018 at 01:45 PM  Encounter signed-off by Mickel Crow, PA-C

## 2018-01-17 NOTE — H&P (View-Only) (Signed)
Denise Fox, Denise Fox (72yo, F) DOB February 02, 1946    Chief Complaint Bilateral Knee Pain H&P Bilateral Total Knee Arhtropalsty  Patient's Care Team Primary Care Provider: Thornton Dales MD: [place], [place], [place] [address], Ph [phone], Fax [phone] NPI: [id number] Patient's Pharmacies [facility] [phone] [facility]): [address], [place] [address], Ph [phone], Fax [phone]   Vitals Height - 5'3" Weight 170 BP - 132/82 Pulse - 64   Allergies Reviewed Allergies CIGARETTE SMOKE  EPINEPHRINE  HYDROCODONE: Nausea  LISINOPRIL  OXYCODONE: Nausea  PERFUME  TRAMADOL: - Does Not Tolerate  VENOM-HONEY BEE    Medications Reviewed Medications Fluad 2018-19 31yrup(PF)45 mcg(15 mcgx3)/0.5 mL intramuscular syringe INJECT 0.5ML INTRAMUSCULARLY ONCE 08/22/17   filled PRES[employer] SOLUTIONS ibuprofen 800 mg tablet 10/30/17   filled [employer]IONS metoprolol succinate ER 25 mg tablet,extended release 24 hr TAKE ONE TABLET BY MOUTH ONE TIME DAILY 10/01/17   filled surescripts mometasone 0.1 % topical cream 07/08/17   filled [employer]IONS nitrofurantoin monohydrate/macrocrystals 100 mg capsule 11/27/17   filled [employer] SOLUTIONS olmesartan 40 mg tablet start 11/26/2012 11/26/12   filled Quantae Martel, PA-C oxyCODONE-acetaminophen 5 mg-325 mg tablet 10/30/17   filled [place] Docusate Sodium 8.6 mg-50 mg tablet TAKE TWO TABLETS BY MOUTH AT BEDTIME 12/09/17   filled surescripts Suprep Bowel Prep Kit 17.5 gram-3.13 gram-1.6 gram oral solution 09/04/17   filled [employer] SOLUTIONS Toprol XL 01/16/18   entered AMinot PA-C    Family History Reviewed Family History Paternal Grandfather - Heart disease Maternal Grandmother - Diabetes mellitus Father - Heart disease   - Father deceased Mother - Heart disease   - Hypertensive disorder   - Arthritis   - Mother  deceased   - Family history of cancer Maternal Grandfather - Heart disease Sister - Hypertensive disorder Paternal Grandmother - Heart disease Unspecified Relation - Glaucoma - Sibling   Social History Reviewed Social History Smoking Status: Never smoker Non-smoker Occupation: Other Chewing tobacco: none Alcohol intake: None Hand Dominance: Right Work related injury?: N Advance directive: Y (Notes: Living Will) Medical Power of Attorney: Y Marital status: Widowed   Surgical History Reviewed Surgical History Knee Surgery Hysterectomy - 10/29/2017 Colonoscopy - 10/06/2017   GYN History Most Recent Mammogram: 10/22/2017.   Past Medical History Reviewed Past Medical History Cancer: Y Heart Problems: Y High Cholesterol: Y Hypertension: Y Joint Pain: Y Osteoarthritis: Y Sleep apnea: Y Notes: Impaired Vision,  Impaired Hearing,  Tinnitus,  History of Bronchitis,  Myocardial Infarction,  Urinary Incontinence,  Mitral Vavle Prolaspe,  Right Bundle Branch Block,  Endometrial Cancer,  Osteopenia,  Postmenopausal    HPI Patient is a 72year old female scheduled for bilateral total knees on 01/29/2018 [facility] The patient reports left knee and right knee symptoms including: pain, swelling, stiffness and soreness which began year(s) ago without any known injury. The patient describes the severity of the symptoms as moderate in severity. The patient feels that the symptoms are worsening. Symptoms are reported to be located in the left anterior knee and right anterior knee and include knee pain, swelling, stiffness, difficulty bearing weight and difficulty ambulating. The patient does not report any radiation of symptoms. Symptoms are exacerbated by motion at the knee, weight bearing and walking. Symptoms are relieved by rest. She has been having trouble for 20 years. She has bilateral knee scopes and meniscal debridement in 2009. She has had  viscosupplementation in  the past with no relief. No cortisone injections in the knees but she is at a point where she wants to have the knees replaced. She reports that her quality of life is greatly affected. She says that the right knee hurts more but she has more weakness in the left knee. AP both knees and lateral shows she has bone-on-bone patellofemoral in both knees with significant medial narrowing. She has large osteophyte formation. She has advanced arthritis in both knees with significant pain and dysfunction.Patient has significant pain and dysfunction in their knee. They have had injections and failed nonoperative management. At this point the pain and dysfunction have essentially taken over their life. The most predictable means of improving pain and function is total knee arthroplasty. We discussed the procedure risks and potential complications and rehab course in detail and the patient would like to go ahead and proceed with bilateral total knee arthroplasty.   ROS Constitutional: Constitutional: fatigue.  Cardiovascular: Cardiovascular: no palpitations or chest pain; positive for shortness of breath.  Respiratory: Respiratory: no cough, shortness of breath, and No COPD.  Gastrointestinal: Gastrointestinal: no vomiting or nausea.  Musculoskeletal: Musculoskeletal: Joint Pain and swelling in Joints; weakness, stiffness.  Neurologic: Neurologic: tingling/parasthesias and difficulty with balance and no numbness.   Physical Exam Patient is a 72 year old female.  General Mental Status - Alert, cooperative and good historian. General Appearance - pleasant, Not in acute distress. Orientation - Oriented X3. Build & Nutrition - Well nourished and Well developed.  Head and Neck Head - normocephalic, atraumatic . Neck Global Assessment - supple, no bruit auscultated on the right, no bruit auscultated on the left.  Eye wears glasses Pupil - Bilateral - PERR Motion - Bilateral  - EOMI.  Chest and Lung Exam Auscultation Breath sounds - clear at anterior chest wall and clear at posterior chest wall. Adventitious sounds - No Adventitious sounds.  Cardiovascular Auscultation Rhythm - Regular rate and rhythm. Heart Sounds - S1 WNL and S2 WNL. Murmurs & Other Heart Sounds - Auscultation of the heart reveals - No Murmurs.  Abdomen Palpation/Percussion Tenderness - Abdomen is non-tender to palpation. Abdomen is soft. Auscultation Auscultation of the abdomen reveals - Bowel sounds normal.  Genitourinary Note: Not done, not pertinent to present illness  Musculoskeletal Left hip shows flexion to 120 rotation in 30 out 40 and abduction 40 without discomfort. There is no tenderness over the greater trochanter. There is no pain on provocative testing of the hip. Right hip shows flexion to 120 rotation in 30 abduction 40 and external rotation of 40. There is no tenderness over the greater trochanter. There is no pain on provocative testing of the hip. RIGHT knee shows no effusion. Range of motion 5-125. She is tender medial greater than lateral. There is no instability noted. LEFT knee no effusion. Range of motion is 5-125. She is tender medial greater than lateral. There is no instability noted. Pulses, sensation and motor are intact both lower extremities. Gait pattern is antalgic both sides. Radiographs AP both knees and lateral shows she has bone-on-bone patellofemoral in both knees with significant medial narrowing. She has large osteophyte formation.   Assessment / Plan 1. Osteoarthritis of left knee joint M17.12: Unilateral primary osteoarthritis, left knee  2. Osteoarthritis of right knee joint M17.11: Unilateral primary osteoarthritis, right knee  Patient Instructions Surgical Plans: Bilateral Total Knee Arthroplasty Disposition: [facility] - Wants to look into [place] PCP: Dr. Mellody Drown Cards: Dr. Linard Millers Topical TXA Anesthesia Issues: Sleep Apnea Patient  was instructed  on what medications to stop prior to surgery. - Follow up visit in 2 weeks with Dr. Wynelle Link - Begin physical therapy following surgery - Pre-operative lab work as pre Pre-Surgical Testing - Prescriptions will be provided in hospital at time of discharge  Return to [place], MD for Power at [facility] on 02/11/2018 at 01:45 PM  Encounter signed-off by Mickel Crow, PA-C

## 2018-01-17 NOTE — Patient Instructions (Signed)
Denise Fox  01/17/2018   Your procedure is scheduled on: 01-29-18   Report to [facility] Main  Entrance Report to Admitting at 9:00  AM   Call this number if you have problems the morning of surgery [phone]   Remember: Do not eat food or drink liquids :After Midnight.     Take these medicines the morning of surgery with A SIP OF WATER: Metoprolol Succinate (Toprol-XL)                                You may not have any metal on your body including hair pins and              piercings  Do not wear jewelry, make-up, lotions, powders or perfumes, deodorant             Do not wear nail polish.  Do not shave  48 hours prior to surgery.                Do not bring valuables to the hospital. [place].  Contacts, dentures or bridgework may not be worn into surgery.  Leave suitcase in the car. After surgery it may be brought to your room.                Please read over the following fact sheets you were given: _____________________________________________________________________          [facility] - Preparing for Surgery Before surgery, you can play an important role.  Because skin is not sterile, your skin needs to be as free of germs as possible.  You can reduce the number of germs on your skin by washing with CHG (chlorahexidine gluconate) soap before surgery.  CHG is an antiseptic cleaner which kills germs and bonds with the skin to continue killing germs even after washing. Please DO NOT use if you have an allergy to CHG or antibacterial soaps.  If your skin becomes reddened/irritated stop using the CHG and inform your nurse when you arrive at Short Stay. Do not shave (including legs and underarms) for at least 48 hours prior to the first CHG shower.  You may shave your face/neck. Please follow these instructions carefully:  1.  Shower with CHG Soap the night before surgery and the  morning of Surgery.  2.   If you choose to wash your hair, wash your hair first as usual with your  normal  shampoo.  3.  After you shampoo, rinse your hair and body thoroughly to remove the  shampoo.                           4.  Use CHG as you would any other liquid soap.  You can apply chg directly  to the skin and wash                       Gently with a scrungie or clean washcloth.  5.  Apply the CHG Soap to your body ONLY FROM THE NECK DOWN.   Do not use on face/ open  Wound or open sores. Avoid contact with eyes, ears mouth and genitals (private parts).                       Wash face,  Genitals (private parts) with your normal soap.             6.  Wash thoroughly, paying special attention to the area where your surgery  will be performed.  7.  Thoroughly rinse your body with warm water from the neck down.  8.  DO NOT shower/wash with your normal soap after using and rinsing off  the CHG Soap.                9.  Pat yourself dry with a clean towel.            10.  Wear clean pajamas.            11.  Place clean sheets on your bed the night of your first shower and do not  sleep with pets. Day of Surgery : Do not apply any lotions/deodorants the morning of surgery.  Please wear clean clothes to the hospital/surgery center.  FAILURE TO FOLLOW THESE INSTRUCTIONS MAY RESULT IN THE CANCELLATION OF YOUR SURGERY PATIENT SIGNATURE_________________________________  NURSE SIGNATURE__________________________________  ________________________________________________________________________   Adam Phenix  An incentive spirometer is a tool that can help keep your lungs clear and active. This tool measures how well you are filling your lungs with each breath. Taking long deep breaths may help reverse or decrease the chance of developing breathing (pulmonary) problems (especially infection) following:  A long period of time when you are unable to move or be active. BEFORE THE PROCEDURE    If the spirometer includes an indicator to show your best effort, your nurse or respiratory therapist will set it to a desired goal.  If possible, sit up straight or lean slightly forward. Try not to slouch.  Hold the incentive spirometer in an upright position. INSTRUCTIONS FOR USE  1. Sit on the edge of your bed if possible, or sit up as far as you can in bed or on a chair. 2. Hold the incentive spirometer in an upright position. 3. Breathe out normally. 4. Place the mouthpiece in your mouth and seal your lips tightly around it. 5. Breathe in slowly and as deeply as possible, raising the piston or the ball toward the top of the column. 6. Hold your breath for 3-5 seconds or for as long as possible. Allow the piston or ball to fall to the bottom of the column. 7. Remove the mouthpiece from your mouth and breathe out normally. 8. Rest for a few seconds and repeat Steps 1 through 7 at least 10 times every 1-2 hours when you are awake. Take your time and take a few normal breaths between deep breaths. 9. The spirometer may include an indicator to show your best effort. Use the indicator as a goal to work toward during each repetition. 10. After each set of 10 deep breaths, practice coughing to be sure your lungs are clear. If you have an incision (the cut made at the time of surgery), support your incision when coughing by placing a pillow or rolled up towels firmly against it. Once you are able to get out of bed, walk around indoors and cough well. You may stop using the incentive spirometer when instructed by your caregiver.  RISKS AND COMPLICATIONS  Take your time so you do not get  dizzy or light-headed.  If you are in pain, you may need to take or ask for pain medication before doing incentive spirometry. It is harder to take a deep breath if you are having pain. AFTER USE  Rest and breathe slowly and easily.  It can be helpful to keep track of a log of your progress. Your caregiver  can provide you with a simple table to help with this. If you are using the spirometer at home, follow these instructions: [place] IF:   You are having difficultly using the spirometer.  You have trouble using the spirometer as often as instructed.  Your pain medication is not giving enough relief while using the spirometer.  You develop fever of 100.5 F (38.1 C) or higher. SEEK IMMEDIATE MEDICAL CARE IF:   You cough up bloody sputum that had not been present before.  You develop fever of 102 F (38.9 C) or greater.  You develop worsening pain at or near the incision site. MAKE SURE YOU:   Understand these instructions.  Will watch your condition.  Will get help right away if you are not doing well or get worse. Document Released: 03/25/2007 Document Revised: 02/04/2012 Document Reviewed: 05/26/2007 ExitCare Patient Information 2014 ExitCare, [place].   ________________________________________________________________________  WHAT IS A BLOOD TRANSFUSION? Blood Transfusion Information  A transfusion is the replacement of blood or some of its parts. Blood is made up of multiple cells which provide different functions.  Red blood cells carry oxygen and are used for blood loss replacement.  White blood cells fight against infection.  Platelets control bleeding.  Plasma helps clot blood.  Other blood products are available for specialized needs, such as hemophilia or other clotting disorders. BEFORE THE TRANSFUSION  Who gives blood for transfusions?   Healthy volunteers who are fully evaluated to make sure their blood is safe. This is blood bank blood. Transfusion therapy is the safest it has ever been in the practice of medicine. Before blood is taken from a donor, a complete history is taken to make sure that person has no history of diseases nor engages in risky social behavior (examples are intravenous drug use or sexual activity with multiple partners). The  donor's travel history is screened to minimize risk of transmitting infections, such as malaria. The donated blood is tested for signs of infectious diseases, such as HIV and hepatitis. The blood is then tested to be sure it is compatible with you in order to minimize the chance of a transfusion reaction. If you or a relative donates blood, this is often done in anticipation of surgery and is not appropriate for emergency situations. It takes many days to process the donated blood. RISKS AND COMPLICATIONS Although transfusion therapy is very safe and saves many lives, the main dangers of transfusion include:   Getting an infectious disease.  Developing a transfusion reaction. This is an allergic reaction to something in the blood you were given. Every precaution is taken to prevent this. The decision to have a blood transfusion has been considered carefully by your caregiver before blood is given. Blood is not given unless the benefits outweigh the risks. AFTER THE TRANSFUSION  Right after receiving a blood transfusion, you will usually feel much better and more energetic. This is especially true if your red blood cells have gotten low (anemic). The transfusion raises the level of the red blood cells which carry oxygen, and this usually causes an energy increase.  The nurse administering the transfusion will  monitor you carefully for complications. HOME CARE INSTRUCTIONS  No special instructions are needed after a transfusion. You may find your energy is better. Speak with your caregiver about any limitations on activity for underlying diseases you may have. SEEK MEDICAL CARE IF:   Your condition is not improving after your transfusion.  You develop redness or irritation at the intravenous (IV) site. SEEK IMMEDIATE MEDICAL CARE IF:  Any of the following symptoms occur over the next 12 hours:  Shaking chills.  You have a temperature by mouth above 102 F (38.9 C), not controlled by  medicine.  Chest, back, or muscle pain.  People around you feel you are not acting correctly or are confused.  Shortness of breath or difficulty breathing.  Dizziness and fainting.  You get a rash or develop hives.  You have a decrease in urine output.  Your urine turns a dark color or changes to pink, red, or brown. Any of the following symptoms occur over the next 10 days:  You have a temperature by mouth above 102 F (38.9 C), not controlled by medicine.  Shortness of breath.  Weakness after normal activity.  The white part of the eye turns yellow (jaundice).  You have a decrease in the amount of urine or are urinating less often.  Your urine turns a dark color or changes to pink, red, or brown. Document Released: 11/09/2000 Document Revised: 02/04/2012 Document Reviewed: 06/28/2008 [facility] Patient Information 2014 [place], [place].  _______________________________________________________________________

## 2018-01-20 ENCOUNTER — Encounter (HOSPITAL_COMMUNITY): Payer: Self-pay

## 2018-01-20 ENCOUNTER — Other Ambulatory Visit: Payer: Self-pay

## 2018-01-20 ENCOUNTER — Encounter (HOSPITAL_COMMUNITY)
Admission: RE | Admit: 2018-01-20 | Discharge: 2018-01-20 | Disposition: A | Discharge status: Home / Self Care | Payer: Medicare Other | Source: Ambulatory Visit | Attending: Orthopedic Surgery | Admitting: Orthopedic Surgery

## 2018-01-20 DIAGNOSIS — Z01812 Encounter for preprocedural laboratory examination: Secondary | ICD-10-CM | POA: Diagnosis not present

## 2018-01-20 DIAGNOSIS — I341 Nonrheumatic mitral (valve) prolapse: Secondary | ICD-10-CM | POA: Insufficient documentation

## 2018-01-20 LAB — COMPREHENSIVE METABOLIC PANEL
ALBUMIN: 3.9 g/dL (ref 3.5–5.0)
ALT: 22 U/L (ref 14–54)
AST: 20 U/L (ref 15–41)
Alkaline Phosphatase: 85 U/L (ref 38–126)
Anion gap: 8 (ref 5–15)
BUN: 24 mg/dL — ABNORMAL HIGH (ref 6–20)
CALCIUM: 9.8 mg/dL (ref 8.9–10.3)
CHLORIDE: 108 mmol/L (ref 101–111)
CO2: 25 mmol/L (ref 22–32)
CREATININE: 0.81 mg/dL (ref 0.44–1.00)
GFR calc Af Amer: >60 mL/min (ref >60)
GLUCOSE: 104 mg/dL — AB (ref 65–99)
POTASSIUM: 4.6 mmol/L (ref 3.5–5.1)
SODIUM: 141 mmol/L (ref 135–145)
TOTAL PROTEIN: 7.1 g/dL (ref 6.5–8.1)
Total Bilirubin: 0.5 mg/dL (ref 0.3–1.2)

## 2018-01-20 LAB — SURGICAL PCR SCREEN
MRSA, PCR: NEGATIVE
Staphylococcus aureus: POSITIVE — AB

## 2018-01-20 LAB — CBC
HCT: 44.3 % (ref 36.0–46.0)
Hemoglobin: 14.5 g/dL (ref 12.0–15.0)
MCH: 29.4 pg (ref 26.0–34.0)
MCHC: 32.7 g/dL (ref 30.0–36.0)
MCV: 89.9 fL (ref 78.0–100.0)
PLATELETS: 269 10*3/uL (ref 150–400)
RBC: 4.93 MIL/uL (ref 3.87–5.11)
RDW: 14.2 % (ref 11.5–15.5)
WBC: 8.4 10*3/uL (ref 4.0–10.5)

## 2018-01-20 LAB — APTT: APTT: 31 s (ref 24–36)

## 2018-01-20 LAB — PROTIME-INR
INR: 1
Prothrombin Time: 13.1 seconds (ref 11.4–15.2)

## 2018-01-20 NOTE — Progress Notes (Signed)
01-20-18 CMP result routed to Dr. Wynelle Link for review.

## 2018-01-20 NOTE — Progress Notes (Signed)
10-08-17 (Epic) Cardiac Clearance from Dr. Tamala Julian, EKG   10-11-16 (Epic) Stress

## 2018-01-21 NOTE — Progress Notes (Signed)
01-20-18 PCR result routed to Dr. Wynelle Link

## 2018-01-28 MED ORDER — TRANEXAMIC ACID 1000 MG/10ML IV SOLN
2000.0000 mg | Freq: Once | INTRAVENOUS | Status: DC
  Filled 2018-01-28: qty 20

## 2018-01-29 ENCOUNTER — Inpatient Hospital Stay (HOSPITAL_COMMUNITY)
Admission: RE | Admit: 2018-01-29 | Discharge: 2018-02-03 | DRG: 462 | Disposition: A | Discharge status: Skilled Nursing Facility | Payer: Medicare Other | Source: Ambulatory Visit | Attending: Orthopedic Surgery | Admitting: Orthopedic Surgery

## 2018-01-29 ENCOUNTER — Other Ambulatory Visit: Payer: Self-pay

## 2018-01-29 ENCOUNTER — Encounter (HOSPITAL_COMMUNITY): Payer: Self-pay | Admitting: Certified Registered Nurse Anesthetist

## 2018-01-29 ENCOUNTER — Encounter (HOSPITAL_COMMUNITY)
Admission: RE | Disposition: A | Discharge status: Skilled Nursing Facility | Payer: Self-pay | Source: Ambulatory Visit | Attending: Orthopedic Surgery

## 2018-01-29 ENCOUNTER — Inpatient Hospital Stay (HOSPITAL_COMMUNITY): Payer: Medicare Other | Admitting: Anesthesiology

## 2018-01-29 DIAGNOSIS — I10 Essential (primary) hypertension: Secondary | ICD-10-CM | POA: Diagnosis present

## 2018-01-29 DIAGNOSIS — I2510 Atherosclerotic heart disease of native coronary artery without angina pectoris: Secondary | ICD-10-CM | POA: Diagnosis present

## 2018-01-29 DIAGNOSIS — Z7982 Long term (current) use of aspirin: Secondary | ICD-10-CM

## 2018-01-29 DIAGNOSIS — M8580 Other specified disorders of bone density and structure, unspecified site: Secondary | ICD-10-CM | POA: Diagnosis present

## 2018-01-29 DIAGNOSIS — Z91030 Bee allergy status: Secondary | ICD-10-CM | POA: Diagnosis not present

## 2018-01-29 DIAGNOSIS — I252 Old myocardial infarction: Secondary | ICD-10-CM

## 2018-01-29 DIAGNOSIS — Z91048 Other nonmedicinal substance allergy status: Secondary | ICD-10-CM | POA: Diagnosis not present

## 2018-01-29 DIAGNOSIS — G4730 Sleep apnea, unspecified: Secondary | ICD-10-CM | POA: Diagnosis present

## 2018-01-29 DIAGNOSIS — Z8542 Personal history of malignant neoplasm of other parts of uterus: Secondary | ICD-10-CM

## 2018-01-29 DIAGNOSIS — Z79899 Other long term (current) drug therapy: Secondary | ICD-10-CM | POA: Diagnosis not present

## 2018-01-29 DIAGNOSIS — M170 Bilateral primary osteoarthritis of knee: Principal | ICD-10-CM | POA: Diagnosis present

## 2018-01-29 DIAGNOSIS — K5900 Constipation, unspecified: Secondary | ICD-10-CM | POA: Diagnosis present

## 2018-01-29 DIAGNOSIS — M179 Osteoarthritis of knee, unspecified: Secondary | ICD-10-CM

## 2018-01-29 DIAGNOSIS — M1710 Unilateral primary osteoarthritis, unspecified knee: Secondary | ICD-10-CM | POA: Diagnosis present

## 2018-01-29 DIAGNOSIS — E7800 Pure hypercholesterolemia, unspecified: Secondary | ICD-10-CM | POA: Diagnosis present

## 2018-01-29 DIAGNOSIS — Z888 Allergy status to other drugs, medicaments and biological substances status: Secondary | ICD-10-CM | POA: Diagnosis not present

## 2018-01-29 HISTORY — PX: TOTAL KNEE ARTHROPLASTY: SHX125

## 2018-01-29 LAB — TYPE AND SCREEN
ABO/RH(D): A POS
Antibody Screen: NEGATIVE

## 2018-01-29 SURGERY — ARTHROPLASTY, KNEE, BILATERAL, TOTAL
Anesthesia: Spinal | Site: Knee | Laterality: Bilateral

## 2018-01-29 MED ORDER — ONDANSETRON HCL 4 MG/2ML IJ SOLN: 4.0000 mg | Freq: Four times a day (QID) | INTRAMUSCULAR | Status: DC | PRN

## 2018-01-29 MED ORDER — PROPOFOL 500 MG/50ML IV EMUL
INTRAVENOUS | Status: DC | PRN
  Administered 2018-01-29: 100 ug/kg/min via INTRAVENOUS

## 2018-01-29 MED ORDER — PROMETHAZINE HCL 25 MG/ML IJ SOLN
6.2500 mg | INTRAMUSCULAR | Status: DC | PRN
Start: 2018-01-29 — End: 2018-01-29

## 2018-01-29 MED ORDER — EPHEDRINE SULFATE-NACL 50-0.9 MG/10ML-% IV SOSY
PREFILLED_SYRINGE | INTRAVENOUS | Status: DC | PRN
Start: 2018-01-29 — End: 2018-01-29
  Administered 2018-01-29: 15 mg via INTRAVENOUS
  Administered 2018-01-29 (×3): 5 mg via INTRAVENOUS

## 2018-01-29 MED ORDER — ONDANSETRON HCL 4 MG/2ML IJ SOLN
INTRAMUSCULAR | Status: AC
  Filled 2018-01-29: qty 2

## 2018-01-29 MED ORDER — POLYETHYLENE GLYCOL 3350 17 G PO PACK: 17.0000 g | PACK | Freq: Every day | ORAL | Status: DC | PRN

## 2018-01-29 MED ORDER — SODIUM CHLORIDE 0.9 % IV SOLN
INTRAVENOUS | Status: DC
  Administered 2018-01-29 – 2018-02-01 (×7): via INTRAVENOUS

## 2018-01-29 MED ORDER — METOPROLOL SUCCINATE ER 25 MG PO TB24
25.0000 mg | ORAL_TABLET | Freq: Every day | ORAL | Status: DC
  Administered 2018-01-30 – 2018-02-03 (×5): 25 mg via ORAL
  Filled 2018-01-29 (×4): qty 1

## 2018-01-29 MED ORDER — PHENYLEPHRINE 40 MCG/ML (10ML) SYRINGE FOR IV PUSH (FOR BLOOD PRESSURE SUPPORT)
PREFILLED_SYRINGE | INTRAVENOUS | Status: DC | PRN
  Administered 2018-01-29: 80 ug via INTRAVENOUS

## 2018-01-29 MED ORDER — CHLORHEXIDINE GLUCONATE 4 % EX LIQD: 60.0000 mL | Freq: Once | CUTANEOUS | Status: DC

## 2018-01-29 MED ORDER — METHOCARBAMOL 1000 MG/10ML IJ SOLN
500.0000 mg | Freq: Four times a day (QID) | INTRAVENOUS | Status: DC | PRN
  Filled 2018-01-29 (×2): qty 5

## 2018-01-29 MED ORDER — PROPOFOL 10 MG/ML IV BOLUS
INTRAVENOUS | Status: AC
  Filled 2018-01-29: qty 60

## 2018-01-29 MED ORDER — DIPHENHYDRAMINE HCL 12.5 MG/5ML PO ELIX: 12.5000 mg | ORAL_SOLUTION | ORAL | Status: DC | PRN

## 2018-01-29 MED ORDER — NALOXEGOL OXALATE 25 MG PO TABS
25.0000 mg | ORAL_TABLET | Freq: Every day | ORAL | Status: DC
  Administered 2018-01-30 – 2018-02-03 (×5): 25 mg via ORAL
  Filled 2018-01-29 (×5): qty 1

## 2018-01-29 MED ORDER — BUPIVACAINE HCL (PF) 0.5 % IJ SOLN
INTRAMUSCULAR | Status: DC | PRN
  Administered 2018-01-29: 3 mL via INTRATHECAL

## 2018-01-29 MED ORDER — OXYCODONE HCL 5 MG PO TABS: 10.0000 mg | ORAL_TABLET | ORAL | Status: DC | PRN

## 2018-01-29 MED ORDER — CEFAZOLIN SODIUM-DEXTROSE 2-4 GM/100ML-% IV SOLN
2.0000 g | INTRAVENOUS | Status: AC
  Administered 2018-01-29: 2 g via INTRAVENOUS
  Filled 2018-01-29: qty 100

## 2018-01-29 MED ORDER — BISACODYL 10 MG RE SUPP
10.0000 mg | Freq: Every day | RECTAL | Status: DC | PRN
  Administered 2018-02-01 – 2018-02-02 (×2): 10 mg via RECTAL
  Filled 2018-01-29 (×2): qty 1

## 2018-01-29 MED ORDER — DEXAMETHASONE SODIUM PHOSPHATE 10 MG/ML IJ SOLN
INTRAMUSCULAR | Status: AC
  Filled 2018-01-29: qty 1

## 2018-01-29 MED ORDER — STERILE WATER FOR IRRIGATION IR SOLN
Status: DC | PRN
  Administered 2018-01-29: 2000 mL

## 2018-01-29 MED ORDER — METOCLOPRAMIDE HCL 5 MG PO TABS: 5.0000 mg | ORAL_TABLET | Freq: Three times a day (TID) | ORAL | Status: DC | PRN

## 2018-01-29 MED ORDER — OXYCODONE HCL 5 MG PO TABS
5.0000 mg | ORAL_TABLET | ORAL | Status: DC | PRN
  Administered 2018-01-30: 11:00:00 10 mg via ORAL
  Filled 2018-01-29: qty 2

## 2018-01-29 MED ORDER — HYDROMORPHONE HCL 1 MG/ML IJ SOLN: 0.5000 mg | INTRAMUSCULAR | Status: DC | PRN

## 2018-01-29 MED ORDER — GABAPENTIN 300 MG PO CAPS
300.0000 mg | ORAL_CAPSULE | Freq: Once | ORAL | Status: AC
  Administered 2018-01-29: 300 mg via ORAL
  Filled 2018-01-29: qty 1

## 2018-01-29 MED ORDER — ONDANSETRON HCL 4 MG/2ML IJ SOLN
INTRAMUSCULAR | Status: DC | PRN
  Administered 2018-01-29: 4 mg via INTRAVENOUS

## 2018-01-29 MED ORDER — MENTHOL 3 MG MT LOZG
1.0000 | LOZENGE | OROMUCOSAL | Status: DC | PRN
  Administered 2018-01-30: 3 mg via ORAL
  Filled 2018-01-29: qty 9

## 2018-01-29 MED ORDER — ROPIVACAINE HCL 2 MG/ML IJ SOLN
10.0000 mL/h | INTRAMUSCULAR | Status: AC
  Administered 2018-01-29 – 2018-01-31 (×4): 10 mL/h via EPIDURAL
  Filled 2018-01-29 (×6): qty 200

## 2018-01-29 MED ORDER — PHENYLEPHRINE 40 MCG/ML (10ML) SYRINGE FOR IV PUSH (FOR BLOOD PRESSURE SUPPORT)
PREFILLED_SYRINGE | INTRAVENOUS | Status: AC
  Filled 2018-01-29: qty 10

## 2018-01-29 MED ORDER — PROPOFOL 10 MG/ML IV BOLUS
INTRAVENOUS | Status: DC | PRN
  Administered 2018-01-29: 25 mg via INTRAVENOUS
  Administered 2018-01-29: 50 mg via INTRAVENOUS

## 2018-01-29 MED ORDER — DEXAMETHASONE SODIUM PHOSPHATE 10 MG/ML IJ SOLN
10.0000 mg | Freq: Once | INTRAMUSCULAR | Status: AC
  Administered 2018-01-30: 10:00:00 10 mg via INTRAVENOUS
  Filled 2018-01-29: qty 1

## 2018-01-29 MED ORDER — LACTATED RINGERS IV SOLN
INTRAVENOUS | Status: DC
  Administered 2018-01-29: 1000 mL via INTRAVENOUS
  Administered 2018-01-29: 10:00:00 via INTRAVENOUS

## 2018-01-29 MED ORDER — SODIUM CHLORIDE 0.9 % IR SOLN
Status: DC | PRN
  Administered 2018-01-29: 1000 mL

## 2018-01-29 MED ORDER — FENTANYL CITRATE (PF) 100 MCG/2ML IJ SOLN: 25.0000 ug | INTRAMUSCULAR | Status: DC | PRN

## 2018-01-29 MED ORDER — ACETAMINOPHEN 500 MG PO TABS
1000.0000 mg | ORAL_TABLET | Freq: Four times a day (QID) | ORAL | Status: AC
  Administered 2018-01-29 – 2018-01-30 (×4): 1000 mg via ORAL
  Filled 2018-01-29 (×4): qty 2

## 2018-01-29 MED ORDER — ONDANSETRON HCL 4 MG PO TABS: 4.0000 mg | ORAL_TABLET | Freq: Four times a day (QID) | ORAL | Status: DC | PRN

## 2018-01-29 MED ORDER — DOCUSATE SODIUM 100 MG PO CAPS
100.0000 mg | ORAL_CAPSULE | Freq: Two times a day (BID) | ORAL | Status: DC
  Administered 2018-01-29 – 2018-02-03 (×10): 100 mg via ORAL
  Filled 2018-01-29 (×9): qty 1

## 2018-01-29 MED ORDER — IRBESARTAN 150 MG PO TABS
300.0000 mg | ORAL_TABLET | Freq: Every day | ORAL | Status: DC
Start: 2018-01-30 — End: 2018-02-03
  Administered 2018-01-30 – 2018-02-03 (×5): 300 mg via ORAL
  Filled 2018-01-29 (×4): qty 2

## 2018-01-29 MED ORDER — DEXAMETHASONE SODIUM PHOSPHATE 10 MG/ML IJ SOLN
10.0000 mg | Freq: Once | INTRAMUSCULAR | Status: AC
  Administered 2018-01-29: 10 mg via INTRAVENOUS

## 2018-01-29 MED ORDER — METOCLOPRAMIDE HCL 5 MG/ML IJ SOLN: 5.0000 mg | Freq: Three times a day (TID) | INTRAMUSCULAR | Status: DC | PRN

## 2018-01-29 MED ORDER — CEFAZOLIN SODIUM-DEXTROSE 2-4 GM/100ML-% IV SOLN
2.0000 g | Freq: Four times a day (QID) | INTRAVENOUS | Status: AC
  Administered 2018-01-29 (×2): 2 g via INTRAVENOUS
  Filled 2018-01-29 (×2): qty 100

## 2018-01-29 MED ORDER — GABAPENTIN 300 MG PO CAPS
300.0000 mg | ORAL_CAPSULE | Freq: Three times a day (TID) | ORAL | Status: DC
  Administered 2018-01-29 – 2018-02-03 (×14): 300 mg via ORAL
  Filled 2018-01-29 (×12): qty 1

## 2018-01-29 MED ORDER — FLEET ENEMA 7-19 GM/118ML RE ENEM: 1.0000 | ENEMA | Freq: Once | RECTAL | Status: DC | PRN

## 2018-01-29 MED ORDER — METHOCARBAMOL 500 MG PO TABS
500.0000 mg | ORAL_TABLET | Freq: Four times a day (QID) | ORAL | Status: DC | PRN
  Administered 2018-01-30 – 2018-02-02 (×5): 500 mg via ORAL
  Filled 2018-01-29 (×6): qty 1

## 2018-01-29 MED ORDER — PHENOL 1.4 % MT LIQD: 1.0000 | OROMUCOSAL | Status: DC | PRN

## 2018-01-29 MED ORDER — ACETAMINOPHEN 10 MG/ML IV SOLN
1000.0000 mg | Freq: Once | INTRAVENOUS | Status: AC
  Administered 2018-01-29: 1000 mg via INTRAVENOUS
  Filled 2018-01-29: qty 100

## 2018-01-29 SURGICAL SUPPLY — 55 items
BAG ZIPLOCK 12X15 (MISCELLANEOUS) ×6 IMPLANT
BANDAGE ACE 6X5 VEL STRL LF (GAUZE/BANDAGES/DRESSINGS) ×6 IMPLANT
BANDAGE ESMARK 6X9 LF (GAUZE/BANDAGES/DRESSINGS) ×1 IMPLANT
BLADE SAG 18X100X1.27 (BLADE) ×6 IMPLANT
BLADE SAW SGTL 11.0X1.19X90.0M (BLADE) ×6 IMPLANT
BLADE SURG SZ10 CARB STEEL (BLADE) ×6 IMPLANT
BNDG COHESIVE 6X5 TAN STRL LF (GAUZE/BANDAGES/DRESSINGS) ×3 IMPLANT
BNDG ESMARK 6X9 LF (GAUZE/BANDAGES/DRESSINGS) ×3
BOWL SMART MIX CTS (DISPOSABLE) ×6 IMPLANT
CAPT KNEE TOTAL 3 ATTUNE ×6 IMPLANT
CEMENT HV SMART SET (Cement) ×12 IMPLANT
CLOSURE WOUND 1/2 X4 (GAUZE/BANDAGES/DRESSINGS) ×4
COVER SURGICAL LIGHT HANDLE (MISCELLANEOUS) ×3 IMPLANT
CUFF TOURN SGL QUICK 34 (TOURNIQUET CUFF) ×4
CUFF TRNQT CYL 34X4X40X1 (TOURNIQUET CUFF) ×2 IMPLANT
DRAPE EXTREMITY BILATERAL (DRAPES) ×3 IMPLANT
DRAPE INCISE IOBAN 66X45 STRL (DRAPES) ×3 IMPLANT
DRAPE POUCH INSTRU U-SHP 10X18 (DRAPES) ×3 IMPLANT
DRAPE U-SHAPE 47X51 STRL (DRAPES) ×9 IMPLANT
DRSG ADAPTIC 3X8 NADH LF (GAUZE/BANDAGES/DRESSINGS) ×6 IMPLANT
DRSG PAD ABDOMINAL 8X10 ST (GAUZE/BANDAGES/DRESSINGS) ×6 IMPLANT
DURAPREP 26ML APPLICATOR (WOUND CARE) ×6 IMPLANT
ELECT REM PT RETURN 15FT ADLT (MISCELLANEOUS) ×3 IMPLANT
EVACUATOR 1/8 PVC DRAIN (DRAIN) ×6 IMPLANT
FACESHIELD WRAPAROUND (MASK) ×6 IMPLANT
GAUZE SPONGE 4X4 12PLY STRL (GAUZE/BANDAGES/DRESSINGS) ×6 IMPLANT
GLOVE BIO SURGEON STRL SZ7.5 (GLOVE) ×6 IMPLANT
GLOVE BIO SURGEON STRL SZ8 (GLOVE) ×6 IMPLANT
GLOVE BIOGEL PI IND STRL 8 (GLOVE) ×2 IMPLANT
GLOVE BIOGEL PI INDICATOR 8 (GLOVE) ×4
GOWN STRL REUS W/TWL LRG LVL3 (GOWN DISPOSABLE) ×6 IMPLANT
GOWN STRL REUS W/TWL XL LVL3 (GOWN DISPOSABLE) ×6 IMPLANT
HANDPIECE INTERPULSE COAX TIP (DISPOSABLE) ×2
IMMOBILIZER KNEE 20 (SOFTGOODS) ×6
IMMOBILIZER KNEE 20 THIGH 36 (SOFTGOODS) ×2 IMPLANT
MANIFOLD NEPTUNE II (INSTRUMENTS) ×3 IMPLANT
NS IRRIG 1000ML POUR BTL (IV SOLUTION) ×3 IMPLANT
PACK TOTAL KNEE CUSTOM (KITS) ×3 IMPLANT
PADDING CAST COTTON 6X4 STRL (CAST SUPPLIES) ×6 IMPLANT
SET HNDPC FAN SPRY TIP SCT (DISPOSABLE) ×1 IMPLANT
SPONGE LAP 18X18 X RAY DECT (DISPOSABLE) ×3 IMPLANT
STOCKINETTE 8 INCH (MISCELLANEOUS) ×3 IMPLANT
STRIP CLOSURE SKIN 1/2X4 (GAUZE/BANDAGES/DRESSINGS) ×8 IMPLANT
SUCTION FRAZIER HANDLE 12FR (TUBING) ×2
SUCTION TUBE FRAZIER 12FR DISP (TUBING) ×1 IMPLANT
SUT MNCRL AB 4-0 PS2 18 (SUTURE) ×6 IMPLANT
SUT STRATAFIX 0 PDS 27 VIOLET (SUTURE) ×3
SUT VIC AB 2-0 CT1 27 (SUTURE) ×12
SUT VIC AB 2-0 CT1 TAPERPNT 27 (SUTURE) ×6 IMPLANT
SUTURE STRATFX 0 PDS 27 VIOLET (SUTURE) ×1 IMPLANT
SYR 50ML LL SCALE MARK (SYRINGE) ×3 IMPLANT
TRAY FOLEY W/METER SILVER 16FR (SET/KITS/TRAYS/PACK) IMPLANT
WATER STERILE IRR 1000ML POUR (IV SOLUTION) ×6 IMPLANT
WRAP KNEE MAXI GEL POST OP (GAUZE/BANDAGES/DRESSINGS) ×6 IMPLANT
YANKAUER SUCT BULB TIP 10FT TU (MISCELLANEOUS) ×3 IMPLANT

## 2018-01-29 NOTE — Progress Notes (Signed)
ANTICOAGULATION CONSULT NOTE - Initial Consult  Pharmacy Consult for Warfarin to begin POD1, Epidural in place Indication: VTE Prophylaxis  Allergies  Allergen Reactions  . Bee Venom Anaphylaxis  . Epinephrine Other (See Comments) and Hypertension    Elevated BP/shaking/sickness, including epi substitutes   . Atorvastatin Other (See Comments)    Body pain, loss of sleep  . Other Other (See Comments)    Perfume - headache Cigarettes-fainting  . Hct [Hydrochlorothiazide] Anxiety  . Lisinopril Anxiety   Patient Measurements: Height: 5\' 3"  (160 cm) Weight: 173 lb (78.5 kg) IBW/kg (Calculated) : 52.4  Vital Signs: Temp: 97.8 F (36.6 C) (03/06 1300) Temp Source: Oral (03/06 0838) BP: 131/59 (03/06 1330) Pulse Rate: 53 (03/06 1330)  Labs: No results for input(s): HGB, HCT, PLT, APTT, LABPROT, INR, HEPARINUNFRC, HEPRLOWMOCWT, CREATININE, CKTOTAL, CKMB, TROPONINI in the last 72 hours.  Estimated Creatinine Clearance: 62.2 mL/min (by C-G formula based on SCr of 0.81 mg/dL).  Medications:  Scheduled:  . acetaminophen  1,000 mg Oral Q6H  . [START ON 01/30/2018] dexamethasone  10 mg Intravenous Once  . docusate sodium  100 mg Oral BID  . gabapentin  300 mg Oral TID  . [START ON 01/30/2018] irbesartan  300 mg Oral Daily  . [START ON 01/30/2018] metoprolol succinate  25 mg Oral Daily  . [START ON 01/30/2018] naloxegol oxalate  25 mg Oral Daily   Infusions:  . sodium chloride    .  ceFAZolin (ANCEF) IV    . methocarbamol (ROBAXIN)  IV    . ropivacaine (PF) 2 mg/mL (0.2%) 10 mL/hr (01/29/18 1316)   Assessment: 19 yoF s/p bilateral knee replacements. Epidural Ropivacaine to POD1.  Warfarin for VTE prophylaxis to begin POD1 at 0900, can be given before epidural catheter removed.  Goal of Therapy:  INR 2-3 Monitor platelets by anticoagulation protocol: Yes   Plan:   Daily INR ordered  First Warfarin tomorrow  Nyoka Cowden, Christiona Siddique L 01/29/2018,1:42 PM

## 2018-01-29 NOTE — Anesthesia Postprocedure Evaluation (Signed)
Anesthesia Post Note  Patient: Denise Fox  Procedure(s) Performed: TOTAL KNEE BILATERAL (Bilateral Knee)     Patient location during evaluation: PACU Anesthesia Type: Spinal and Epidural Level of consciousness: oriented and awake and alert Pain management: pain level controlled Vital Signs Assessment: post-procedure vital signs reviewed and stable Respiratory status: spontaneous breathing, respiratory function stable and patient connected to nasal cannula oxygen Cardiovascular status: blood pressure returned to baseline and stable Postop Assessment: no headache, no backache and no apparent nausea or vomiting Anesthetic complications: no    Last Vitals:  Vitals:   01/29/18 1345 01/29/18 1400  BP: 140/69 138/72  Pulse: (!) 50 (!) 49  Resp: 14 14  Temp:  (!) 36.4 C  SpO2: 100% 100%    Last Pain:  Vitals:   01/29/18 1316  TempSrc:   PainSc: 0-No pain                 Danyah Guastella S

## 2018-01-29 NOTE — Anesthesia Procedure Notes (Signed)
Epidural Patient location during procedure: holding area Start time: 01/29/2018 10:31 AM End time: 01/29/2018 10:45 AM  Staffing Anesthesiologist: Myrtie Soman, MD Performed: anesthesiologist   Preanesthetic Checklist Completed: patient identified, site marked, surgical consent, pre-op evaluation, timeout performed, IV checked, risks and benefits discussed and monitors and equipment checked  Epidural Patient position: sitting Prep: Betadine Patient monitoring: heart rate, continuous pulse ox and blood pressure Approach: midline Location: L3-L4 Injection technique: LOR air  Needle:  Needle type: Hustead  Needle gauge: 18 G Needle length: 9 cm and 9 Needle insertion depth: 6 cm Catheter type: closed end flexible Catheter size: 20 Guage Catheter at skin depth: 11 cm Test dose: negative and 1.5% lidocaine with Epi 1:200 K  Assessment Sensory level: T10  Additional Notes Test dose 1.5% Lidocaine with epi 1:200,000  Patient tolerated the insertion well without complications.Reason for block:post-op pain management

## 2018-01-29 NOTE — Interval H&P Note (Signed)
History and Physical Interval Note:  01/29/2018 9:26 AM  Denise Fox  has presented today for surgery, with the diagnosis of Osteoarthritis Bilateral Knee  The various methods of treatment have been discussed with the patient and family. After consideration of risks, benefits and other options for treatment, the patient has consented to  Procedure(s): TOTAL KNEE BILATERAL (Bilateral) as a surgical intervention .  The patient's history has been reviewed, patient examined, no change in status, stable for surgery.  I have reviewed the patient's chart and labs.  Questions were answered to the patient's satisfaction.     Pilar Plate Deandrew Hoecker

## 2018-01-29 NOTE — Anesthesia Preprocedure Evaluation (Signed)
Anesthesia Evaluation  Patient identified by MRN, date of birth, ID band Patient awake    Reviewed: Allergy & Precautions, NPO status , Patient's Chart, lab work & pertinent test results  Airway Mallampati: II  TM Distance: >3 FB Neck ROM: Full    Dental no notable dental hx.    Pulmonary neg pulmonary ROS,    Pulmonary exam normal breath sounds clear to auscultation       Cardiovascular hypertension, + CAD and + Past MI  Normal cardiovascular exam Rhythm:Regular Rate:Normal     Neuro/Psych negative neurological ROS  negative psych ROS   GI/Hepatic negative GI ROS, Neg liver ROS,   Endo/Other  negative endocrine ROS  Renal/GU negative Renal ROS  negative genitourinary   Musculoskeletal negative musculoskeletal ROS (+)   Abdominal   Peds negative pediatric ROS (+)  Hematology negative hematology ROS (+)   Anesthesia Other Findings   Reproductive/Obstetrics negative OB ROS                             Anesthesia Physical Anesthesia Plan  ASA: II  Anesthesia Plan: Combined Spinal and Epidural   Post-op Pain Management:    Induction: Intravenous  PONV Risk Score and Plan: 0  Airway Management Planned: Simple Face Mask  Additional Equipment:   Intra-op Plan:   Post-operative Plan:   Informed Consent: I have reviewed the patients History and Physical, chart, labs and discussed the procedure including the risks, benefits and alternatives for the proposed anesthesia with the patient or authorized representative who has indicated his/her understanding and acceptance.   Dental advisory given  Plan Discussed with: CRNA and Surgeon  Anesthesia Plan Comments:         Anesthesia Quick Evaluation

## 2018-01-29 NOTE — Anesthesia Procedure Notes (Signed)
Spinal  Patient location during procedure: OR Start time: 01/29/2018 10:31 AM End time: 01/29/2018 10:45 AM Staffing Anesthesiologist: Myrtie Soman, MD Performed: anesthesiologist  Preanesthetic Checklist Completed: patient identified, site marked, surgical consent, pre-op evaluation, timeout performed, IV checked, risks and benefits discussed and monitors and equipment checked Spinal Block Patient position: sitting Prep: Betadine Patient monitoring: heart rate, continuous pulse ox and blood pressure Location: L3-4 Injection technique: single-shot Needle Needle type: Sprotte  Needle gauge: 27 G Needle length: 10 cm Additional Notes Expiration date of kit checked and confirmed. Patient tolerated procedure well, without complications.  Combined CSE with 27g spinal needle used through 18G tuohy needle

## 2018-01-29 NOTE — Op Note (Signed)
Pre-operative diagnosis- Osteoarthritis  Bilateral knee(s)  Post-operative diagnosis- Osteoarthritis Bilateral knee(s)  Procedure-  Bilateral  Total Knee Arthroplasty  Surgeon- Dione Plover. Chaunce Winkels, MD  Assistant- Arlee Muslim, PA-C   Anesthesia-  Spinal and Epidural  EBL-75 mL   Drains Hemovac x 1 each side  Tourniquet time-  Total Tourniquet Time Documented: Thigh (Left) - 32 minutes Total: Thigh (Left) - 32 minutes  Thigh (Right) - 31 minutes Total: Thigh (Right) - 31 minutes     Complications- None  Condition-PACU - hemodynamically stable.   Brief Clinical Note  Denise Fox is a 72 y.o. year old female with end stage OA of both knees with progressively worsening pain and dysfunction. The patient has constant pain, with activity and at rest and significant functional deficits with difficulties even with ADLs. The patient has had extensive non-op management including analgesics, injections of cortisone and viscosupplements, and home exercise program, but remains in significant pain with significant dysfunction. We discussed replacing both knees in the same setting versus one at a time including procedure, risks, potential complications, rehab course, and pros and cons associated with each and the patient elects to do both knees at the same time. The patient presents now for bilateral Total Knee Arthroplasty.     Procedure in detail---   The patient is brought into the operating room and positioned supine on the operating table. After successful administration of  Spinal and Epidural,   a tourniquet is placed high on the  Bilateral thigh(s) and the lower extremities are prepped and draped in the usual sterile fashion. Time out is performed by the operating team and then the  Left lower extremity is wrapped in Esmarch, knee flexed and the tourniquet inflated to 300 mmHg.       A midline incision is made with a ten blade through the subcutaneous tissue to the level of the extensor  mechanism. A fresh blade is used to make a medial parapatellar arthrotomy. Soft tissue over the proximal medial tibia is subperiosteally elevated to the joint line with a knife and into the semimembranosus bursa with a Cobb elevator. Soft tissue over the proximal lateral tibia is elevated with attention being paid to avoiding the patellar tendon on the tibial tubercle. The patella is everted, knee flexed 90 degrees and the ACL and PCL are removed. Findings are bone on bone medial and patellofemoral with large global osteophytes.        The drill is used to create a starting hole in the distal femur and the canal is thoroughly irrigated with sterile saline to remove the fatty contents. The 5 degree Left  valgus alignment guide is placed into the femoral canal and the distal femoral cutting block is pinned to remove 9 mm off the distal femur. Resection is made with an oscillating saw.      The tibia is subluxed forward and the menisci are removed. The extramedullary alignment guide is placed referencing proximally at the medial aspect of the tibial tubercle and distally along the second metatarsal axis and tibial crest. The block is pinned to remove 31mm off the more deficient medial  side. Resection is made with an oscillating saw. Size 4is the most appropriate size for the tibia and the proximal tibia is prepared with the modular drill and keel punch for that size.      The femoral sizing guide is placed and size 5 is most appropriate. Rotation is marked off the epicondylar axis and confirmed by creating a rectangular flexion  gap at 90 degrees. The size 5 cutting block is pinned in this rotation and the anterior, posterior and chamfer cuts are made with the oscillating saw. The intercondylar block is then placed and that cut is made.      Trial size 4 tibial component, trial size 5 narrow posterior stabilized femur and a 8  mm posterior stabilized rotating platform insert trial is placed. Full extension is  achieved with excellent varus/valgus and anterior/posterior balance throughout full range of motion. The patella is everted and thickness measured to be 22  mm. Free hand resection is taken to 12 mm, a 35 template is placed, lug holes are drilled, trial patella is placed, and it tracks normally. Osteophytes are removed off the posterior femur with the trial in place. All trials are removed and the cut bone surfaces prepared with pulsatile lavage. Cement is mixed and once ready for implantation, the size 4 tibial implant, size  5 narrow posterior stabilized femoral component, and the size 35 patella are cemented in place and the patella is held with the clamp. The trial insert is placed and the knee held in full extension.  All extruded cement is removed and once the cement is hard the permanent 8 mm posterior stabilized rotating platform insert is placed into the tibial tray.      The wound is copiously irrigated with saline solution and the extensor mechanism closed over a hemovac drain with #1 V-loc suture. The tourniquet is released for a total tourniquet time of 30  minutes. Flexion against gravity is 140 degrees and the patella tracks normally. Subcutaneous tissue is closed with 2.0 vicryl and subcuticular with running 4.0 Monocryl.       The  Right lower extremity is wrapped in Esmarch, knee flexed and the tourniquet inflated to 300 mmHg.       A midline incision is made with a ten blade through the subcutaneous tissue to the level of the extensor mechanism. A fresh blade is used to make a medial parapatellar arthrotomy. Soft tissue over the proximal medial tibia is subperiosteally elevated to the joint line with a knife and into the semimembranosus bursa with a Cobb elevator. Soft tissue over the proximal lateral tibia is elevated with attention being paid to avoiding the patellar tendon on the tibial tubercle. The patella is everted, knee flexed 90 degrees and the ACL and PCL are removed. Findings are  bone on bone medial and patellofemoral with large global osteophytes.        The drill is used to create a starting hole in the distal femur and the canal is thoroughly irrigated with sterile saline to remove the fatty contents. The 5 degree Right  valgus alignment guide is placed into the femoral canal and the distal femoral cutting block is pinned to remove 9 mm off the distal femur. Resection is made with an oscillating saw.      The tibia is subluxed forward and the menisci are removed. The extramedullary alignment guide is placed referencing proximally at the medial aspect of the tibial tubercle and distally along the second metatarsal axis and tibial crest. The block is pinned to remove 40mm off the more deficient medial  side. Resection is made with an oscillating saw. Size 4is the most appropriate size for the tibia and the proximal tibia is prepared with the modular drill and keel punch for that size.      The femoral sizing guide is placed and size 5 is most appropriate. Rotation is  marked off the epicondylar axis and confirmed by creating a rectangular flexion gap at 90 degrees. The size 5 cutting block is pinned in this rotation and the anterior, posterior and chamfer cuts are made with the oscillating saw. The intercondylar block is then placed and that cut is made.      Trial size 4 tibial component, trial size 5 narrow posterior stabilized femur and a 10  mm posterior stabilized rotating platform insert trial is placed. Full extension is achieved with excellent varus/valgus and anterior/posterior balance throughout full range of motion. The patella is everted and thickness measured to be 22  mm. Free hand resection is taken to 12 mm, a 35 template is placed, lug holes are drilled, trial patella is placed, and it tracks normally. Osteophytes are removed off the posterior femur with the trial in place. All trials are removed and the cut bone surfaces prepared with pulsatile lavage. Cement is mixed  and once ready for implantation, the size 4 tibial implant, size  5 narrow posterior stabilized femoral component, and the size 35 patella are cemented in place and the patella is held with the clamp. The trial insert is placed and the knee held in full extension.   All extruded cement is removed and once the cement is hard the permanent 10 mm posterior stabilized rotating platform insert is placed into the tibial tray.      The wound is copiously irrigated with saline solution and the extensor mechanism closed over a hemovac drain with #1 V-loc suture. The tourniquet is released for a total tourniquet time of 31  minutes. Flexion against gravity is 140 degrees and the patella tracks normally. Subcutaneous tissue is closed with 2.0 vicryl and subcuticular with running 4.0 Monocryl. The incisions are cleaned and dried and steri-strips and  bulky sterile dressings are applied. The limbs are placed into knee immobilizers and the patient is awakened and transported to recovery in stable condition.            Please note that a surgical assistant was a medical necessity for this procedure in order to perform it in a safe and expeditious manner. Surgical assistant was necessary to retract the ligaments and vital neurovascular structures to prevent injury to them and also necessary for proper positioning of the limb to allow for anatomic placement of the prosthesis.   Dione Plover Lannie Heaps, MD    01/29/2018, 12:46 PM

## 2018-01-29 NOTE — Transfer of Care (Signed)
Immediate Anesthesia Transfer of Care Note  Patient: Sam Wunschel  Procedure(s) Performed: TOTAL KNEE BILATERAL (Bilateral Knee)  Patient Location: PACU  Anesthesia Type:Spinal  Level of Consciousness: alert   Airway & Oxygen Therapy: Patient Spontanous Breathing and Patient connected to face mask oxygen  Post-op Assessment: Report given to RN and Post -op Vital signs reviewed and stable  Post vital signs: Reviewed and stable  Last Vitals:  Vitals:   01/29/18 0838  BP: (!) 153/79  Pulse: (!) 57  Resp: 16  Temp: 36.7 C  SpO2: 99%    Last Pain:  Vitals:   01/29/18 0838  TempSrc: Oral      Patients Stated Pain Goal: 3 (79/43/27 6147)  Complications: No apparent anesthesia complications

## 2018-01-30 LAB — BASIC METABOLIC PANEL
Anion gap: 6 (ref 5–15)
BUN: 13 mg/dL (ref 6–20)
CHLORIDE: 110 mmol/L (ref 101–111)
CO2: 26 mmol/L (ref 22–32)
CREATININE: 0.69 mg/dL (ref 0.44–1.00)
Calcium: 9.1 mg/dL (ref 8.9–10.3)
GFR calc non Af Amer: >60 mL/min (ref >60)
Glucose, Bld: 135 mg/dL — ABNORMAL HIGH (ref 65–99)
Potassium: 4.1 mmol/L (ref 3.5–5.1)
Sodium: 142 mmol/L (ref 135–145)

## 2018-01-30 LAB — CBC
HEMATOCRIT: 34.1 % — AB (ref 36.0–46.0)
HEMOGLOBIN: 11.1 g/dL — AB (ref 12.0–15.0)
MCH: 28.9 pg (ref 26.0–34.0)
MCHC: 32.6 g/dL (ref 30.0–36.0)
MCV: 88.8 fL (ref 78.0–100.0)
Platelets: 209 10*3/uL (ref 150–400)
RBC: 3.84 MIL/uL — ABNORMAL LOW (ref 3.87–5.11)
RDW: 13.9 % (ref 11.5–15.5)
WBC: 13.9 10*3/uL — ABNORMAL HIGH (ref 4.0–10.5)

## 2018-01-30 LAB — PROTIME-INR
INR: 1.11
Prothrombin Time: 14.2 seconds (ref 11.4–15.2)

## 2018-01-30 MED ORDER — WARFARIN - PHARMACIST DOSING INPATIENT: Freq: Every day | Status: DC

## 2018-01-30 MED ORDER — HYDROMORPHONE HCL 2 MG PO TABS
2.0000 mg | ORAL_TABLET | ORAL | Status: DC | PRN
  Administered 2018-01-30 – 2018-01-31 (×4): 4 mg via ORAL
  Filled 2018-01-30 (×4): qty 2

## 2018-01-30 MED ORDER — SODIUM CHLORIDE 0.9 % IV BOLUS (SEPSIS)
250.0000 mL | Freq: Once | INTRAVENOUS | Status: AC
  Administered 2018-01-30: 250 mL via INTRAVENOUS

## 2018-01-30 MED ORDER — WARFARIN SODIUM 4 MG PO TABS
4.0000 mg | ORAL_TABLET | Freq: Once | ORAL | Status: AC
  Administered 2018-01-30: 4 mg via ORAL
  Filled 2018-01-30: qty 1

## 2018-01-30 NOTE — NC FL2 (Signed)
[place] LEVEL OF CARE SCREENING TOOL     IDENTIFICATION  Patient Name: Denise Fox Birthdate: 28-Jul-1946 Sex: female Admission Date (Current Location): 01/29/2018  [facility] and [place] Number:  Herbalist and Address:  [facility],  [place] [address] [place]      Provider Number: [id number]  Attending Physician Name and Address:  Gaynelle Arabian, MD  Relative Name and Phone Number:       Current Level of Care: Hospital Recommended Level of Care: [place] Prior Approval Number:    Date Approved/Denied:   PASRR Number: [id number]  Discharge Plan: SNF    Current Diagnoses: Patient Active Problem List   Diagnosis Date Noted  . OA (osteoarthritis) of knee 01/29/2018  . Endometrial cancer ([place]) 10/29/2017  . Endometrial mass 10/07/2017  . Chest discomfort 09/10/2016  . Abnormal EKG 09/10/2016  . Dizziness 09/09/2016  . Hyperlipidemia 09/08/2014  . Osteopenia 09/08/2014  . Essential hypertension, benign 08/11/2014    Orientation RESPIRATION BLADDER Height & Weight     Self, Time, Situation, Place  Normal Continent, Indwelling catheter Weight: 173 lb (78.5 kg) Height:  5\' 3"  (160 cm)  BEHAVIORAL SYMPTOMS/MOOD NEUROLOGICAL BOWEL NUTRITION STATUS      Continent Diet(regular diet)  AMBULATORY STATUS COMMUNICATION OF NEEDS Skin   Limited Assist Verbally Surgical wounds  closed incision, left and right leg, compression wrap                     Personal Care Assistance Level of Assistance  Bathing, Feeding, Dressing Bathing Assistance: Limited assistance Feeding assistance: Independent Dressing Assistance: Limited assistance     Functional Limitations Info  Sight, Hearing, Speech Sight Info: Adequate Hearing Info: Adequate Speech Info: Adequate    SPECIAL CARE FACTORS FREQUENCY  PT (By licensed PT), OT (By licensed OT)     PT Frequency: 5x OT Frequency: 5x            Contractures  Contractures Info: Not present    Additional Factors Info  Code Status, Allergies Code Status Info: full code Allergies Info: Bee Venom, Epinephrine, Atorvastatin, Other, Hct Hydrochlorothiazide, Lisinopril           Current Medications (01/30/2018):  This is the current hospital active medication list Current Facility-Administered Medications  Medication Dose Route Frequency Provider Last Rate Last Dose  . 0.9 %  sodium chloride infusion   Intravenous Continuous Gaynelle Arabian, MD 100 mL/hr at 01/30/18 1352    . bisacodyl (DULCOLAX) suppository 10 mg  10 mg Rectal Daily PRN Gaynelle Arabian, MD      . diphenhydrAMINE (BENADRYL) 12.5 MG/5ML elixir 12.5-25 mg  12.5-25 mg Oral Q4H PRN Aluisio, Pilar Plate, MD      . docusate sodium (COLACE) capsule 100 mg  100 mg Oral BID Gaynelle Arabian, MD   100 mg at 01/30/18 1022  . gabapentin (NEURONTIN) capsule 300 mg  300 mg Oral TID Gaynelle Arabian, MD   300 mg at 01/30/18 1021  . HYDROmorphone (DILAUDID) injection 0.5-1 mg  0.5-1 mg Intravenous Q4H PRN Aluisio, Pilar Plate, MD      . irbesartan (AVAPRO) tablet 300 mg  300 mg Oral Daily Gaynelle Arabian, MD   300 mg at 01/30/18 1022  . menthol-cetylpyridinium (CEPACOL) lozenge 3 mg  1 lozenge Oral PRN Aluisio, Pilar Plate, MD       Or  . phenol (CHLORASEPTIC) mouth spray 1 spray  1 spray Mouth/Throat PRN Gaynelle Arabian, MD      .  methocarbamol (ROBAXIN) tablet 500 mg  500 mg Oral Q6H PRN Gaynelle Arabian, MD       Or  . methocarbamol (ROBAXIN) 500 mg in dextrose 5 % 50 mL IVPB  500 mg Intravenous Q6H PRN Aluisio, Pilar Plate, MD      . metoCLOPramide (REGLAN) tablet 5-10 mg  5-10 mg Oral Q8H PRN Gaynelle Arabian, MD       Or  . metoCLOPramide (REGLAN) injection 5-10 mg  5-10 mg Intravenous Q8H PRN Aluisio, Pilar Plate, MD      . metoprolol succinate (TOPROL-XL) 24 hr tablet 25 mg  25 mg Oral Daily Aluisio, Pilar Plate, MD   25 mg at 01/30/18 1022  . naloxegol oxalate (MOVANTIK) tablet 25 mg  25 mg Oral Daily Gaynelle Arabian, MD   25 mg at  01/30/18 1021  . ondansetron (ZOFRAN) tablet 4 mg  4 mg Oral Q6H PRN Gaynelle Arabian, MD       Or  . ondansetron (ZOFRAN) injection 4 mg  4 mg Intravenous Q6H PRN Aluisio, Pilar Plate, MD      . oxyCODONE (Oxy IR/ROXICODONE) immediate release tablet 10-15 mg  10-15 mg Oral Q4H PRN Aluisio, Pilar Plate, MD      . oxyCODONE (Oxy IR/ROXICODONE) immediate release tablet 5-10 mg  5-10 mg Oral Q4H PRN Gaynelle Arabian, MD   10 mg at 01/30/18 1122  . polyethylene glycol (MIRALAX / GLYCOLAX) packet 17 g  17 g Oral Daily PRN Aluisio, Pilar Plate, MD      . ropivacaine (PF) 2 mg/mL (0.2%) (NAROPIN) injection  10 mL/hr Epidural Continuous Myrtie Soman, MD 10 mL/hr at 01/30/18 0425 10 mL/hr at 01/30/18 0425  . sodium phosphate (FLEET) 7-19 GM/118ML enema 1 enema  1 enema Rectal Once PRN Gaynelle Arabian, MD      . Derrill Memo ON 01/31/2018] Warfarin - Pharmacist Dosing Inpatient   Does not apply q1800 Minda Ditto, [facility]         Discharge Medications: Please see discharge summary for a list of discharge medications.  Relevant Imaging Results:  Relevant Lab Results:   Additional Information SS# [id number]  Nila Nephew, LCSW

## 2018-01-30 NOTE — Evaluation (Signed)
Physical Therapy Evaluation Patient Details Name: Denise Fox MRN: [id number] DOB: 05/10/1946 Today's Date: 01/30/2018   History of Present Illness  s/p bil TKAs  Clinical Impression  Pt is s/p TKA resulting in the deficits listed below (see PT Problem List).  Pt able to amb 48' with RW and +2 min assist. decr RLE control d/t epidural however able to manage with KI; Pt will benefit from skilled PT to increase their independence and safety with mobility to allow discharge to the venue listed below.  Pt should progress well; plans to go to [facility] for post acute rehab    Follow Up Recommendations SNF    Equipment Recommendations  None recommended by PT    Recommendations for Other Services       Precautions / Restrictions Precautions Precautions: Fall;Knee Required Braces or Orthoses: Knee Immobilizer - Right;Knee Immobilizer - Left Knee Immobilizer - Right: Discontinue once straight leg raise with < 10 degree lag Knee Immobilizer - Left: Discontinue once straight leg raise with < 10 degree lag Restrictions Weight Bearing Restrictions: No      Mobility  Bed Mobility Overal bed mobility: Needs Assistance Bed Mobility: Supine to Sit     Supine to sit: Min assist;+2 for safety/equipment;+2 for physical assistance     General bed mobility comments: cues for sequence and technique, assist with LEs off bed and trunk to upright  Transfers Overall transfer level: Needs assistance Equipment used: Rolling walker (2 wheeled) Transfers: Sit to/from Stand Sit to Stand: Min assist;+2 safety/equipment;From elevated surface;Mod assist         General transfer comment: cues for technique, bed ht elevated d/t bil KIs  Ambulation/Gait Ambulation/Gait assistance: Min assist;+2 safety/equipment Ambulation Distance (Feet): 48 Feet Assistive device: Rolling walker (2 wheeled) Gait Pattern/deviations: Step-to pattern;Decreased step length - right;Decreased step length - left      General Gait Details: cues for posture, RW safety, step length  Stairs            Wheelchair Mobility    Modified Rankin (Stroke Patients Only)       Balance Overall balance assessment: Needs assistance Sitting-balance support: No upper extremity supported;Feet supported Sitting balance-Leahy Scale: Fair       Standing balance-Leahy Scale: Poor Standing balance comment: reliant on UEs for static stand                             Pertinent Vitals/Pain Pain Assessment: 0-10 Pain Score: 3  Pain Location: right knee>left knee Pain Descriptors / Indicators: Discomfort;Sore Pain Intervention(s): Limited activity within patient's tolerance;Monitored during session;Repositioned;Ice applied;Other (comment)(epidural)    Home Living Family/patient expects to be discharged to:: Skilled nursing facility Living Arrangements: Alone                    Prior Function Level of Independence: Independent               Hand Dominance        Extremity/Trunk Assessment   Upper Extremity Assessment Upper Extremity Assessment: Overall WFL for tasks assessed    Lower Extremity Assessment Lower Extremity Assessment: RLE deficits/detail;LLE deficits/detail RLE Deficits / Details: ankle WFL, knee extension 2+/5, hip flexion 2+/5; knee AAROM flexion 10* to 55* LLE Deficits / Details: ankle WFL, knee extension 2+/5, hip flexion 2+/5; knee AAROM flexion 10* to 55*       Communication   Communication: No difficulties  Cognition Arousal/Alertness: Awake/alert Behavior During Therapy: [facility] for  tasks assessed/performed Overall Cognitive Status: Within Functional Limits for tasks assessed                                        General Comments      Exercises     Assessment/Plan    PT Assessment Patient needs continued PT services  PT Problem List Decreased strength;Decreased mobility;Pain;Decreased knowledge of precautions;Decreased  activity tolerance;Decreased range of motion       PT Treatment Interventions DME instruction;Gait training;Functional mobility training;Therapeutic activities;Therapeutic exercise;Patient/family education    PT Goals (Current goals can be found in the Care Plan section)  Acute Rehab PT Goals Patient Stated Goal: walk without pain PT Goal Formulation: With patient Time For Goal Achievement: 02/06/18 Potential to Achieve Goals: Good    Frequency 7X/week   Barriers to discharge        Co-evaluation               AM-PAC PT "6 Clicks" Daily Activity  Outcome Measure Difficulty turning over in bed (including adjusting bedclothes, sheets and blankets)?: Unable Difficulty moving from lying on back to sitting on the side of the bed? : Unable Difficulty sitting down on and standing up from a chair with arms (e.g., wheelchair, bedside commode, etc,.)?: Unable Help needed moving to and from a bed to chair (including a wheelchair)?: Total Help needed walking in hospital room?: Total   6 Click Score: 5    End of Session Equipment Utilized During Treatment: Gait belt;Right knee immobilizer;Left knee immobilizer Activity Tolerance: Patient tolerated treatment well Patient left: with call bell/phone within reach;in chair;with chair alarm set Nurse Communication: Mobility status PT Visit Diagnosis: Difficulty in walking, not elsewhere classified (R26.2)    Time: 5643-3295 PT Time Calculation (min) (ACUTE ONLY): 27 min   Charges:   PT Evaluation $PT Eval Low Complexity: 1 Low PT Treatments $Gait Training: 8-22 mins   PT G CodesKenyon Ana, PT Pager: [phone] 01/30/2018   Kenyon Ana 01/30/2018, 1:06 PM

## 2018-01-30 NOTE — Progress Notes (Signed)
01/30/18 1500  PT Visit Information  Last PT Received On 01/30/18  Assistance Needed +2  History of Present Illness s/p bil TKAs  Subjective Data  Patient Stated Goal walk without pain  Precautions  Precautions Fall;Knee  Required Braces or Orthoses Knee Immobilizer - Right;Knee Immobilizer - Left  Knee Immobilizer - Right Discontinue once straight leg raise with < 10 degree lag  Knee Immobilizer - Left Discontinue once straight leg raise with < 10 degree lag  Restrictions  Weight Bearing Restrictions No  Other Position/Activity Restrictions WBAT  Pain Assessment  Pain Assessment 0-10  Pain Score 4  Pain Location right knee>left knee  Pain Descriptors / Indicators Discomfort;Sore  Pain Intervention(s) Limited activity within patient's tolerance;Monitored during session;Premedicated before session;Ice applied;Repositioned;Other (comment) (epidural)  Cognition  Arousal/Alertness Awake/alert  Behavior During Therapy WFL for tasks assessed/performed  Overall Cognitive Status Within Functional Limits for tasks assessed  Bed Mobility  Overal bed mobility Needs Assistance  Bed Mobility Sit to Supine  Sit to supine Min assist;Mod assist  General bed mobility comments assist with LEs, positioning, cues for technique  Transfers  Overall transfer level Needs assistance  Equipment used Rolling walker (2 wheeled)  Transfers Sit to/from Stand  Sit to Stand Min assist;+2 safety/equipment;+2 physical assistance  General transfer comment cues for hand placement  Ambulation/Gait  Ambulation/Gait assistance Min assist;+2 safety/equipment  Ambulation Distance (Feet) 22 Feet  Assistive device Rolling walker (2 wheeled)  Gait Pattern/deviations Step-to pattern;Decreased step length - right;Decreased step length - left;Wide base of support  General Gait Details cues for posture, RW safety, step length; assist to advance LLE and support for foot flat, ankle control on L  Total Joint Exercises   Ankle Circles/Pumps AROM;Both;10 reps  Quad Sets AROM;Both;10 reps  Short Arc Quad AROM;Right;10 reps  Heel Slides AAROM;Both;10 reps  Hip ABduction/ADduction AAROM;Both;10 reps  Straight Leg Raises AAROM;AROM;Both;10 reps  Goniometric ROM right  10 to 55* AAROM, left knee 5* to 60*  PT - End of Session  Equipment Utilized During Treatment Gait belt;Right knee immobilizer;Left knee immobilizer  Activity Tolerance Patient tolerated treatment well  Patient left in bed;with call bell/phone within reach;with bed alarm set  Nurse Communication Mobility status  PT - Assessment/Plan  PT Plan Current plan remains appropriate  PT Visit Diagnosis Difficulty in walking, not elsewhere classified (R26.2)  PT Frequency (ACUTE ONLY) 7X/week  Follow Up Recommendations SNF  PT equipment None recommended by PT  AM-PAC PT "6 Clicks" Daily Activity Outcome Measure  Difficulty turning over in bed (including adjusting bedclothes, sheets and blankets)? 1  Difficulty moving from lying on back to sitting on the side of the bed?  1  Difficulty sitting down on and standing up from a chair with arms (e.g., wheelchair, bedside commode, etc,.)? 1  Help needed moving to and from a bed to chair (including a wheelchair)? 1  Help needed walking in hospital room? 2  Help needed climbing 3-5 steps with a railing?  1  6 Click Score 7  Mobility G Code  CM  PT Goal Progression  Progress towards PT goals Progressing toward goals  Acute Rehab PT Goals  PT Goal Formulation With patient  Time For Goal Achievement 02/06/18  Potential to Achieve Goals Good  PT Time Calculation  PT Start Time (ACUTE ONLY) 1414  PT Stop Time (ACUTE ONLY) 1452  PT Time Calculation (min) (ACUTE ONLY) 38 min  PT General Charges  $$ ACUTE PT VISIT 1 Visit  PT Treatments  $Gait Training  8-22 mins  $Therapeutic Exercise 23-37 mins

## 2018-01-30 NOTE — Addendum Note (Signed)
Addendum  created 01/30/18 0914 by Roderic Palau, MD   Sign clinical note

## 2018-01-30 NOTE — Anesthesia Post-op Follow-up Note (Signed)
  Anesthesia Pain Follow-up Note  Patient: Anjali Manzella  Day #: 1  Date of Follow-up: 01/30/2018 Time: 9:12 AM  Last Vitals:  Vitals:   01/30/18 0425 01/30/18 0450  BP:  (!) 98/59  Pulse: (!) 54 (!) 52  Resp: 16 16  Temp:  36.6 C  SpO2: 98% 99%    Level of Consciousness: alert  Pain: 1 /10   Side Effects:None  Catheter Site Exam:clean, dry, no drainage  Anti-Coag Meds (From admission, onward)   Start     Dose/Rate Route Frequency Ordered Stop   01/31/18 1800  Warfarin - Pharmacist Dosing Inpatient      Does not apply Daily-1800 01/30/18 0824     01/30/18 0900  warfarin (COUMADIN) tablet 4 mg     4 mg Oral  Once 01/30/18 0824      Epidural / Intrathecal (From admission, onward)   Start     Dose/Rate Route Frequency Ordered Stop   01/29/18 1245  ropivacaine (PF) 2 mg/mL (0.2%) (NAROPIN) injection     10 mL/hr 10 mL/hr  Epidural Continuous 01/29/18 1228 01/31/18 1244       Plan: Continue current therapy of postop epidural at surgeon's request. Pt to receive coumadin 4mg  today. Will repeat coags tomorrow with the plan to d/c epidural cath in AM.  Shantasia Hunnell,W. EDMOND

## 2018-01-30 NOTE — Progress Notes (Signed)
Subjective: 1 Day Post-Op Procedure(s) (LRB): TOTAL KNEE BILATERAL (Bilateral) Patient reports pain as very minimal Patient seen in rounds by Dr. Wynelle Link.  She is doing good and has excellent control by the epidural Patient is well, and has had no acute complaints or problems We will start therapy today.  Plan is to go Rehab after hospital stay.  Objective: Vital signs in last 24 hours: Temp:  [97.3 F (36.3 C)-98.4 F (36.9 C)] 98.4 F (36.9 C) (03/07 0919) Pulse Rate:  [49-66] 66 (03/07 1022) Resp:  [13-17] 15 (03/07 0919) BP: (97-140)/(50-72) 129/66 (03/07 1022) SpO2:  [97 %-100 %] 98 % (03/07 0919)  Intake/Output from previous day:  Intake/Output Summary (Last 24 hours) at 01/30/2018 1236 Last data filed at 01/30/2018 0920 Gross per 24 hour  Intake 3595.66 ml  Output 4155 ml  Net -559.34 ml    Intake/Output this shift: Total I/O In: 240 [P.O.:240] Out: 750 [Urine:750]  Labs: Recent Labs    01/30/18 0547  HGB 11.1*   Recent Labs    01/30/18 0547  WBC 13.9*  RBC 3.84*  HCT 34.1*  PLT 209   Recent Labs    01/30/18 0547  NA 142  K 4.1  CL 110  CO2 26  BUN 13  CREATININE 0.69  GLUCOSE 135*  CALCIUM 9.1   Recent Labs    01/30/18 0547  INR 1.11    EXAM General - Patient is Alert, Appropriate and Oriented Extremity - Neurovascular intact Sensation intact distally Intact pulses distally Dorsiflexion/Plantar flexion intact Dressing - dressing C/D/I Motor Function - intact, moving feet and toes well on exam.  Both Hemovacs pulled without difficulty.  Past Medical History:  Diagnosis Date  . Arthritis   . Complication of anesthesia    Increased heart rate, and stops breathing in her sleep the 1st 24hrs after the procedure  . Complication of anesthesia    Sleep Apnea  . Hyperlipidemia   . Hypertension   . Myocardial infarction ([place]) 2001   no stents placed  . Prolapsing mitral valve   . Right bundle branch block   . Scoliosis      Assessment/Plan: 1 Day Post-Op Procedure(s) (LRB): TOTAL KNEE BILATERAL (Bilateral) Principal Problem:   OA (osteoarthritis) of knee  Estimated body mass index is 30.65 kg/m as calculated from the following:   Height as of this encounter: 5\' 3"  (1.6 m).   Weight as of this encounter: 78.5 kg (173 lb). Advance diet Up with therapy Continue foley for now.  Will keep foley until tomorrow and will not be removed until at least 6-8 hours following the removal of the epidural catheter.  DVT Prophylaxis - Lovenox and Coumadin, Lovenox will not start until tomorrow afternoon following removal of the epidural. First dose of Coumadin today Weight-Bearing as tolerated to both leg  Continue O2 and Pulse OX   Take Coumadin for four weeks and then discontinue.  The dose may need to be adjusted based upon the INR.  Please follow the INR and titrate Coumadin dose for a therapeutic range between 2.0 and 3.0 INR.  After completing the four weeks of Coumadin, the patient may stop the Coumadin and resume their 81 mg Aspirin daily.  Lovenox injections will start tomorrow evening after the epidural has been removed and continue until the INR is therapeutic at or greater than 2.0.  When INR reaches the therapeutic level of equal to or greater than 2.0, the patient may discontinue the Lovenox injections.  Arlee Muslim,  PA-C Orthopaedic Surgery 01/30/2018, 12:36 PM

## 2018-01-30 NOTE — Progress Notes (Signed)
ANTICOAGULATION CONSULT NOTE - Initial Consult  Pharmacy Consult for Warfarin to begin POD1, Epidural in place Indication: VTE Prophylaxis  Allergies  Allergen Reactions  . Bee Venom Anaphylaxis  . Epinephrine Other (See Comments) and Hypertension    Elevated BP/shaking/sickness, including epi substitutes   . Atorvastatin Other (See Comments)    Body pain, loss of sleep  . Other Other (See Comments)    Perfume - headache Cigarettes-fainting  . Hct [Hydrochlorothiazide] Anxiety  . Lisinopril Anxiety   Patient Measurements: Height: 5\' 3"  (160 cm) Weight: 173 lb (78.5 kg) IBW/kg (Calculated) : 52.4  Vital Signs: Temp: 97.8 F (36.6 C) (03/07 0450) Temp Source: Oral (03/07 0450) BP: 98/59 (03/07 0450) Pulse Rate: 52 (03/07 0450)  Labs: Recent Labs    01/30/18 0547  HGB 11.1*  HCT 34.1*  PLT 209  LABPROT 14.2  INR 1.11  CREATININE 0.69    Estimated Creatinine Clearance: 63 mL/min (by C-G formula based on SCr of 0.69 mg/dL).  Medications:  Scheduled:  . acetaminophen  1,000 mg Oral Q6H  . dexamethasone  10 mg Intravenous Once  . docusate sodium  100 mg Oral BID  . gabapentin  300 mg Oral TID  . irbesartan  300 mg Oral Daily  . metoprolol succinate  25 mg Oral Daily  . naloxegol oxalate  25 mg Oral Daily   Infusions:  . sodium chloride 100 mL/hr at 01/30/18 0421  . methocarbamol (ROBAXIN)  IV    . ropivacaine (PF) 2 mg/mL (0.2%) 10 mL/hr (01/30/18 0425)   Assessment: 29 yoF s/p bilateral knee replacements. Epidural Ropivacaine to POD1.  Warfarin for VTE prophylaxis to begin POD1 at 0900, can be given before epidural catheter removed  Today, 01/30/2018  Baseline INR 1.11, on no anticoagulation PTA  Begin Warfarin POD 1 at 0900, plan to remove epidural catheter in am 3/8, then begin Lovenox in pm 3/8  Goal of Therapy:  INR 2-3 Monitor platelets by anticoagulation protocol: Yes   Plan:   Warfarin 4mg  this am 0900  Daily INR ordered  Minda Ditto  PharmD Pager [phone] 01/30/2018, 7:14 AM

## 2018-01-30 NOTE — Clinical Social Work Note (Signed)
Clinical Social Work Assessment  Patient Details  Name: Denise Fox MRN: [id number] Date of Birth: Apr 12, 1946  Date of referral:  01/30/18               Reason for consult:  Discharge Planning                Permission sought to share information with:  Family Supports Permission granted to share information::  Yes, Verbal Permission Granted  Name::        Agency::  [facility]  Relationship::     Contact Information:     Housing/Transportation Living arrangements for the past 2 months:  Single Family Home Source of Information:  Patient Patient Interpreter Needed:  None Criminal Activity/Legal Involvement Pertinent to Current Situation/Hospitalization:  No - Comment as needed Significant Relationships:  Friend Lives with:  Self Do you feel safe going back to the place where you live?  Yes Need for family participation in patient care:  Yes (Comment)  Care giving concerns:   Total Knee Bilateral    Social Worker assessment / plan:  CSW met with patient at bedside, explained role and reason for visit- to assist with discharge plan. Patient alert, oriented and receptive to CSW. Patient prearranged to go to [facility] before surgery. CSW explain to patient that [facility] at SNF had already contacted CSW about d/c plan.  Patient understands she will need insurance authorization before discharge.  Patient reports she has supportive friends that will help during the transition from hospital to SNF and back home.   Plan: SNF  Employment status:  Retired Forensic scientist:    PT Recommendations:  [place] / Referral to community resources:  [place]  Patient/Family's Response to care:  Agreeable and Responding well to care.   Patient/Family's Understanding of and Emotional Response to Diagnosis, Current Treatment, and Prognosis:  Patient very knowledgeable about diagnosis and follow up care. She was proactive in finding a SNF to  complete short rehab before returning home.   Emotional Assessment Appearance:  Appears stated age Attitude/Demeanor/Rapport:    Affect (typically observed):  Accepting, Pleasant Orientation:  Oriented to Self, Oriented to Place, Oriented to  Time, Oriented to Situation Alcohol / Substance use:  Not Applicable Psych involvement (Current and /or in the community):  No (Comment)  Discharge Needs  Concerns to be addressed:  Discharge Planning Concerns Readmission within the last 30 days:  No Current discharge risk:  None Barriers to Discharge:  Continued Medical Work up   [employer], [place] 01/30/2018, 4:19 PM

## 2018-01-31 LAB — CBC
HCT: 32.1 % — ABNORMAL LOW (ref 36.0–46.0)
HEMOGLOBIN: 10.3 g/dL — AB (ref 12.0–15.0)
MCH: 29.3 pg (ref 26.0–34.0)
MCHC: 32.1 g/dL (ref 30.0–36.0)
MCV: 91.5 fL (ref 78.0–100.0)
Platelets: 214 10*3/uL (ref 150–400)
RBC: 3.51 MIL/uL — AB (ref 3.87–5.11)
RDW: 14.6 % (ref 11.5–15.5)
WBC: 14.7 10*3/uL — ABNORMAL HIGH (ref 4.0–10.5)

## 2018-01-31 LAB — BASIC METABOLIC PANEL
Anion gap: 5 (ref 5–15)
BUN: 11 mg/dL (ref 6–20)
CHLORIDE: 108 mmol/L (ref 101–111)
CO2: 27 mmol/L (ref 22–32)
CREATININE: 0.59 mg/dL (ref 0.44–1.00)
Calcium: 8.8 mg/dL — ABNORMAL LOW (ref 8.9–10.3)
GFR calc Af Amer: >60 mL/min (ref >60)
GFR calc non Af Amer: >60 mL/min (ref >60)
GLUCOSE: 118 mg/dL — AB (ref 65–99)
POTASSIUM: 3.8 mmol/L (ref 3.5–5.1)
SODIUM: 140 mmol/L (ref 135–145)

## 2018-01-31 LAB — PROTIME-INR
INR: 1.61
INR: 1.62
PROTHROMBIN TIME: 19 s — AB (ref 11.4–15.2)
Prothrombin Time: 19.1 seconds — ABNORMAL HIGH (ref 11.4–15.2)

## 2018-01-31 MED ORDER — FLEET ENEMA 7-19 GM/118ML RE ENEM: 1.0000 | ENEMA | Freq: Once | RECTAL | 0 refills | Status: DC | PRN

## 2018-01-31 MED ORDER — RIVAROXABAN 10 MG PO TABS: 10.0000 mg | ORAL_TABLET | Freq: Every day | ORAL | Status: DC

## 2018-01-31 MED ORDER — RIVAROXABAN 10 MG PO TABS: 10.0000 mg | ORAL_TABLET | Freq: Every day | ORAL | 0 refills | Status: DC

## 2018-01-31 MED ORDER — METHOCARBAMOL 500 MG PO TABS: 500.0000 mg | ORAL_TABLET | Freq: Four times a day (QID) | ORAL | 0 refills | Status: DC | PRN

## 2018-01-31 MED ORDER — GABAPENTIN 300 MG PO CAPS: 300.0000 mg | ORAL_CAPSULE | Freq: Three times a day (TID) | ORAL | 0 refills | Status: DC

## 2018-01-31 MED ORDER — POLYETHYLENE GLYCOL 3350 17 G PO PACK: 17.0000 g | PACK | Freq: Every day | ORAL | 0 refills | Status: DC | PRN

## 2018-01-31 MED ORDER — ENOXAPARIN SODIUM 30 MG/0.3ML ~~LOC~~ SOLN: 30.0000 mg | Freq: Two times a day (BID) | SUBCUTANEOUS | 0 refills | Status: DC

## 2018-01-31 MED ORDER — HYDROMORPHONE HCL 2 MG PO TABS: 2.0000 mg | ORAL_TABLET | ORAL | 0 refills | Status: DC | PRN

## 2018-01-31 MED ORDER — BISACODYL 10 MG RE SUPP: 10.0000 mg | Freq: Every day | RECTAL | 0 refills | Status: DC | PRN

## 2018-01-31 MED ORDER — ONDANSETRON HCL 4 MG PO TABS
4.0000 mg | ORAL_TABLET | Freq: Four times a day (QID) | ORAL | 0 refills | Status: DC | PRN
Start: 2018-01-31 — End: 2018-02-03

## 2018-01-31 MED ORDER — DIPHENHYDRAMINE HCL 12.5 MG/5ML PO ELIX: 12.5000 mg | ORAL_SOLUTION | ORAL | 0 refills | Status: DC | PRN

## 2018-01-31 MED ORDER — DOCUSATE SODIUM 100 MG PO CAPS: 100.0000 mg | ORAL_CAPSULE | Freq: Two times a day (BID) | ORAL | 0 refills | Status: DC

## 2018-01-31 MED ORDER — NALOXEGOL OXALATE 25 MG PO TABS: 25.0000 mg | ORAL_TABLET | Freq: Every day | ORAL | 1 refills | Status: DC

## 2018-01-31 NOTE — Progress Notes (Signed)
Physical Therapy Treatment Patient Details Name: Denise Fox MRN: [id number] DOB: [birth year] Today's Date: 01/31/2018    History of Present Illness s/p bil TKAs    PT Comments    Pt progressing well; continue to recommend SNF, continue POC   Follow Up Recommendations  SNF     Equipment Recommendations  None recommended by PT    Recommendations for Other Services       Precautions / Restrictions Precautions Precautions: Fall;Knee Required Braces or Orthoses: Knee Immobilizer - Right;Knee Immobilizer - Left Knee Immobilizer - Right: Discontinue once straight leg raise with < 10 degree lag Knee Immobilizer - Left: Discontinue once straight leg raise with < 10 degree lag Restrictions Weight Bearing Restrictions: No Other Position/Activity Restrictions: WBAT    Mobility  Bed Mobility Overal bed mobility: Needs Assistance Bed Mobility: Sit to Supine       Sit to supine: Mod assist;Min assist;+2 for physical assistance;+2 for safety/equipment   General bed mobility comments: assist with LEs, positioning, cues for technique  Transfers Overall transfer level: Needs assistance Equipment used: Rolling walker (2 wheeled) Transfers: Sit to/from Stand Sit to Stand: Min assist;+2 safety/equipment;+2 physical assistance         General transfer comment: cues for hand placement and LE position  Ambulation/Gait Ambulation/Gait assistance: Min assist;+2 safety/equipment Ambulation Distance (Feet): 75 Feet Assistive device: Rolling walker (2 wheeled) Gait Pattern/deviations: Step-to pattern;Decreased step length - right;Decreased step length - left;Wide base of support     General Gait Details: cues for posture, RW safety, step length; bil KIs in place, improved LE control even though epidural still in place; heavy reliance on UEs   Stairs            Wheelchair Mobility    Modified Rankin (Stroke Patients Only)       Balance             Standing  balance-Leahy Scale: Poor Standing balance comment: reliant on UEs for static stand                            Cognition Arousal/Alertness: Awake/alert Behavior During Therapy: WFL for tasks assessed/performed Overall Cognitive Status: Within Functional Limits for tasks assessed                                        Exercises Total Joint Exercises Ankle Circles/Pumps: AROM;Both;10 reps Quad Sets: AROM;Both;10 reps Short Arc Quad: AROM;Right;10 reps Heel Slides: AAROM;Both;10 reps Hip ABduction/ADduction: AAROM;Both;10 reps Straight Leg Raises: AAROM;AROM;Both;10 reps    General Comments        Pertinent Vitals/Pain Pain Assessment: 0-10 Pain Score: 6  Pain Location: thigh Pain Descriptors / Indicators: Discomfort;Sore Pain Intervention(s): Limited activity within patient's tolerance;Monitored during session;Ice applied;Repositioned;Other (comment)(epidural)    Home Living                      Prior Function            PT Goals (current goals can now be found in the care plan section) Acute Rehab PT Goals Patient Stated Goal: walk without pain PT Goal Formulation: With patient Time For Goal Achievement: 02/06/18 Potential to Achieve Goals: Good Progress towards PT goals: Progressing toward goals    Frequency    7X/week      PT Plan Current plan remains appropriate    Co-evaluation  AM-PAC PT "6 Clicks" Daily Activity  Outcome Measure  Difficulty turning over in bed (including adjusting bedclothes, sheets and blankets)?: Unable Difficulty moving from lying on back to sitting on the side of the bed? : Unable Difficulty sitting down on and standing up from a chair with arms (e.g., wheelchair, bedside commode, etc,.)?: Unable Help needed moving to and from a bed to chair (including a wheelchair)?: A Lot Help needed walking in hospital room?: A Lot Help needed climbing 3-5 steps with a railing? : A  Lot 6 Click Score: 9    End of Session Equipment Utilized During Treatment: Gait belt;Right knee immobilizer;Left knee immobilizer Activity Tolerance: Patient tolerated treatment well Patient left: in bed;with call bell/phone within reach;with bed alarm set Nurse Communication: Mobility status PT Visit Diagnosis: Difficulty in walking, not elsewhere classified (R26.2)     Time: 6599-3570 PT Time Calculation (min) (ACUTE ONLY): 41 min  Charges:  $Gait Training: 8-22 mins $Therapeutic Exercise: 23-37 mins                    G CodesKenyon Ana, PT Pager: [phone] 01/31/2018    [facility] 01/31/2018, 3:47 PM

## 2018-01-31 NOTE — Discharge Summary (Signed)
Physician Discharge Summary   Patient ID: Denise Fox MRN: [id number] DOB/AGE: 73-Sep-1947 73 y.o.  Admit date: 01/29/2018 Discharge date: 02/03/2018  Primary Diagnosis:  Osteoarthritis Bilateral knee(s)  Admission Diagnoses:  Past Medical History:  Diagnosis Date  . Arthritis   . Complication of anesthesia    Increased heart rate, and stops breathing in her sleep the 1st 24hrs after the procedure  . Complication of anesthesia    Sleep Apnea  . Hyperlipidemia   . Hypertension   . Myocardial infarction ([place]) 2001   no stents placed  . Prolapsing mitral valve   . Right bundle branch block   . Scoliosis    Discharge Diagnoses:   Principal Problem:   OA (osteoarthritis) of knee  Estimated body mass index is 30.65 kg/m as calculated from the following:   Height as of this encounter: 5' 3"  (1.6 m).   Weight as of this encounter: 78.5 kg (173 lb).  Procedure:  Procedure(s) (LRB): TOTAL KNEE BILATERAL (Bilateral)   Consults: None  HPI: Denise Fox is a 72 y.o. year old female with end stage OA of both knees with progressively worsening pain and dysfunction. The patient has constant pain, with activity and at rest and significant functional deficits with difficulties even with ADLs. The patient has had extensive non-op management including analgesics, injections of cortisone and viscosupplements, and home exercise program, but remains in significant pain with significant dysfunction. We discussed replacing both knees in the same setting versus one at a time including procedure, risks, potential complications, rehab course, and pros and cons associated with each and the patient elects to do both knees at the same time. The patient presents now for bilateral Total Knee Arthroplasty.    Laboratory Data: Admission on 01/29/2018  Component Date Value Ref Range Status  . WBC 01/30/2018 13.9* 4.0 - 10.5 K/uL Final  . RBC 01/30/2018 3.84* 3.87 - 5.11 MIL/uL Final  . Hemoglobin 01/30/2018  11.1* 12.0 - 15.0 g/dL Final  . HCT 01/30/2018 34.1* 36.0 - 46.0 % Final  . MCV 01/30/2018 88.8  78.0 - 100.0 fL Final  . MCH 01/30/2018 28.9  26.0 - 34.0 pg Final  . MCHC 01/30/2018 32.6  30.0 - 36.0 g/dL Final  . RDW 01/30/2018 13.9  11.5 - 15.5 % Final  . Platelets 01/30/2018 209  150 - 400 K/uL Final   Performed at [facility], [place] [address] [place], [place] [address]  . Sodium 01/30/2018 142  135 - 145 mmol/L Final  . Potassium 01/30/2018 4.1  3.5 - 5.1 mmol/L Final  . Chloride 01/30/2018 110  101 - 111 mmol/L Final  . CO2 01/30/2018 26  22 - 32 mmol/L Final  . Glucose, Bld 01/30/2018 135* 65 - 99 mg/dL Final  . BUN 01/30/2018 13  6 - 20 mg/dL Final  . Creatinine, Ser 01/30/2018 0.69  0.44 - 1.00 mg/dL Final  . Calcium 01/30/2018 9.1  8.9 - 10.3 mg/dL Final  . GFR calc non Af Amer 01/30/2018 >60  >60 mL/min Final  . GFR calc Af Amer 01/30/2018 >60  >60 mL/min Final   Comment: (NOTE) The eGFR has been calculated using the CKD EPI equation. This calculation has not been validated in all clinical situations. eGFR's persistently <60 mL/min signify possible Chronic Kidney Disease.   Georgiann Hahn gap 01/30/2018 6  5 - 15 Final   Performed at [facility], [place] [address], [place],  [address]  . Prothrombin Time 01/30/2018 14.2  11.4 -  15.2 seconds Final  . INR 01/30/2018 1.11   Final   Performed at [facility] [address], [place], [place] [address]  . WBC 01/31/2018 14.7* 4.0 - 10.5 K/uL Final  . RBC 01/31/2018 3.51* 3.87 - 5.11 MIL/uL Final  . Hemoglobin 01/31/2018 10.3* 12.0 - 15.0 g/dL Final  . HCT 01/31/2018 32.1* 36.0 - 46.0 % Final  . MCV 01/31/2018 91.5  78.0 - 100.0 fL Final  . MCH 01/31/2018 29.3  26.0 - 34.0 pg Final  . MCHC 01/31/2018 32.1  30.0 - 36.0 g/dL Final  . RDW 01/31/2018 14.6  11.5 - 15.5 % Final  . Platelets 01/31/2018 214  150 - 400 K/uL Final   Performed at [facility],  [place] [address] [place], [place] [address]  . Sodium 01/31/2018 140  135 - 145 mmol/L Final  . Potassium 01/31/2018 3.8  3.5 - 5.1 mmol/L Final  . Chloride 01/31/2018 108  101 - 111 mmol/L Final  . CO2 01/31/2018 27  22 - 32 mmol/L Final  . Glucose, Bld 01/31/2018 118* 65 - 99 mg/dL Final  . BUN 01/31/2018 11  6 - 20 mg/dL Final  . Creatinine, Ser 01/31/2018 0.59  0.44 - 1.00 mg/dL Final  . Calcium 01/31/2018 8.8* 8.9 - 10.3 mg/dL Final  . GFR calc non Af Amer 01/31/2018 >60  >60 mL/min Final  . GFR calc Af Amer 01/31/2018 >60  >60 mL/min Final   Comment: (NOTE) The eGFR has been calculated using the CKD EPI equation. This calculation has not been validated in all clinical situations. eGFR's persistently <60 mL/min signify possible Chronic Kidney Disease.   Georgiann Hahn gap 01/31/2018 5  5 - 15 Final   Performed at [facility], [place] [address], [place], [place] [address]  . Prothrombin Time 01/31/2018 19.0* 11.4 - 15.2 seconds Final  . INR 01/31/2018 1.61   Final   Performed at [facility], [place] [address], [place], [place] [address]  . Prothrombin Time 01/31/2018 19.1* 11.4 - 15.2 seconds Final  . INR 01/31/2018 1.62   Final   Performed at [facility], [place] [address] [place], [place] [address]  . WBC 02/01/2018 11.2* 4.0 - 10.5 K/uL Final  . RBC 02/01/2018 3.33* 3.87 - 5.11 MIL/uL Final  . Hemoglobin 02/01/2018 9.9* 12.0 - 15.0 g/dL Final  . HCT 02/01/2018 30.1* 36.0 - 46.0 % Final  . MCV 02/01/2018 90.4  78.0 - 100.0 fL Final  . MCH 02/01/2018 29.7  26.0 - 34.0 pg Final  . MCHC 02/01/2018 32.9  30.0 - 36.0 g/dL Final  . RDW 02/01/2018 14.6  11.5 - 15.5 % Final  . Platelets 02/01/2018 206  150 - 400 K/uL Final   Performed at [facility], [place] [address] [place] [address] [place], [place] [address]  . Prothrombin Time 02/01/2018 15.8* 11.4 - 15.2 seconds Final  . INR 02/01/2018 1.27   Final   Performed at [facility], [place] [address], [place], [place] [address]  Hospital Outpatient Visit on 01/20/2018  Component Date Value Ref Range Status  . aPTT 01/20/2018 31  24 - 36 seconds Final   Performed at [facility], [place] [address] [place], [place] [address]  . WBC 01/20/2018 8.4  4.0 - 10.5 K/uL Final  . RBC 01/20/2018 4.93  3.87 - 5.11 MIL/uL Final  . Hemoglobin 01/20/2018 14.5  12.0 - 15.0 g/dL Final  . HCT 01/20/2018 44.3  36.0 - 46.0 % Final  . MCV 01/20/2018 89.9  78.0 - 100.0 fL Final  . MCH 01/20/2018 29.4  26.0 - 34.0 pg Final  . MCHC 01/20/2018 32.7  30.0 - 36.0 g/dL Final  . RDW 01/20/2018 14.2  11.5 - 15.5 % Final  . Platelets 01/20/2018 269  150 - 400 K/uL Final   Performed at [facility], [place] [address] [place], [place] [address]  . Sodium 01/20/2018 141  135 - 145 mmol/L Final  . Potassium 01/20/2018 4.6  3.5 - 5.1 mmol/L Final  . Chloride 01/20/2018 108  101 - 111 mmol/L Final  . CO2 01/20/2018 25  22 - 32 mmol/L Final  . Glucose, Bld 01/20/2018 104* 65 - 99 mg/dL Final  . BUN 01/20/2018 24* 6 - 20 mg/dL Final  . Creatinine, Ser 01/20/2018 0.81  0.44 - 1.00 mg/dL Final  . Calcium 01/20/2018 9.8  8.9 - 10.3 mg/dL Final  . Total Protein 01/20/2018 7.1  6.5 - 8.1 g/dL Final  . Albumin 01/20/2018 3.9  3.5 - 5.0 g/dL Final  . AST 01/20/2018 20  15 - 41 U/L Final  . ALT 01/20/2018 22  14 - 54 U/L Final  . Alkaline Phosphatase 01/20/2018 85  38 - 126 U/L Final  . Total Bilirubin 01/20/2018 0.5  0.3 - 1.2 mg/dL Final  . GFR calc non Af Amer 01/20/2018 >60  >60 mL/min Final  . GFR calc Af Amer 01/20/2018 >60  >60 mL/min Final   Comment: (NOTE) The eGFR has been calculated using the CKD EPI equation. This calculation has not been validated in all clinical situations. eGFR's persistently <60 mL/min signify possible Chronic Kidney Disease.   Georgiann Hahn gap 01/20/2018 8  5 - 15 Final   Performed at [facility], [place]  [address], [place], [place] [address]  . Prothrombin Time 01/20/2018 13.1  11.4 - 15.2 seconds Final  . INR 01/20/2018 1.00   Final   Performed at [facility], [place] [address], [place], [place] [address]  . ABO/RH(D) 01/20/2018 A POS   Final  . Antibody Screen 01/20/2018 NEG   Final  . Sample Expiration 01/20/2018 02/01/2018   Final  . Extend sample reason 01/20/2018    Final                   Value:NO TRANSFUSIONS OR PREGNANCY IN THE PAST 3 MONTHS Performed at [facility], [place] [address], [place], [place] [address]   . MRSA, PCR 01/20/2018 NEGATIVE  NEGATIVE Final  . Staphylococcus aureus 01/20/2018 POSITIVE* NEGATIVE Final   Comment: (NOTE) The Xpert SA Assay (FDA approved for NASAL specimens in patients 78 years of age and older), is one component of a comprehensive surveillance program. It is not intended to diagnose infection nor to guide or monitor treatment. Performed at [facility], [place] [address], [place], [place] [address]      X-Rays:No results found.  EKG: Orders placed or performed in visit on 10/08/17  . EKG 12-Lead     Hospital Course: Patient was admitted to [facility] and taken to the OR and underwent the above stated procedure well without complications.  Patient tolerated the procedure well and was later transferred to the recovery room and then to the orthopaedic floor for postoperative care. Anesthesia was consulted postoperatively to place an epidural in for postoperative pain management. The patient was also given PO and IV analgesics for pain control  following their surgery.  They were given 24 hours of postoperative antibiotics and started on DVT prophylaxis in the form of Coumadin on POD 1.    PT and OT were ordered for total joint protocol.  Discharge planning consulted to help with postop disposition and equipment needs.  Patient had a good night on the evening of surgery and started to get up  OOB with therapy on day one.  Hemovac drains were pulled without difficulty on day one.   POD 2 - Continued to work with therapy into day two.  Dressings were changed on day two and both incisions were healing well.  Her INR bumped too quickly and was 1.6 after one dose.  Epidural had to be left in place. POD 3 -The epidural was removed without difficulty by Anesthesia on day three after the INR came back down.  By day three, the patient started to show a little progress with therapy.   POD 4 - They continued to receive therapy each day for continued total knee protocol.  The incisions were healing well.  They continued to progress on day four. POD 5 - On day five, she was seen in rounds.  HGB was 9.9.  She was progressing with therapy. That morning she was given a suppository to help with constipation. She was slowing improving and was ready to go to [facility].  Started on Xarelto 10 mg to take for four weeks.  After completion of the Xarelto, then start a baby 81 mg Asprin daily for three additional weeks.   Diet - Cardiac diet Follow up - in 2 weeks from surgery (next week in the office) Activity - WBAT to both legs. Disposition - Skilled nursing facility - [facility] Condition Upon Discharge - Stable D/C Meds - See DC Summary DVT Prophylaxis - Xarelto for four weeks, then reduce to a baby 81 mg Aspirin daily for three additional weeks.    Discharge Instructions    Call MD / Call 911   Complete by:  As directed    If you experience chest pain or shortness of breath, CALL 911 and be transported to the hospital emergency room.  If you develope a fever above 101 F, pus (white drainage) or increased drainage or redness at the wound, or calf pain, call your surgeon's office.   Change dressing   Complete by:  As directed    Change dressing daily with sterile 4 x 4 inch gauze dressing and apply TED hose. Do not submerge the incision under water.   Constipation Prevention   Complete by:  As  directed    Drink plenty of fluids.  Prune juice may be helpful.  You may use a stool softener, such as Colace (over the counter) 100 mg twice a day.  Use MiraLax (over the counter) for constipation as needed.   Diet - low sodium heart healthy   Complete by:  As directed    Diet general   Complete by:  As directed    Discharge instructions   Complete by:  As directed     START Xarelto 10 daily on Monday 02/03/2018 and run for 4 weeks, then after completion of the Xarelto, start a baby 81 mg Aspirin daily at home for an additional 3 weeks.  Pick up stool softner and laxative for home use following surgery while on pain medications. Do not submerge incision under water. Please use good hand washing techniques while changing dressing each day. May shower starting three days after surgery.  Please use a clean towel to pat the incision dry following showers. Continue to use ice for pain and swelling after surgery. Do not use any lotions or creams on the incision until instructed by your surgeon.  Wear both TED hose on both legs during the day every day for three weeks, but may remove the TED hose at night at home.  Postoperative Constipation Protocol  Constipation - defined medically as fewer than three stools per week and severe constipation as less than one stool per week.  One of the most common issues patients have following surgery is constipation.  Even if you have a regular bowel pattern at home, your normal regimen is likely to be disrupted due to multiple reasons following surgery.  Combination of anesthesia, postoperative narcotics, change in appetite and fluid intake all can affect your bowels.  In order to avoid complications following surgery, here are some recommendations in order to help you during your recovery period.  Colace (docusate) - Pick up an over-the-counter form of Colace or another stool softener and take twice a day as long as you are requiring postoperative pain  medications.  Take with a full glass of water daily.  If you experience loose stools or diarrhea, hold the colace until you stool forms back up.  If your symptoms do not get better within 1 week or if they get worse, check with your doctor.  Dulcolax (bisacodyl) - Pick up over-the-counter and take as directed by the product packaging as needed to assist with the movement of your bowels.  Take with a full glass of water.  Use this product as needed if not relieved by Colace only.   MiraLax (polyethylene glycol) - Pick up over-the-counter to have on hand.  MiraLax is a solution that will increase the amount of water in your bowels to assist with bowel movements.  Take as directed and can mix with a glass of water, juice, soda, coffee, or tea.  Take if you go more than two days without a movement. Do not use MiraLax more than once per day. Call your doctor if you are still constipated or irregular after using this medication for 7 days in a row.  If you continue to have problems with postoperative constipation, please contact the office for further assistance and recommendations.  If you experience "the worst abdominal pain ever" or develop nausea or vomiting, please contact the office immediatly for further recommendations for treatment.    Do not put a pillow under the knee. Place it under the heel.   Complete by:  As directed    Do not sit on low chairs, stoools or toilet seats, as it may be difficult to get up from low surfaces   Complete by:  As directed    Driving restrictions   Complete by:  As directed    No driving until released by the physician.   Increase activity slowly as tolerated   Complete by:  As directed    Lifting restrictions   Complete by:  As directed    No lifting until released by the physician.   Patient may shower   Complete by:  As directed    You may shower without a dressing once there is no drainage.  Do not wash over the wound.  If drainage remains, do not shower  until drainage stops.   TED hose   Complete by:  As directed    Use stockings (TED hose) for 3 weeks on both leg(s).  You may remove them at night for sleeping.   Weight bearing as tolerated   Complete by:  As directed    Laterality:  bilateral   Extremity:  Lower     Allergies as of 01/31/2018      Reactions   Bee Venom Anaphylaxis   Epinephrine Other (See Comments), Hypertension   Elevated BP/shaking/sickness, including epi substitutes    Atorvastatin Other (See Comments)   Body pain, loss of sleep   Other Other (See Comments)   Perfume - headache Cigarettes-fainting   Hct [hydrochlorothiazide] Anxiety   Lisinopril Anxiety      Medication List    STOP taking these medications   CALCIUM-MAGNESIUM-VITAMIN D PO   cholecalciferol 400 units Tabs tablet Commonly known as:  VITAMIN D   CoQ-10 100 MG Caps   diclofenac sodium 1 % Gel Commonly known as:  VOLTAREN   Fish Oil 1200 MG Caps   GLUCOSAMINE-CHONDROITIN PO   HYALURONIC ACID PO   ibuprofen 800 MG tablet Commonly known as:  ADVIL,MOTRIN   multivitamin with minerals Tabs tablet   nitrofurantoin (macrocrystal-monohydrate) 100 MG capsule Commonly known as:  MACROBID   senna-docusate 8.6-50 MG tablet Commonly known as:  Senokot-S   vitamin E 400 UNIT capsule     TAKE these medications   bisacodyl 10 MG suppository Commonly known as:  DULCOLAX Place 1 suppository (10 mg total) rectally daily as needed for moderate constipation.   diphenhydrAMINE 12.5 MG/5ML elixir Commonly known as:  BENADRYL Take 5-10 mLs (12.5-25 mg total) by mouth every 4 (four) hours as needed for itching.   docusate sodium 100 MG capsule Commonly known as:  COLACE Take 1 capsule (100 mg total) by mouth 2 (two) times daily.     gabapentin 300 MG capsule Commonly known as:  NEURONTIN Take 1 capsule (300 mg total) by mouth 3 (three) times daily. Gabapentin 300 mg Protocol Take a 300 mg capsule three times a day for one more  week, Then a 300 mg capsule twice a day for two weeks, Then a 300 mg capsule once a day for two weeks, then discontinue the Gabapentin.   HYDROmorphone 2 MG tablet Commonly known as:  DILAUDID Take 1 tablets (2 mg total) by mouth every 6 (three) hours as needed for moderate pain or severe pain.   methocarbamol 500 MG tablet Commonly known as:  ROBAXIN Take 1 tablet (500 mg total) by mouth every 6 (six) hours as needed for muscle spasms.   metoprolol succinate 25 MG 24 hr tablet Commonly known as:  TOPROL-XL Take 1 tablet (25 mg total) by mouth daily.   naloxegol oxalate 25 MG Tabs tablet Commonly known as:  MOVANTIK Take 1 tablet (25 mg total) by mouth daily.   olmesartan 40 MG tablet Commonly known as:  BENICAR Take 1 tablet (40 mg total) by mouth daily.   ondansetron 4 MG tablet Commonly known as:  ZOFRAN Take 1 tablet (4 mg total) by mouth every 6 (six) hours as needed for nausea.   polyethylene glycol packet Commonly known as:  MIRALAX / GLYCOLAX Take 17 g by mouth daily as needed for mild constipation.   rivaroxaban 10 MG Tabs tablet Commonly known as:  XARELTO Take 1 tablet (10 mg total) by mouth daily. START Xarelto 10 mg daily on Monday 02/03/2018 and run daily for 4 weeks, then after completion of the Xarelto, start a baby 81 mg Aspirin daily at home for an additional 3 weeks.   sodium phosphate 7-19 GM/118ML  Enem Place 133 mLs (1 enema total) rectally once as needed for severe constipation.            Discharge Care Instructions  (From admission, onward)        Start     Ordered   01/31/18 0000  Weight bearing as tolerated    Question Answer Comment  Laterality bilateral   Extremity Lower      01/31/18 0837   01/31/18 0000  Change dressing    Comments:  Change dressing daily with sterile 4 x 4 inch gauze dressing and apply TED hose. Do not submerge the incision under water.   01/31/18 4707      Contact information for follow-up providers     Gaynelle Arabian, MD. Schedule an appointment as soon as possible for a visit on 02/11/2018.   Specialty:  Orthopedic Surgery Contact information: [address] [place] [address] [phone]            Contact information for after-discharge care    Destination    [facility] SNF/ALF Follow up.   Service:  Skilled Nursing Contact information: [address] [place] [phone]                  Signed: Arlee Muslim, PA-C Orthopaedic Surgery 02/03/2018, 7:34 AM

## 2018-01-31 NOTE — Progress Notes (Signed)
[place] for Warfarin to begin POD1, Epidural in place Indication: VTE Prophylaxis  Allergies  Allergen Reactions  . Bee Venom Anaphylaxis  . Epinephrine Other (See Comments) and Hypertension    Elevated BP/shaking/sickness, including epi substitutes   . Atorvastatin Other (See Comments)    Body pain, loss of sleep  . Other Other (See Comments)    Perfume - headache Cigarettes-fainting  . Hct [Hydrochlorothiazide] Anxiety  . Lisinopril Anxiety   Patient Measurements: Height: 5\' 3"  (160 cm) Weight: 173 lb (78.5 kg) IBW/kg (Calculated) : 52.4  Vital Signs: Temp: 98.4 F (36.9 C) (03/08 0454) Temp Source: Oral (03/08 0454) BP: 176/70 (03/08 0454) Pulse Rate: 55 (03/08 0454)  Labs: Recent Labs    01/30/18 0547 01/31/18 0600  HGB 11.1* 10.3*  HCT 34.1* 32.1*  PLT 209 214  LABPROT 14.2 19.0*  INR 1.11 1.61  CREATININE 0.69 0.59   Estimated Creatinine Clearance: 63 mL/min (by C-G formula based on SCr of 0.59 mg/dL).  Medications:  Scheduled:  . docusate sodium  100 mg Oral BID  . gabapentin  300 mg Oral TID  . irbesartan  300 mg Oral Daily  . metoprolol succinate  25 mg Oral Daily  . naloxegol oxalate  25 mg Oral Daily  . Warfarin - Pharmacist Dosing Inpatient   Does not apply q1800   Infusions:  . sodium chloride 100 mL/hr at 01/30/18 1352  . methocarbamol (ROBAXIN)  IV    . ropivacaine (PF) 2 mg/mL (0.2%) 10 mL/hr (01/30/18 1447)   Assessment: 86 yoF s/p bilateral knee replacements. Epidural Ropivacaine to POD1.  Warfarin for VTE prophylaxis to begin POD1 at 0900, can be given before epidural catheter removed.   Baseline INR 1.11, on no anticoagulation PTA  Begin Warfarin POD 1 at 0900, plan to remove epidural catheter in am 3/8, then begin Lovenox in pm 3/8  Today, 01/31/2018  INR today 1.61 after 4mg  Warfarin 3/7  Epidural catheter in place: PA to discuss with Anesthesia - concern - elevated INR  If epidural  removed today, plan Lovenox this evening, then bid Saturday, and transition to Xarelto 10mg  daily on Sunday 3/9  Goal of Therapy:  INR 2-3 Monitor platelets by anticoagulation protocol: Yes   Plan:   Warfarin discontinued  Minda Ditto PharmD Pager [phone] 01/31/2018, 7:01 AM

## 2018-01-31 NOTE — Addendum Note (Signed)
Addendum  created 01/31/18 1858 by Audry Pili, MD   Order list changed

## 2018-01-31 NOTE — Progress Notes (Signed)
Physical Therapy Treatment Patient Details Name: Denise Fox MRN: [id number] DOB: 1946-06-24 Today's Date: 01/31/2018    History of Present Illness s/p bil TKAs    PT Comments    Pt progressing, very motivated, decr gait tolerance today d/t feeling woozy from meds; epidural still in currently although pt reports pain is incr today compared to yesterday however pt with improved motor control LEs; continue to recommend SNF  Follow Up Recommendations  SNF     Equipment Recommendations  None recommended by PT    Recommendations for Other Services       Precautions / Restrictions Precautions Precautions: Fall;Knee Required Braces or Orthoses: Knee Immobilizer - Right;Knee Immobilizer - Left Knee Immobilizer - Right: Discontinue once straight leg raise with < 10 degree lag Knee Immobilizer - Left: Discontinue once straight leg raise with < 10 degree lag Restrictions Weight Bearing Restrictions: No Other Position/Activity Restrictions: WBAT    Mobility  Bed Mobility Overal bed mobility: Needs Assistance Bed Mobility: Supine to Sit     Supine to sit: Min assist;+2 for safety/equipment;+2 for physical assistance     General bed mobility comments: assist with LEs, positioning, cues for technique  Transfers Overall transfer level: Needs assistance Equipment used: Rolling walker (2 wheeled) Transfers: Sit to/from Stand Sit to Stand: Min assist;+2 safety/equipment;+2 physical assistance         General transfer comment: cues for hand placement  Ambulation/Gait Ambulation/Gait assistance: Min assist;+2 safety/equipment Ambulation Distance (Feet): 30 Feet Assistive device: Rolling walker (2 wheeled) Gait Pattern/deviations: Step-to pattern;Decreased step length - right;Decreased step length - left;Wide base of support     General Gait Details: cues for posture, RW safety, step length; bil KIs in place, improved LE control even though epidural still in place   Stairs             Wheelchair Mobility    Modified Rankin (Stroke Patients Only)       Balance             Standing balance-Leahy Scale: Poor Standing balance comment: reliant on UEs for static stand                            Cognition Arousal/Alertness: Awake/alert Behavior During Therapy: WFL for tasks assessed/performed Overall Cognitive Status: Within Functional Limits for tasks assessed                                        Exercises Total Joint Exercises Ankle Circles/Pumps: AROM;Both;10 reps Quad Sets: AROM;Both;10 reps Heel Slides: AAROM;Both;10 reps Goniometric ROM: right  8* to 60*; Left 10* to 55* AAROM knee flexion    General Comments        Pertinent Vitals/Pain Pain Assessment: 0-10 Pain Score: 5  Pain Location: right knee>left knee Pain Descriptors / Indicators: Discomfort;Sore Pain Intervention(s): Limited activity within patient's tolerance;Monitored during session;Premedicated before session;Ice applied    Home Living                      Prior Function            PT Goals (current goals can now be found in the care plan section) Acute Rehab PT Goals Patient Stated Goal: walk without pain PT Goal Formulation: With patient Time For Goal Achievement: 02/06/18 Potential to Achieve Goals: Good Progress towards PT goals: Progressing toward goals  Frequency    7X/week      PT Plan Current plan remains appropriate    Co-evaluation              AM-PAC PT "6 Clicks" Daily Activity  Outcome Measure  Difficulty turning over in bed (including adjusting bedclothes, sheets and blankets)?: Unable Difficulty moving from lying on back to sitting on the side of the bed? : Unable Difficulty sitting down on and standing up from a chair with arms (e.g., wheelchair, bedside commode, etc,.)?: Unable Help needed moving to and from a bed to chair (including a wheelchair)?: A Little Help needed walking in  hospital room?: A Lot Help needed climbing 3-5 steps with a railing? : Total 6 Click Score: 9    End of Session Equipment Utilized During Treatment: Gait belt;Right knee immobilizer;Left knee immobilizer Activity Tolerance: Patient tolerated treatment well Patient left: in chair;with call bell/phone within reach;with chair alarm set Nurse Communication: Mobility status PT Visit Diagnosis: Difficulty in walking, not elsewhere classified (R26.2)     Time: 9937-1696 PT Time Calculation (min) (ACUTE ONLY): 28 min  Charges:  $Gait Training: 8-22 mins $Therapeutic Exercise: 8-22 mins                    G CodesKenyon Ana, PT Pager: [phone] 01/31/2018    Kenyon Ana 01/31/2018, 12:01 PM

## 2018-01-31 NOTE — Clinical Social Work Placement (Signed)
Patient Not Medically ready. Insurance Authorization started for possible weekend discharge.   CLINICAL SOCIAL WORK PLACEMENT  NOTE  Date:  01/31/2018  Patient Details  Name: Denise Fox MRN: [id number] Date of Birth: Sep 03, 1946  Clinical Social Work is seeking post-discharge placement for this patient at the [place] level of care (*CSW will initial, date and re-position this form in  chart as items are completed):  No   Patient/family provided with [facility] Work Department's list of facilities offering this level of care within the geographic area requested by the patient (or if unable, by the patient's family).  Yes   Patient/family informed of their freedom to choose among providers that offer the needed level of care, that participate in Medicare, Medicaid or managed care program needed by the patient, have an available bed and are willing to accept the patient.  No   Patient/family informed of [facility]'s ownership interest in [facility] and [facility], as well as of the fact that they are under no obligation to receive care at these facilities.  PASRR submitted to EDS on 01/30/18     PASRR number received on 01/30/18     Existing PASRR number confirmed on       FL2 transmitted to all facilities in geographic area requested by pt/family on       FL2 transmitted to all facilities within larger geographic area on       Patient informed that his/her managed care company has contracts with or will negotiate with certain facilities, including the following:  [facility]     No   Patient/family informed of bed offers received.  Patient chooses bed at [facility] recommends and patient chooses bed at      Patient to be transferred to [facility] on  .  Patient to be transferred to facility by [facility]     Patient family notified on   of transfer.  Name of family member notified:         PHYSICIAN       Additional Comment:    _______________________________________________ Lia Hopping, LCSW 01/31/2018, 10:25 AM

## 2018-01-31 NOTE — Progress Notes (Signed)
Subjective: 2 Days Post-Op Procedure(s) (LRB): TOTAL KNEE BILATERAL (Bilateral) Patient reports pain as mild.   Patient seen in rounds with Dr. Wynelle Link. She looks great today. Patient is well, but has had some minor complaints of pain in the knees, requiring pain medications Epidural to come out today.  Anticipate possible increase in pain temporarily after the epidural has been removed.  She only received one dose of Coumadin yesterday but has a quick jump to INR of 1.61. Will discuss with Anesthesia.  Stopping Coumadin.  If can get epidural pulled this morning, then will start the Lovenox injections tonight.  Will do Lovenox injections tonight and twice on Saturday.  Will then DC the Lovenox and start the Xarelto 10 daily starting Sunday and will run Xarelto for 4 weeks. Plan is to go Skilled nursing facility after hospital stay. The patient has spoken with [facility] and would like to look into that facility.  Objective: Vital signs in last 24 hours: Temp:  [97.7 F (36.5 C)-98.6 F (37 C)] 98.4 F (36.9 C) (03/08 0454) Pulse Rate:  [55-66] 55 (03/08 0454) Resp:  [15-18] 17 (03/08 0454) BP: (119-176)/(56-70) 176/70 (03/08 0454) SpO2:  [94 %-100 %] 99 % (03/08 0454)  Intake/Output from previous day:  Intake/Output Summary (Last 24 hours) at 01/31/2018 0822 Last data filed at 01/31/2018 0814 Gross per 24 hour  Intake 3482.68 ml  Output 2600 ml  Net 882.68 ml    Intake/Output this shift: Total I/O In: 240 [P.O.:240] Out: -   Labs: Recent Labs    01/30/18 0547 01/31/18 0600  HGB 11.1* 10.3*   Recent Labs    01/30/18 0547 01/31/18 0600  WBC 13.9* 14.7*  RBC 3.84* 3.51*  HCT 34.1* 32.1*  PLT 209 214   Recent Labs    01/30/18 0547 01/31/18 0600  NA 142 140  K 4.1 3.8  CL 110 108  BUN 13 11  CREATININE 0.69 0.59  GLUCOSE 135* 118*  CALCIUM 9.1 8.8*   Recent Labs    01/30/18 0547 01/31/18 0600  INR 1.11 1.61    EXAM General - Patient is Alert,  Appropriate and Oriented Extremity - Neurovascular intact Sensation intact distally Intact pulses distally Dorsiflexion/Plantar flexion intact Dressing/Incision - clean, dry, no drainage to both incisions Motor Function - intact, moving feet and toes well on exam.    Past Medical History:  Diagnosis Date  . Arthritis   . Complication of anesthesia    Increased heart rate, and stops breathing in her sleep the 1st 24hrs after the procedure  . Complication of anesthesia    Sleep Apnea  . Hyperlipidemia   . Hypertension   . Myocardial infarction ([place]) 2001   no stents placed  . Prolapsing mitral valve   . Right bundle branch block   . Scoliosis     Assessment/Plan: 2 Days Post-Op Procedure(s) (LRB): TOTAL KNEE BILATERAL (Bilateral) Principal Problem:   OA (osteoarthritis) of knee  Estimated body mass index is 30.65 kg/m as calculated from the following:   Height as of this encounter: 5\' 3"  (1.6 m).   Weight as of this encounter: 78.5 kg (173 lb). Up with therapy  FOLEY Continue foley due to urinary output monitoring and they still has their epidural in place.   Will remove the catheter six hours after the epidural is removed.  Will need to note in chart when the epidural is pulled.  DVT Prophylaxis - Lovenox, Lovenox will not start until later this afternoon though after the  epidural has been removed for twelve hours.   Will need to note in chart when the epidural is pulled. Anticipate possible increase in pain temporarily after the epidural has been removed.  Weight-Bearing as tolerated to both legs  Take Xarelto for four weeks and then discontinue.  After completing the four weeks of Xarelto, the patient may stop the Coumadin and will switch to a baby 81 mg Aspirin daily for three additional weeks.  Lovenox injections will start later this evening after the epidural has been removed. The patient will receive one dose today and two dose on Saturday and then discontinue  the Loveonx and transition over to [place] on Sunday.  Arlee Muslim, PA-C Orthopaedic Surgery 01/31/2018, 8:22 AM

## 2018-01-31 NOTE — Discharge Instructions (Addendum)
° °Dr. Frank Aluisio °Total Joint Specialist °[facility] °[address] °[place], [place] [address] °[phone] ° °TOTAL KNEE REPLACEMENT POSTOPERATIVE DIRECTIONS ° °Knee Rehabilitation, Guidelines Following Surgery  °Results after knee surgery are often greatly improved when you follow the exercise, range of motion and muscle strengthening exercises prescribed by your doctor. Safety measures are also important to protect the knee from further injury. Any time any of these exercises cause you to have increased pain or swelling in your knee joint, decrease the amount until you are comfortable again and slowly increase them. If you have problems or questions, call your caregiver or physical therapist for advice.  ° °HOME CARE INSTRUCTIONS  °Remove items at home which could result in a fall. This includes throw rugs or furniture in walking pathways.  °· ICE to the affected knee every three hours for 30 minutes at a time and then as needed for pain and swelling.  Continue to use ice on the knee for pain and swelling from surgery. You may notice swelling that will progress down to the foot and ankle.  This is normal after surgery.  Elevate the leg when you are not up walking on it.   °· Continue to use the breathing machine which will help keep your temperature down.  It is common for your temperature to cycle up and down following surgery, especially at night when you are not up moving around and exerting yourself.  The breathing machine keeps your lungs expanded and your temperature down. °· Do not place pillow under knee, focus on keeping the knee straight while resting ° °DIET °You may resume your previous home diet once your are discharged from the hospital. ° °DRESSING / WOUND CARE / SHOWERING °You may shower 3 days after surgery, but keep the wounds dry during showering.  You may use an occlusive plastic wrap (Press'n Seal for example), NO SOAKING/SUBMERGING IN THE BATHTUB.  If the bandage gets  wet, change with a clean dry gauze.  If the incision gets wet, pat the wound dry with a clean towel. °You may start showering once you are discharged home but do not submerge the incision under water. Just pat the incision dry and apply a dry gauze dressing on daily. °Change the surgical dressing daily and reapply a dry dressing each time. ° °ACTIVITY °Walk with your walker as instructed. °Use walker as long as suggested by your caregivers. °Avoid periods of inactivity such as sitting longer than an hour when not asleep. This helps prevent blood clots.  °You may resume a sexual relationship in one month or when given the OK by your doctor.  °You may return to work once you are cleared by your doctor.  °Do not drive a car for 6 weeks or until released by you surgeon.  °Do not drive while taking narcotics. ° °WEIGHT BEARING °Weight bearing as tolerated with assist device (walker, cane, etc) as directed, use it as long as suggested by your surgeon or therapist, typically at least 4-6 weeks. ° °POSTOPERATIVE CONSTIPATION PROTOCOL °Constipation - defined medically as fewer than three stools per week and severe constipation as less than one stool per week. ° °One of the most common issues patients have following surgery is constipation.  Even if you have a regular bowel pattern at home, your normal regimen is likely to be disrupted due to multiple reasons following surgery.  Combination of anesthesia, postoperative narcotics, change in appetite and fluid intake all can affect your bowels.    In order to avoid complications following surgery, here are some recommendations in order to help you during your recovery period. ° °Colace (docusate) - Pick up an over-the-counter form of Colace or another stool softener and take twice a day as long as you are requiring postoperative pain medications.  Take with a full glass of water daily.  If you experience loose stools or diarrhea, hold the colace until you stool forms back up.  If  your symptoms do not get better within 1 week or if they get worse, check with your doctor. ° °Dulcolax (bisacodyl) - Pick up over-the-counter and take as directed by the product packaging as needed to assist with the movement of your bowels.  Take with a full glass of water.  Use this product as needed if not relieved by Colace only.  ° °MiraLax (polyethylene glycol) - Pick up over-the-counter to have on hand.  MiraLax is a solution that will increase the amount of water in your bowels to assist with bowel movements.  Take as directed and can mix with a glass of water, juice, soda, coffee, or tea.  Take if you go more than two days without a movement. °Do not use MiraLax more than once per day. Call your doctor if you are still constipated or irregular after using this medication for 7 days in a row. ° °If you continue to have problems with postoperative constipation, please contact the office for further assistance and recommendations.  If you experience "the worst abdominal pain ever" or develop nausea or vomiting, please contact the office immediatly for further recommendations for treatment. ° °ITCHING ° If you experience itching with your medications, try taking only a single pain pill, or even half a pain pill at a time.  You can also use Benadryl over the counter for itching or also to help with sleep.  ° °TED HOSE STOCKINGS °Wear the elastic stockings on both legs for three weeks following surgery during the day but you may remove then at night for sleeping. ° °MEDICATIONS °See your medication summary on the “After Visit Summary” that the nursing staff will review with you prior to discharge.  You may have some home medications which will be placed on hold until you complete the course of blood thinner medication.  It is important for you to complete the blood thinner medication as prescribed by your surgeon.  Continue your approved medications as instructed at time of discharge. ° °PRECAUTIONS °If you  experience chest pain or shortness of breath - call 911 immediately for transfer to the hospital emergency department.  °If you develop a fever greater that 101 F, purulent drainage from wound, increased redness or drainage from wound, foul odor from the wound/dressing, or calf pain - CONTACT YOUR SURGEON.   °                                                °FOLLOW-UP APPOINTMENTS °Make sure you keep all of your appointments after your operation with your surgeon and caregivers. You should call the office at the above phone number and make an appointment for approximately two weeks after the date of your surgery or on the date instructed by your surgeon outlined in the "After Visit Summary". ° ° °RANGE OF MOTION AND STRENGTHENING EXERCISES  °Rehabilitation of the knee is important following a knee injury or   an operation. After just a few days of immobilization, the muscles of the thigh which control the knee become weakened and shrink (atrophy). Knee exercises are designed to build up the tone and strength of the thigh muscles and to improve knee motion. Often times heat used for twenty to thirty minutes before working out will loosen up your tissues and help with improving the range of motion but do not use heat for the first two weeks following surgery. These exercises can be done on a training (exercise) mat, on the floor, on a table or on a bed. Use what ever works the best and is most comfortable for you Knee exercises include:  Leg Lifts - While your knee is still immobilized in a splint or cast, you can do straight leg raises. Lift the leg to 60 degrees, hold for 3 sec, and slowly lower the leg. Repeat 10-20 times 2-3 times daily. Perform this exercise against resistance later as your knee gets better.  Quad and Hamstring Sets - Tighten up the muscle on the front of the thigh (Quad) and hold for 5-10 sec. Repeat this 10-20 times hourly. Hamstring sets are done by pushing the foot backward against an object  and holding for 5-10 sec. Repeat as with quad sets.   Leg Slides: Lying on your back, slowly slide your foot toward your buttocks, bending your knee up off the floor (only go as far as is comfortable). Then slowly slide your foot back down until your leg is flat on the floor again.  Angel Wings: Lying on your back spread your legs to the side as far apart as you can without causing discomfort.  A rehabilitation program following serious knee injuries can speed recovery and prevent re-injury in the future due to weakened muscles. Contact your doctor or a physical therapist for more information on knee rehabilitation.   IF YOU ARE TRANSFERRED TO A SKILLED REHAB FACILITY If the patient is transferred to a skilled rehab facility following release from the hospital, a list of the current medications will be sent to the facility for the patient to continue.  When discharged from the skilled rehab facility, please have the facility set up the patient's [place] prior to being released. Also, the skilled facility will be responsible for providing the patient with their medications at time of release from the facility to include their pain medication, the muscle relaxants, and their blood thinner medication. If the patient is still at the rehab facility at time of the two week follow up appointment, the skilled rehab facility will also need to assist the patient in arranging follow up appointment in our office and any transportation needs.  MAKE SURE YOU:  Understand these instructions.  Get help right away if you are not doing well or get worse.    Pick up stool softner and laxative for home use following surgery while on pain medications. Do not submerge incision under water. Please use good hand washing techniques while changing dressing each day. May shower starting three days after surgery. Please use a clean towel to pat the incision dry following showers. Continue to use ice for  pain and swelling after surgery. Do not use any lotions or creams on the incision until instructed by your surgeon.   Take Lovenox injection 30 mg Sub-Q every 12 hours for two doses on Saturday 02/01/2018. Discontinue the Lovenox on Sunday 02/02/2018  START Xarelto 10 daily on Sunday 02/02/2018 and run for 4 weeks, then  after completion of the Xarelto, start a baby 81 mg Aspirin daily at home for an additional 3 weeks.  Information on my medicine - XARELTO (Rivaroxaban)  Why was Xarelto prescribed for you? Xarelto was prescribed for you to reduce the risk of blood clots forming after orthopedic surgery. The medical term for these abnormal blood clots is venous thromboembolism (VTE).  What do you need to know about xarelto ? Take your Xarelto ONCE DAILY at the same time every day. You may take it either with or without food.  If you have difficulty swallowing the tablet whole, you may crush it and mix in applesauce just prior to taking your dose.  Take Xarelto exactly as prescribed by your doctor and DO NOT stop taking Xarelto without talking to the doctor who prescribed the medication.  Stopping without other VTE prevention medication to take the place of Xarelto may increase your risk of developing a clot.  After discharge, you should have regular check-up appointments with your healthcare provider that is prescribing your Xarelto.    What do you do if you miss a dose? If you miss a dose, take it as soon as you remember on the same day then continue your regularly scheduled once daily regimen the next day. Do not take two doses of Xarelto on the same day.   Important Safety Information A possible side effect of Xarelto is bleeding. You should call your healthcare provider right away if you experience any of the following: ? Bleeding from an injury or your nose that does not stop. ? Unusual colored urine (red or dark brown) or unusual colored stools (red or black). ? Unusual  bruising for unknown reasons. ? A serious fall or if you hit your head (even if there is no bleeding).  Some medicines may interact with Xarelto and might increase your risk of bleeding while on Xarelto. To help avoid this, consult your healthcare provider or pharmacist prior to using any new prescription or non-prescription medications, including herbals, vitamins, non-steroidal anti-inflammatory drugs (NSAIDs) and supplements.  This website has more information on Xarelto: [email or web address]

## 2018-02-01 LAB — CBC
HCT: 30.1 % — ABNORMAL LOW (ref 36.0–46.0)
Hemoglobin: 9.9 g/dL — ABNORMAL LOW (ref 12.0–15.0)
MCH: 29.7 pg (ref 26.0–34.0)
MCHC: 32.9 g/dL (ref 30.0–36.0)
MCV: 90.4 fL (ref 78.0–100.0)
PLATELETS: 206 10*3/uL (ref 150–400)
RBC: 3.33 MIL/uL — AB (ref 3.87–5.11)
RDW: 14.6 % (ref 11.5–15.5)
WBC: 11.2 10*3/uL — ABNORMAL HIGH (ref 4.0–10.5)

## 2018-02-01 LAB — PROTIME-INR
INR: 1.27
PROTHROMBIN TIME: 15.8 s — AB (ref 11.4–15.2)

## 2018-02-01 MED ORDER — ENOXAPARIN SODIUM 30 MG/0.3ML ~~LOC~~ SOLN
30.0000 mg | Freq: Two times a day (BID) | SUBCUTANEOUS | Status: AC
  Administered 2018-02-02 (×2): 30 mg via SUBCUTANEOUS
  Filled 2018-02-01 (×2): qty 0.3

## 2018-02-01 MED ORDER — RIVAROXABAN 10 MG PO TABS
10.0000 mg | ORAL_TABLET | Freq: Every day | ORAL | Status: DC
  Administered 2018-02-03: 11:00:00 10 mg via ORAL

## 2018-02-01 MED ORDER — ROPIVACAINE HCL 2 MG/ML IJ SOLN
10.0000 mL/h | INTRAMUSCULAR | Status: DC
  Administered 2018-02-01: 10 mL/h via EPIDURAL
  Filled 2018-02-01: qty 200

## 2018-02-01 NOTE — Addendum Note (Signed)
Addendum  created 02/01/18 1443 by Lyndle Herrlich, MD   Sign clinical note

## 2018-02-01 NOTE — Progress Notes (Signed)
   Subjective: 3 Days Post-Op Procedure(s) (LRB): TOTAL KNEE BILATERAL (Bilateral)  Pt still has epidural in place Has some sensation to the knees but still unable to move them due to the epidural Denies any new symptoms or other issues Foley still in place Patient reports pain as mild.  Objective:   VITALS:   Vitals:   02/01/18 0439 02/01/18 0648  BP: 132/65   Pulse: 85   Resp: 15 16  Temp: 99.4 F (37.4 C)   SpO2: 94% 93%    Bilateral knee incisions healing well nv intact distally No rashes or edema distally Foley and epidural still in place  LABS Recent Labs    01/30/18 0547 01/31/18 0600 02/01/18 0544  HGB 11.1* 10.3* 9.9*  HCT 34.1* 32.1* 30.1*  WBC 13.9* 14.7* 11.2*  PLT 209 214 206    Recent Labs    01/30/18 0547 01/31/18 0600  NA 142 140  K 4.1 3.8  BUN 13 11  CREATININE 0.69 0.59  GLUCOSE 135* 118*     Assessment/Plan: 3 Days Post-Op Procedure(s) (LRB): TOTAL KNEE BILATERAL (Bilateral) Plan for epidural to be removed today Remove foley 6 HOURS after epidural removed Plan for SNF placement on Monday Pain management as needed once epidural removed PT/OT as able    Merla Riches PA-C, [facility]  [facility] [address], [address], [place], [place] [address] Phone: [phone] [email or web address] Facebook  [employer]

## 2018-02-01 NOTE — Progress Notes (Signed)
Physical Therapy Treatment Patient Details Name: Denise Fox MRN: [id number] DOB: Jun 09, 1946 Today's Date: 02/01/2018    History of Present Illness s/p bil TKAs    PT Comments    Pt progressing although gait distance limited by fatigue today; pt is frustrated that her epidural is still in even though her INR is down to 1.27; she feels just generally worse today and is also not taking any pain meds d/t the side effects; certainly concerned about pt experiencing rebound pain when epidual si d/c'd, discusses with pt however she continues to decline calling RN for any meds   Follow Up Recommendations  SNF     Equipment Recommendations  None recommended by PT    Recommendations for Other Services       Precautions / Restrictions Precautions Precautions: Fall;Knee Required Braces or Orthoses: Knee Immobilizer - Right;Knee Immobilizer - Left Knee Immobilizer - Right: Discontinue once straight leg raise with < 10 degree lag Knee Immobilizer - Left: Discontinue once straight leg raise with < 10 degree lag Restrictions Weight Bearing Restrictions: No Other Position/Activity Restrictions: WBAT    Mobility  Bed Mobility Overal bed mobility: Needs Assistance Bed Mobility: Supine to Sit     Supine to sit: Min assist;Mod assist     General bed mobility comments: assist with LEs, positioning, cues for technique and safety  Transfers Overall transfer level: Needs assistance Equipment used: Rolling walker (2 wheeled) Transfers: Sit to/from Stand Sit to Stand: Min assist;+2 safety/equipment;+2 physical assistance         General transfer comment: cues for hand placement,  LE position adn technique  Ambulation/Gait Ambulation/Gait assistance: Min assist;+2 safety/equipment Ambulation Distance (Feet): 16 Feet Assistive device: Rolling walker (2 wheeled) Gait Pattern/deviations: Step-to pattern;Decreased step length - right;Decreased step length - left;Wide base of support      General Gait Details: cues for posture, RW safety, step length; bil KIs in place,  heavy reliance on UEs   Stairs            Wheelchair Mobility    Modified Rankin (Stroke Patients Only)       Balance             Standing balance-Leahy Scale: Poor Standing balance comment: reliant on UEs for static stand and external support                            Cognition Arousal/Alertness: Awake/alert Behavior During Therapy: WFL for tasks assessed/performed Overall Cognitive Status: Within Functional Limits for tasks assessed                                 General Comments: pt reports generally not feeling well today, very fatigued and painful--she is not taking pain meds d/t how they make her feel      Exercises Total Joint Exercises Ankle Circles/Pumps: AROM;Both;10 reps Quad Sets: AROM;Both;10 reps Short Arc Quad: AROM;Right;10 reps Heel Slides: AAROM;Both;10 reps Hip ABduction/ADduction: AAROM;Both;10 reps Straight Leg Raises: AAROM;AROM;Both;10 reps Goniometric ROM: right knee 10* to 65*; left knee 8* to 60* AAROM    General Comments        Pertinent Vitals/Pain Pain Assessment: 0-10 Pain Score: 4  Pain Location: thighs, bil knees Pain Descriptors / Indicators: Grimacing;Guarding;Sore Pain Intervention(s): Monitored during session;Other (comment)(epidural)    Home Living  Prior Function            PT Goals (current goals can now be found in the care plan section) Acute Rehab PT Goals Patient Stated Goal: walk without pain PT Goal Formulation: With patient Time For Goal Achievement: 02/06/18 Potential to Achieve Goals: Good Progress towards PT goals: Progressing toward goals    Frequency    7X/week      PT Plan Current plan remains appropriate    Co-evaluation              AM-PAC PT "6 Clicks" Daily Activity  Outcome Measure  Difficulty turning over in bed (including  adjusting bedclothes, sheets and blankets)?: Unable Difficulty moving from lying on back to sitting on the side of the bed? : Unable Difficulty sitting down on and standing up from a chair with arms (e.g., wheelchair, bedside commode, etc,.)?: Unable Help needed moving to and from a bed to chair (including a wheelchair)?: A Lot Help needed walking in hospital room?: A Lot Help needed climbing 3-5 steps with a railing? : Total 6 Click Score: 8    End of Session Equipment Utilized During Treatment: Gait belt;Right knee immobilizer;Left knee immobilizer Activity Tolerance: Patient limited by fatigue Patient left: in chair;with call bell/phone within reach;with chair alarm set Nurse Communication: Mobility status PT Visit Diagnosis: Difficulty in walking, not elsewhere classified (R26.2)     Time: 1115-5208 PT Time Calculation (min) (ACUTE ONLY): 33 min  Charges:  $Gait Training: 8-22 mins $Therapeutic Exercise: 8-22 mins                    G CodesKenyon Ana, PT Pager: [phone] 02/01/2018    Kenyon Ana 02/01/2018, 1:20 PM

## 2018-02-01 NOTE — Anesthesia Pain Management Evaluation Note (Signed)
Anesthesia Epidural Rounding Pt seen and examined. Sitting in chair comfortably. Pt given coumadin in am of 3/7 with INR bump to 1.6. Discussed with patient rechecking INR at 4pm and removing epidural if INR below 1.5.   4-5/5 strength in BLE Epidural site CDI  Plan: Continue epidural at D/C this evening if INR <1.5. If INR remains up, will check in am of 3/9 and d/c epidural then.

## 2018-02-01 NOTE — Addendum Note (Signed)
Addendum  created 02/01/18 1011 by Nolon Nations, MD   Sign clinical note

## 2018-02-01 NOTE — Progress Notes (Signed)
INR less than 1.5 Epidural out.... intact tip.

## 2018-02-01 NOTE — Progress Notes (Signed)
02/01/18 1700  PT Visit Information  Pt cooperative and remains motivated however with overall very depressed mood today; friend in room with her and RN is aawre  Assistance Needed +2  History of Present Illness s/p bil TKAs  Subjective Data  Patient Stated Goal walk without pain  Precautions  Precautions Fall;Knee  Required Braces or Orthoses Knee Immobilizer - Right;Knee Immobilizer - Left  Knee Immobilizer - Right Discontinue once straight leg raise with < 10 degree lag  Knee Immobilizer - Left Discontinue once straight leg raise with < 10 degree lag  Restrictions  Weight Bearing Restrictions No  Other Position/Activity Restrictions WBAT  Pain Assessment  Pain Assessment 0-10  Pain Score 6 (in places, 3 in places)  Pain Location thighs, bil knees  Pain Descriptors / Indicators Grimacing;Guarding;Sore  Pain Intervention(s) Limited activity within patient's tolerance;Monitored during session;Repositioned;Ice applied  Cognition  Arousal/Alertness Awake/alert  Behavior During Therapy WFL for tasks assessed/performed  Overall Cognitive Status Within Functional Limits for tasks assessed  General Comments pt reports generally not feeling well today, very fatigued and painful--she is not taking pain meds d/t how they make her feel  Bed Mobility  Overal bed mobility Needs Assistance  Bed Mobility Supine to Sit  Supine to sit Min assist;Mod assist  General bed mobility comments assist with LEs, positioning, cues for technique and safety  Transfers  Overall transfer level Needs assistance  Equipment used Rolling walker (2 wheeled)  Transfers Sit to/from Stand  Sit to Stand +2 safety/equipment;+2 physical assistance;Mod assist  General transfer comment cues for hand placement,  LE position and technique; +2 mod assist to rise and stabilize from the chair  Ambulation/Gait  Ambulation/Gait assistance Min assist;+2 safety/equipment;+2 physical assistance  Ambulation Distance (Feet) 50  Feet  Assistive device Rolling walker (2 wheeled)  Gait Pattern/deviations Step-to pattern;Decreased step length - right;Decreased step length - left;Wide base of support  General Gait Details cues for posture, RW safety, step length; bil KIs in place,  heavy reliance on UEs, 3 standing breaks  Gait velocity interpretation Below normal speed for age/gender  Balance  Standing balance-Leahy Scale Poor  Standing balance comment reliant on UEs for static stand and external support  Total Joint Exercises  Ankle Circles/Pumps AROM;Both;10 reps  Quad Sets AROM;Both;10 reps  PT - End of Session  Equipment Utilized During Treatment Gait belt;Right knee immobilizer;Left knee immobilizer  Activity Tolerance Patient limited by fatigue  Patient left in chair;with call bell/phone within reach;with chair alarm set  Nurse Communication Mobility status  PT - Assessment/Plan  PT Plan Current plan remains appropriate  PT Visit Diagnosis Difficulty in walking, not elsewhere classified (R26.2)  PT Frequency (ACUTE ONLY) 7X/week  Follow Up Recommendations SNF  PT equipment None recommended by PT  AM-PAC PT "6 Clicks" Daily Activity Outcome Measure  Difficulty turning over in bed (including adjusting bedclothes, sheets and blankets)? 1  Difficulty moving from lying on back to sitting on the side of the bed?  1  Difficulty sitting down on and standing up from a chair with arms (e.g., wheelchair, bedside commode, etc,.)? 1  Help needed moving to and from a bed to chair (including a wheelchair)? 2  Help needed walking in hospital room? 2  Help needed climbing 3-5 steps with a railing?  1  6 Click Score 8  Mobility G Code  CM  Acute Rehab PT Goals  PT Goal Formulation With patient  Time For Goal Achievement 02/06/18  Potential to Achieve Goals Good  PT Time  Calculation  PT Start Time (ACUTE ONLY) 1543  PT Stop Time (ACUTE ONLY) 1607  PT Time Calculation (min) (ACUTE ONLY) 24 min  PT General Charges  $$  ACUTE PT VISIT 1 Visit  PT Treatments  $Gait Training 23-37 mins

## 2018-02-02 MED ORDER — ACETAMINOPHEN 325 MG PO TABS
650.0000 mg | ORAL_TABLET | Freq: Four times a day (QID) | ORAL | Status: DC | PRN
  Administered 2018-02-02 – 2018-02-03 (×4): 650 mg via ORAL
  Filled 2018-02-02 (×3): qty 2

## 2018-02-02 NOTE — Progress Notes (Signed)
PT TREATMENT NOTE 02/02/18 1500   Pt is progressing nicely; a little more pain this afternoon limiting gait distance, pt remains motivated and is in better spirits overall; requiring less assist with bed mobility and transfers, still unable to perform SLR without assist therefore utilized bil KIs, discussed KI protocol with pt and she verbalizes understanding. continue to recommend SNF level care post acute; pt planning to go to [facility] possibly tomorrow; continue PT POC   PT Visit Information  Last PT Received On 02/02/18  Assistance Needed +2 (for safety with  gait)  History of Present Illness s/p bil TKAs  Subjective Data  Subjective I feel good  Patient Stated Goal to be independent  Precautions  Precautions Fall;Knee  Required Braces or Orthoses Knee Immobilizer - Right;Knee Immobilizer - Left  Knee Immobilizer - Right Discontinue once straight leg raise with < 10 degree lag  Knee Immobilizer - Left Discontinue once straight leg raise with < 10 degree lag  Restrictions  Weight Bearing Restrictions No  Other Position/Activity Restrictions WBAT  Pain Assessment  Pain Assessment 0-10  Pain Score 4  Pain Location thighs, bil knees  Pain Descriptors / Indicators Aching;Discomfort;Grimacing  Pain Intervention(s) Monitored during session;Limited activity within patient's tolerance  Cognition  Arousal/Alertness Awake/alert  Behavior During Therapy WFL for tasks assessed/performed  Overall Cognitive Status Within Functional Limits for tasks assessed  Bed Mobility  Overal bed mobility Needs Assistance  Bed Mobility Sit to Supine;Supine to Sit  Rolling Min assist  Supine to sit Min guard;Supervision  Sit to supine Min assist  General bed mobility comments assist with LEs, positioning, cues for technique and safety ( pt not following cues and technique for bed mobility )  Transfers  Overall transfer level Needs assistance  Equipment used Rolling walker (2 wheeled)  Transfers Sit  to/from Stand  Sit to [employer] physical assistance;+2 safety/equipment  General transfer comment cues for hand placement,  LE position and technique;   Ambulation/Gait  Ambulation/Gait assistance Min assist;Min guard;+2 safety/equipment  Assistive device Rolling walker (2 wheeled)  Gait Pattern/deviations Step-to pattern;Decreased step length - right;Decreased step length - left;Wide base of support  General Gait Details cues for posture, RW safety, step length; bil KIs in place, decreasing reliance on UEs, 2 standing breaks  Balance  Standing balance-Leahy Scale Fair  PT - End of Session  Equipment Utilized During Treatment Gait belt;Right knee immobilizer;Left knee immobilizer  Activity Tolerance Patient tolerated treatment well  Patient left in bed;with call bell/phone within reach;with bed alarm set;with family/visitor present  Nurse Communication Mobility status  PT - Assessment/Plan  PT Plan Current plan remains appropriate  PT Visit Diagnosis Difficulty in walking, not elsewhere classified (R26.2)  PT Frequency (ACUTE ONLY) 7X/week  Follow Up Recommendations SNF  PT equipment None recommended by PT  AM-PAC PT "6 Clicks" Daily Activity Outcome Measure  Difficulty turning over in bed (including adjusting bedclothes, sheets and blankets)? 1  Difficulty moving from lying on back to sitting on the side of the bed?  1  Difficulty sitting down on and standing up from a chair with arms (e.g., wheelchair, bedside commode, etc,.)? 1  Help needed moving to and from a bed to chair (including a wheelchair)? 3  Help needed walking in hospital room? 3  Help needed climbing 3-5 steps with a railing?  1  6 Click Score 10  Mobility G Code  CL  PT Goal Progression  Progress towards PT goals Progressing toward goals  Acute Rehab PT Goals  PT Goal Formulation With patient  Time For Goal Achievement 02/06/18  Potential to Achieve Goals Good  PT Time Calculation  PT Start Time (ACUTE  ONLY) 1449  PT Stop Time (ACUTE ONLY) 1520  PT Time Calculation (min) (ACUTE ONLY) 31 min  PT General Charges  $$ ACUTE PT VISIT 1 Visit  PT Treatments  $Gait Training 8-22 mins  $Therapeutic Activity 8-22 mins  Kenyon Ana, [place] Pager: [phone] 02/02/2018

## 2018-02-02 NOTE — Progress Notes (Signed)
Physical Therapy Treatment Patient Details Name: Denise Fox MRN: [id number] DOB: [birth year] Today's Date: 02/02/2018    History of Present Illness s/p bil TKAs    PT Comments    Pt progressing, incr gait distance this session and improved LE control since epidural out; improved affect today; more pain but pt declines meds other than tylenol; continue to recommend  SNF level therapies post acute  Follow Up Recommendations  SNF     Equipment Recommendations  None recommended by PT    Recommendations for Other Services       Precautions / Restrictions Precautions Precautions: Fall;Knee Required Braces or Orthoses: Knee Immobilizer - Right;Knee Immobilizer - Left Knee Immobilizer - Right: Discontinue once straight leg raise with < 10 degree lag Knee Immobilizer - Left: Discontinue once straight leg raise with < 10 degree lag Restrictions Weight Bearing Restrictions: No Other Position/Activity Restrictions: WBAT    Mobility  Bed Mobility Overal bed mobility: Needs Assistance Bed Mobility: Sit to Supine;Rolling Rolling: Min assist     Sit to supine: Min assist;Mod assist;+2 for physical assistance;+2 for safety/equipment   General bed mobility comments: assist with LEs, positioning, cues for technique and safety ( pt not following cues and technique for bed mobility )  Transfers Overall transfer level: Needs assistance Equipment used: Rolling walker (2 wheeled) Transfers: Sit to/from Stand Sit to Stand: Min assist;+2 physical assistance;+2 safety/equipment         General transfer comment: cues for hand placement,  LE position and technique;   Ambulation/Gait Ambulation/Gait assistance: Min assist;Min guard;+2 safety/equipment Ambulation Distance (Feet): 80 Feet Assistive device: Rolling walker (2 wheeled) Gait Pattern/deviations: Step-to pattern;Decreased step length - right;Decreased step length - left;Wide base of support     General Gait Details: cues for  posture, RW safety, step length; bil KIs in place, decreasing reliance on UEs, 2 standing breaks   Stairs            Wheelchair Mobility    Modified Rankin (Stroke Patients Only)       Balance                                            Cognition Arousal/Alertness: Awake/alert Behavior During Therapy: WFL for tasks assessed/performed Overall Cognitive Status: Within Functional Limits for tasks assessed                                        Exercises Total Joint Exercises Ankle Circles/Pumps: AROM;Both;10 reps Quad Sets: AROM;Both;10 reps Heel Slides: AAROM;Both;10 reps Hip ABduction/ADduction: AAROM;Both;10 reps Straight Leg Raises: AAROM;AROM;Both;10 reps Goniometric ROM: right knee flexion 10* to 85*, Left knee 8* to 72*    General Comments        Pertinent Vitals/Pain Pain Assessment: 0-10 Pain Score: 5  Pain Location: thighs, bil knees Pain Descriptors / Indicators: Aching;Discomfort;Grimacing Pain Intervention(s): Limited activity within patient's tolerance;Monitored during session;Repositioned;Ice applied    Home Living                      Prior Function            PT Goals (current goals can now be found in the care plan section) Acute Rehab PT Goals Patient Stated Goal: to be independent PT Goal Formulation: With patient Time For Goal  Achievement: 02/06/18 Potential to Achieve Goals: Good Progress towards PT goals: Progressing toward goals    Frequency    7X/week      PT Plan Current plan remains appropriate    Co-evaluation              AM-PAC PT "6 Clicks" Daily Activity  Outcome Measure  Difficulty turning over in bed (including adjusting bedclothes, sheets and blankets)?: Unable Difficulty moving from lying on back to sitting on the side of the bed? : Unable Difficulty sitting down on and standing up from a chair with arms (e.g., wheelchair, bedside commode, etc,.)?:  Unable Help needed moving to and from a bed to chair (including a wheelchair)?: A Lot Help needed walking in hospital room?: A Lot Help needed climbing 3-5 steps with a railing? : Total 6 Click Score: 8    End of Session Equipment Utilized During Treatment: Gait belt;Right knee immobilizer;Left knee immobilizer Activity Tolerance: Patient tolerated treatment well Patient left: in bed;with call bell/phone within reach;with bed alarm set Nurse Communication: Mobility status PT Visit Diagnosis: Difficulty in walking, not elsewhere classified (R26.2)     Time: 8832-5498 PT Time Calculation (min) (ACUTE ONLY): 39 min  Charges:  $Gait Training: 8-22 mins $Therapeutic Exercise: 23-37 mins                    G CodesKenyon Ana, PT Pager: [phone] 02/02/2018    Kenyon Ana 02/02/2018, 1:26 PM

## 2018-02-02 NOTE — Progress Notes (Signed)
Occupational Therapy Evaluation Patient Details Name: Denise Fox MRN: [id number] DOB: 05-16-1946 Today's Date: 02/02/2018    History of Present Illness s/p bil TKAs   Clinical Impression   PTA, pt independent with ADL and mobility, although she states "everything was difficult". Pt complaining of stomach pain this am and states "I'm so disappointed with how I'm doing". Pt able to ambulate to bathroom and have a BM, then ambulate back to her recliner. Pt feeling much better after using the bathroom and doing some of her self care. Will follow acutely to facilitate DC to snf for rehab. Pt very appreciative.     Follow Up Recommendations  SNF;Supervision/Assistance - 24 hour    Equipment Recommendations  Other (comment)(TBA)    Recommendations for Other Services       Precautions / Restrictions Precautions Precautions: Fall;Knee Required Braces or Orthoses: Knee Immobilizer - Right;Knee Immobilizer - Left Knee Immobilizer - Right: Discontinue once straight leg raise with < 10 degree lag Knee Immobilizer - Left: Discontinue once straight leg raise with < 10 degree lag Restrictions Other Position/Activity Restrictions: WBAT      Mobility Bed Mobility Overal bed mobility: Needs Assistance       Supine to sit: Supervision        Transfers Overall transfer level: Needs assistance Equipment used: Rolling walker (2 wheeled) Transfers: Sit to/from Stand Sit to Stand: Mod assist              Balance     Sitting balance-Leahy Scale: Good       Standing balance-Leahy Scale: Fair                             ADL either performed or assessed with clinical judgement   ADL Overall ADL's : Needs assistance/impaired     Grooming: Set up;Standing   Upper Body Bathing: Set up;Sitting   Lower Body Bathing: Moderate assistance;Sit to/from stand   Upper Body Dressing : Set up;Sitting   Lower Body Dressing: Maximal assistance;Sit to/from stand   Toilet  Transfer: Moderate assistance;RW;Grab bars;Ambulation;Comfort height toilet   Toileting- Clothing Manipulation and Hygiene: Minimal assistance;Sit to/from stand;Sitting/lateral lean       Functional mobility during ADLs: Moderate assistance;Rolling walker General ADL Comments: using B KI. Will benefit form use of AE     Vision Baseline Vision/History: Wears glasses       Perception     Praxis      Pertinent Vitals/Pain Pain Assessment: 0-10 Pain Score: 6  Pain Location: thighs, bil knees Pain Descriptors / Indicators: Aching;Discomfort;Grimacing Pain Intervention(s): Limited activity within patient's tolerance;Repositioned;Ice applied     Hand Dominance Right   Extremity/Trunk Assessment Upper Extremity Assessment Upper Extremity Assessment: Overall WFL for tasks assessed   Lower Extremity Assessment Lower Extremity Assessment: Defer to PT evaluation   Cervical / Trunk Assessment Cervical / Trunk Assessment: Normal   Communication Communication Communication: No difficulties   Cognition Arousal/Alertness: Awake/alert Behavior During Therapy: WFL for tasks assessed/performed Overall Cognitive Status: Within Functional Limits for tasks assessed                                     General Comments       Exercises     Shoulder Instructions      Home Living Family/patient expects to be discharged to:: Skilled nursing facility  Prior Functioning/Environment Level of Independence: Independent                 OT Problem List: Decreased strength;Decreased range of motion;Decreased activity tolerance;Impaired balance (sitting and/or standing);Decreased safety awareness;Decreased knowledge of use of DME or AE;Pain      OT Treatment/Interventions: Self-care/ADL training;Therapeutic exercise;DME and/or AE instruction;Therapeutic activities;Patient/family education    OT Goals(Current  goals can be found in the care plan section) Acute Rehab OT Goals Patient Stated Goal: to be independent OT Goal Formulation: With patient Time For Goal Achievement: 02/16/18 Potential to Achieve Goals: Good  OT Frequency: Min 2X/week   Barriers to D/C:            Co-evaluation              AM-PAC PT "6 Clicks" Daily Activity     Outcome Measure Help from another person eating meals?: None Help from another person taking care of personal grooming?: None Help from another person toileting, which includes using toliet, bedpan, or urinal?: A Lot Help from another person bathing (including washing, rinsing, drying)?: A Lot Help from another person to put on and taking off regular upper body clothing?: None Help from another person to put on and taking off regular lower body clothing?: A Lot 6 Click Score: 18   End of Session Equipment Utilized During Treatment: Gait belt;Rolling walker;Right knee immobilizer;Left knee immobilizer CPM Left Knee CPM Left Knee: Off CPM Right Knee CPM Right Knee: Off Nurse Communication: Mobility status  Activity Tolerance: Patient tolerated treatment well Patient left: in chair;with call bell/phone within reach  OT Visit Diagnosis: Other abnormalities of gait and mobility (R26.89);Muscle weakness (generalized) (M62.81);Pain Pain - Right/Left: (B) Pain - part of body: Knee                Time: 1423-9532 OT Time Calculation (min): 36 min Charges:  OT General Charges $OT Visit: 1 Visit OT Evaluation $OT Eval Moderate Complexity: 1 Mod OT Treatments $Self Care/Home Management : 8-22 mins G-Codes:     [facility], OT/L  [phone] 02/02/2018  Javarus Dorner,HILLARY 02/02/2018, 9:50 AM

## 2018-02-02 NOTE — Progress Notes (Signed)
   Subjective: 4 Days Post-Op Procedure(s) (LRB): TOTAL KNEE BILATERAL (Bilateral)  Epidural and foley have been removed lovenox has started Pt discouraged with lack of progress and would like to walk as soon as possible Would like some tylenol and a suppository Patient reports pain as mild.  Objective:   VITALS:   Vitals:   02/01/18 2113 02/02/18 0449  BP: (!) 145/69 124/72  Pulse: 82 75  Resp: 15 18  Temp: 99 F (37.2 C) 98.4 F (36.9 C)  SpO2: 94% 94%    Bilateral knees incisions healing well nv intact distally No rashes or edema distally Good early rom  LABS Recent Labs    01/31/18 0600 02/01/18 0544  HGB 10.3* 9.9*  HCT 32.1* 30.1*  WBC 14.7* 11.2*  PLT 214 206    Recent Labs    01/31/18 0600  NA 140  K 3.8  BUN 11  CREATININE 0.59  GLUCOSE 118*     Assessment/Plan: 4 Days Post-Op Procedure(s) (LRB): TOTAL KNEE BILATERAL (Bilateral) Continue PT/OT Weight bearing as tolerated Will order tylenol and extra suppository as needed for constipation D/c planning   Brad Luna [place], Foley is now [employer]  [address] [place], [place], [place] [address] Phone: [phone] [email or web address] Facebook  [employer]

## 2018-02-02 NOTE — Care Management Note (Signed)
Case Management Note  Patient Details  Name: Stachia Slutsky MRN: [id number] Date of Birth: 12/27/1945  Subjective/Objective:    Total Bilateral Knee Replacement                Action/Plan: Chart reviewed. Pt is being followed by CSW for SNF placement. Waiting insurance authorization.   Expected Discharge Date:  01/31/18               Expected Discharge Plan:  Skilled Nursing Facility  In-House Referral:  Clinical Social Work  Discharge planning Services  CM Consult  Post Acute Care Choice:  NA Choice offered to:  NA  DME Arranged:  N/A DME Agency:  NA  HH Arranged:  NA HH Agency:  NA  Status of Service:  Completed, signed off  If discussed at [employer] of Stay Meetings, dates discussed:    Additional Comments:  Erenest Rasher, RN 02/02/2018, 9:57 AM

## 2018-02-03 MED ORDER — ONDANSETRON HCL 4 MG PO TABS: 4.0000 mg | ORAL_TABLET | Freq: Four times a day (QID) | ORAL | 0 refills | Status: DC | PRN

## 2018-02-03 MED ORDER — BISACODYL 10 MG RE SUPP
10.0000 mg | Freq: Once | RECTAL | Status: AC
  Administered 2018-02-03: 07:00:00 10 mg via RECTAL
  Filled 2018-02-03: qty 1

## 2018-02-03 MED ORDER — NALOXEGOL OXALATE 25 MG PO TABS: 25.0000 mg | ORAL_TABLET | Freq: Every day | ORAL | 1 refills | Status: DC | PRN

## 2018-02-03 MED ORDER — GABAPENTIN 300 MG PO CAPS: 300.0000 mg | ORAL_CAPSULE | Freq: Three times a day (TID) | ORAL | 0 refills | Status: DC

## 2018-02-03 MED ORDER — ACETAMINOPHEN 325 MG PO TABS: 650.0000 mg | ORAL_TABLET | Freq: Four times a day (QID) | ORAL | 0 refills | Status: DC | PRN

## 2018-02-03 MED ORDER — HYDROMORPHONE HCL 2 MG PO TABS: 2.0000 mg | ORAL_TABLET | Freq: Four times a day (QID) | ORAL | 0 refills | Status: DC | PRN

## 2018-02-03 MED ORDER — FLEET ENEMA 7-19 GM/118ML RE ENEM
1.0000 | ENEMA | Freq: Every day | RECTAL | Status: DC | PRN
  Administered 2018-02-03 (×2): 1 via RECTAL
  Filled 2018-02-03 (×2): qty 1

## 2018-02-03 MED ORDER — RIVAROXABAN 10 MG PO TABS
10.0000 mg | ORAL_TABLET | Freq: Every day | ORAL | 0 refills | Status: DC
Start: 2018-02-03 — End: 2018-05-27

## 2018-02-03 MED ORDER — METHOCARBAMOL 500 MG PO TABS: 500.0000 mg | ORAL_TABLET | Freq: Four times a day (QID) | ORAL | 0 refills | Status: DC | PRN

## 2018-02-03 NOTE — Clinical Social Work Placement (Addendum)
D/c Summary sent.  Nurse given number to call report.  PTAR arranged for transport.  CLINICAL SOCIAL WORK PLACEMENT  NOTE  Date:  02/03/2018  Patient Details  Name: Denise Fox MRN: [id number] Date of Birth: 09-09-1946  Clinical Social Work is seeking post-discharge placement for this patient at the [place] level of care (*CSW will initial, date and re-position this form in  chart as items are completed):  No   Patient/family provided with [facility] Work Department's list of facilities offering this level of care within the geographic area requested by the patient (or if unable, by the patient's family).  Yes   Patient/family informed of their freedom to choose among providers that offer the needed level of care, that participate in Medicare, Medicaid or managed care program needed by the patient, have an available bed and are willing to accept the patient.  No   Patient/family informed of [facility]'s ownership interest in [facility] and [facility], as well as of the fact that they are under no obligation to receive care at these facilities.  PASRR submitted to EDS on 01/30/18     PASRR number received on 01/30/18     Existing PASRR number confirmed on       FL2 transmitted to all facilities in geographic area requested by pt/family on       FL2 transmitted to all facilities within larger geographic area on       Patient informed that his/her managed care company has contracts with or will negotiate with certain facilities, including the following:  [facility]     No   Patient/family informed of bed offers received.  Patient chooses bed at [facility] recommends and patient chooses bed at      Patient to be transferred to [facility] on 02/03/18.  Patient to be transferred to facility by PTAR     Patient family notified on 02/03/18 of transfer.  Name of family member notified:   Patient to notify friends.      PHYSICIAN Please prepare priority discharge summary, including medications     Additional Comment:    _______________________________________________ Lia Hopping, LCSW 02/03/2018, 9:21 AM

## 2018-02-03 NOTE — Progress Notes (Signed)
   Subjective: 5 Days Post-Op Procedure(s) (LRB): TOTAL KNEE BILATERAL (Bilateral) Patient reports pain as mild.   Patient seen in rounds for Dr. Wynelle Link. Patient is well, but has had some minor complaints of pain in the knees, requiring pain medications. Slight low grade temp at night.  Had one bowel movement the other day and felt better temporarily. She has only had one movement so far.  Belly is a little sore. Will give suppository this morning. Plan for [place] today.  Objective: Vital signs in last 24 hours: Temp:  [98.9 F (37.2 C)-99.2 F (37.3 C)] 99.2 F (37.3 C) (03/11 0500) Pulse Rate:  [73-77] 73 (03/11 0500) Resp:  [17-18] 18 (03/11 0500) BP: (122-152)/(63-71) 152/66 (03/11 0500) SpO2:  [95 %-96 %] 95 % (03/11 0500)  Intake/Output from previous day:  Intake/Output Summary (Last 24 hours) at 02/03/2018 0711 Last data filed at 02/03/2018 0550 Gross per 24 hour  Intake 1380 ml  Output -  Net 1380 ml    Intake/Output this shift: No intake/output data recorded.  Labs: Recent Labs    02/01/18 0544  HGB 9.9*   Recent Labs    02/01/18 0544  WBC 11.2*  RBC 3.33*  HCT 30.1*  PLT 206   No results for input(s): NA, K, CL, CO2, BUN, CREATININE, GLUCOSE, CALCIUM in the last 72 hours. Recent Labs    01/31/18 1544 02/01/18 0544  INR 1.62 1.27    EXAM: General - Patient is Alert, Appropriate and Oriented Extremity - Neurovascular intact Sensation intact distally Intact pulses distally Dorsiflexion/Plantar flexion intact Incision - clean, dry, no drainage to both incisions.  A few small tape friction blisters on the right knee on distal incision. Motor Function - intact, moving foot and toes well on exam.   Assessment/Plan: 5 Days Post-Op Procedure(s) (LRB): TOTAL KNEE BILATERAL (Bilateral) Procedure(s) (LRB): TOTAL KNEE BILATERAL (Bilateral) Past Medical History:  Diagnosis Date  . Arthritis   . Complication of anesthesia    Increased heart rate,  and stops breathing in her sleep the 1st 24hrs after the procedure  . Complication of anesthesia    Sleep Apnea  . Hyperlipidemia   . Hypertension   . Myocardial infarction ([place]) 2001   no stents placed  . Prolapsing mitral valve   . Right bundle branch block   . Scoliosis    Principal Problem:   OA (osteoarthritis) of knee  Estimated body mass index is 30.65 kg/m as calculated from the following:   Height as of this encounter: 5\' 3"  (1.6 m).   Weight as of this encounter: 78.5 kg (173 lb). Up with therapy Diet - Cardiac diet Follow up - in 2 weeks from surgery (next week in the office) Activity - WBAT to both legs. Disposition - Skilled nursing facility - [facility] Condition Upon Discharge - Stable D/C Meds - See DC Summary DVT Prophylaxis - Xarelto for four weeks, then reduce to a baby 81 mg Aspirin daily for three additional weeks.  Arlee Muslim, PA-C Orthopaedic Surgery 02/03/2018, 7:11 AM

## 2018-02-03 NOTE — Care Management Important Message (Signed)
Important Message  Patient Details  Name: Denise Fox MRN: [id number] Date of Birth: [birth year]   Medicare Important Message Given:  Yes    Kerin Salen 02/03/2018, 11:19 AMImportant Message  Patient Details  Name: Denise Fox MRN: [id number] Date of Birth: 1946-09-07   Medicare Important Message Given:  Yes    Kerin Salen 02/03/2018, 11:19 AM

## 2018-02-03 NOTE — Progress Notes (Addendum)
Pt sitting on toilet.  Order for Dulcolax supp.  Stool noted at base of colon; attempted to disimpact pt but only able to obtain small amount.  Inserted supp while pt sitting on toilet. Will monitor pt closely and if pt not able to evacuate stool on toilet, staff will attempt to assist in bed.

## 2018-02-03 NOTE — Progress Notes (Signed)
Physical Therapy Treatment Patient Details Name: Denise Fox MRN: [id number] DOB: [birth year] Today's Date: 02/03/2018    History of Present Illness s/p bil TKAs    PT Comments    POD # 5 Applied both KI's and assisted OOB to amb to batyhroom.  Assisted on and off toilet.  Assisted back to bed to perfrom some TKR TE's followed by ICE.  Pt plans to D/C to SNF for ST Rehab.   Follow Up Recommendations  SNF     Equipment Recommendations  None recommended by PT    Recommendations for Other Services       Precautions / Restrictions Precautions Precautions: Fall;Knee Precaution Comments: pt still wearing both KI's Required Braces or Orthoses: Knee Immobilizer - Right;Knee Immobilizer - Left Knee Immobilizer - Right: Discontinue once straight leg raise with < 10 degree lag Knee Immobilizer - Left: Discontinue once straight leg raise with < 10 degree lag Restrictions Weight Bearing Restrictions: No Other Position/Activity Restrictions: WBAT    Mobility  Bed Mobility Overal bed mobility: Needs Assistance Bed Mobility: Sit to Supine;Supine to Sit Rolling: Min assist   Supine to sit: Min guard;Supervision Sit to supine: Min assist   General bed mobility comments: assist with LEs, positioning, cues for technique and safety   Transfers Overall transfer level: Needs assistance Equipment used: Rolling walker (2 wheeled) Transfers: Sit to/from Stand Sit to Stand: Min assist;+2 physical assistance;+2 safety/equipment         General transfer comment: cues for hand placement,  LE position and technique;   also assisted on/odd toilet  Ambulation/Gait   Ambulation Distance (Feet): 22 Feet(to and from bathroom only) Assistive device: Rolling walker (2 wheeled) Gait Pattern/deviations: Step-to pattern;Decreased step length - right;Decreased step length - left;Wide base of support Gait velocity: decreased   General Gait Details: cues for posture, RW safety, step length; bil KIs  in place, decreasing reliance on UEs, 2 standing breaks  decreased amb distance due to fatigue   Stairs            Wheelchair Mobility    Modified Rankin (Stroke Patients Only)       Balance                                            Cognition Arousal/Alertness: Awake/alert Behavior During Therapy: WFL for tasks assessed/performed Overall Cognitive Status: Within Functional Limits for tasks assessed                                 General Comments: highly motivated and feeling better about her progress   had a few bad days earlier      Exercises  B LE HS using bed sheet to assist 10 reps in supine  B LE ABd/ADd using bed sheet to assist 10 reps in supine    General Comments        Pertinent Vitals/Pain Pain Score: 8  Pain Location: thighs, bil knees Pain Descriptors / Indicators: Aching;Discomfort;Grimacing Pain Intervention(s): Monitored during session;Premedicated before session;Repositioned;Ice applied    Home Living                      Prior Function            PT Goals (current goals can now be found in the care plan section) Progress  towards PT goals: Progressing toward goals    Frequency    7X/week      PT Plan Current plan remains appropriate    Co-evaluation              AM-PAC PT "6 Clicks" Daily Activity  Outcome Measure    Difficulty moving from lying on back to sitting on the side of the bed? : A Little Difficulty sitting down on and standing up from a chair with arms (e.g., wheelchair, bedside commode, etc,.)?: A Little Help needed moving to and from a bed to chair (including a wheelchair)?: A Little Help needed walking in hospital room?: A Little Help needed climbing 3-5 steps with a railing? : A Little 6 Click Score: 15    End of Session Equipment Utilized During Treatment: Gait belt;Right knee immobilizer;Left knee immobilizer Activity Tolerance: Patient limited by  fatigue Patient left: in bed;with call bell/phone within reach;with bed alarm set;with family/visitor present Nurse Communication: Mobility status PT Visit Diagnosis: Difficulty in walking, not elsewhere classified (R26.2)     Time: 0601-5615 PT Time Calculation (min) (ACUTE ONLY): 28 min  Charges:  $Gait Training: 8-22 mins $Therapeutic Activity: 8-22 mins                    G Codes:       {Schae Cando  PTA [facility]  Acute  Rehab Pager      [phone]

## 2018-05-07 ENCOUNTER — Telehealth: Payer: Self-pay | Admitting: *Deleted

## 2018-05-07 ENCOUNTER — Inpatient Hospital Stay: Payer: Medicare Other | Admitting: Gynecologic Oncology

## 2018-05-07 NOTE — Telephone Encounter (Signed)
Returned the patient's call and rescheduled the patient's appt for today. Moved appt to July 19th

## 2018-05-19 ENCOUNTER — Telehealth: Payer: Self-pay | Admitting: *Deleted

## 2018-05-19 NOTE — Telephone Encounter (Signed)
Called and left a message that we cancelled the 7/19 appt due to Dr. Denman George not in the office. Ask the patient to call the office back to reschedule

## 2018-05-26 NOTE — Progress Notes (Signed)
Cardiology Office Note   Date:  05/27/2018   ID:  Michaelle Bottomley, DOB [birth year], MRN [id number]  PCP:  Lauraine Rinne, MD  Cardiologist:  Dr. Tamala Julian    Chief Complaint  Patient presents with  . Abnormal ECG    no chest pain      History of Present Illness: Denise Fox is a 72 y.o. female who presents for abnormal EKG.   She has a significant family history on both her mother and father's side of vascular cardiac and brain disease. 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 A nuclear study was done followed by her heart catheterization. At heart cath in 2001 there was no coronary disease.  Last nuc was 09/2016 and EF 70%, no wall motion abnormalities noted.  Low risk study.    She was diagnosed with uterine cancer and had total hysterectomy.       She was also seen by Dr. Audie Clear -nephrology for renal angiolipoma. She has seen [facility] and had EGD and colonoscopy at the end of Oct. Without problems neg for H. Pylori, or Barretts, chronic inflammation noted in stomach and esophagus.  Prilosec ordered.      10/29/17 hysterectomy with bil oopherectomy.   01/29/18 bilateral total knee arthroplasty Today she feels well.  She is exercising, first with PT after her knee surgery and now at [facility].  No chest pain and no SOB.  Her lipids have been elevated but she could not tolerate lipitor.  She is willing to try low dose of crestor.    Past Medical History:  Diagnosis Date  . Arthritis   . Complication of anesthesia    Increased heart rate, and stops breathing in her sleep the 1st 24hrs after the procedure  . Complication of anesthesia    Sleep Apnea  . Hyperlipidemia   . Hypertension   . Myocardial infarction ([place]) 2001   no stents placed  . Prolapsing mitral valve   . Right bundle branch block   . Scoliosis     Past Surgical History:  Procedure Laterality  Date  . KNEE SURGERY Bilateral 2007   [facility]  . ROBOTIC ASSISTED TOTAL HYSTERECTOMY WITH BILATERAL SALPINGO OOPHERECTOMY Bilateral 10/29/2017   Procedure: XI ROBOTIC ASSISTED TOTAL HYSTERECTOMY WITH BILATERAL SALPINGO OOPHORECTOMY WITH SENTINAL LYMPH NODES, pelvic lymphadenectomy, and left ureterolysis;  Surgeon: Everitt Amber, MD;  Location: [facility];  Service: Gynecology;  Laterality: Bilateral;  . TOTAL KNEE ARTHROPLASTY Bilateral 01/29/2018   Procedure: TOTAL KNEE BILATERAL;  Surgeon: Gaynelle Arabian, MD;  Location: [facility];  Service: Orthopedics;  Laterality: Bilateral;     Current Outpatient Medications  Medication Sig Dispense Refill  . acetaminophen (TYLENOL) 325 MG tablet Take 2 tablets (650 mg total) by mouth every 6 (six) hours as needed for mild pain. 60 tablet 0  . metoprolol succinate (TOPROL-XL) 25 MG 24 hr tablet Take 1 tablet (25 mg total) by mouth daily. 90 tablet 3  . olmesartan (BENICAR) 40 MG tablet Take 1 tablet (40 mg total) by mouth daily. 90 tablet 3  . [START ON 05/28/2018] rosuvastatin (CRESTOR) 5 MG tablet Take 1 tablet (5 mg total) by mouth every Monday, Wednesday, and Friday. 90 tablet 3   No current facility-administered medications for this visit.     Allergies:   Bee venom; Epinephrine; Atorvastatin; Atorvastatin calcium; Hydrocodone; Molds & smuts; Other; Oxycodone; Tramadol; Hydrochlorothiazide; and Lisinopril  Social History:  The patient  reports that she has never smoked. She has never used smokeless tobacco. She reports that she drinks alcohol. She reports that she does not use drugs.   Family History:  The patient's family history includes Cancer in her mother; Glaucoma in her sister; Heart disease in her father; Hypertension in her mother; Lung disease in her father; Thyroid disease in her mother.    ROS:  General:no colds or fevers, no weight changes Skin:no rashes or ulcers HEENT:no blurred vision, no congestion CV:see HPI PUL:see HPI GI:no  diarrhea constipation or melena, no indigestion GU:no hematuria, no dysuria MS:no joint pain, no claudication Neuro:no syncope, no lightheadedness Endo:no diabetes, no thyroid disease  Wt Readings from Last 3 Encounters:  05/27/18 175 lb 1.9 oz (79.4 kg)  01/29/18 173 lb (78.5 kg)  01/20/18 173 lb (78.5 kg)     PHYSICAL EXAM: VS:  BP 136/84   Pulse 62   Ht 5\' 3"  (1.6 m)   Wt 175 lb 1.9 oz (79.4 kg)   SpO2 96%   BMI 31.02 kg/m  , BMI Body mass index is 31.02 kg/m. General:Pleasant affect, NAD Skin:Warm and dry, brisk capillary refill HEENT:normocephalic, sclera clear, mucus membranes moist Neck:supple, no JVD, no bruits  Heart:S1S2 RRR without murmur, gallup, rub or click Lungs:clear without rales, rhonchi, or wheezes ZJI:RCVE, non tender, + BS, do not palpate liver spleen or masses Ext:no lower ext edema, 2+ pedal pulses, 2+ radial pulses Neuro:alert and oriented, MAE, follows commands, + facial symmetry    EKG:  EKG is NOT ordered today.    Recent Labs: 01/20/2018: ALT 22 01/31/2018: BUN 11; Creatinine, Ser 0.59; Potassium 3.8; Sodium 140 02/01/2018: Hemoglobin 9.9; Platelets 206    Lipid Panel    Component Value Date/Time   CHOL 222 (H) 05/27/2018 0951   TRIG 99 05/27/2018 0951   HDL 61 05/27/2018 0951   CHOLHDL 3.6 05/27/2018 0951   LDLCALC 141 (H) 05/27/2018 0951       Other studies Reviewed: Additional studies/ records that were reviewed today include: .  Stress nuc 10/01/16  Study Highlights     Nuclear stress EF: 70%. No wall motion abnormalities noted  There was no ST segment deviation noted during stress.  This is a low risk study. No perfusion defects at stress.      ASSESSMENT AND PLAN:  1.  HTN stable continue meds  2.  HLD will add Crestor 5 mg three times a week and recheck lipids in 6-8 weeks.    3.  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   Follow up with Dr. Tamala Julian in 6 months.    Current medicines are reviewed with the  patient today.  The patient Has no concerns regarding medicines.  The following changes have been made:  See above Labs/ tests ordered today include:see above  Disposition:   FU:  see above  Signed, Cecilie Kicks, NP  05/27/2018 5:20 PM    [address], [place], [place] Phone: [phone]; Fax: [phone]

## 2018-05-27 ENCOUNTER — Encounter: Payer: Self-pay | Admitting: Cardiology

## 2018-05-27 ENCOUNTER — Ambulatory Visit: Payer: Medicare Other | Admitting: Cardiology

## 2018-05-27 VITALS — BP 136/84 | HR 62 | Ht 63.0 in | Wt 175.1 lb

## 2018-05-27 DIAGNOSIS — I10 Essential (primary) hypertension: Secondary | ICD-10-CM | POA: Diagnosis not present

## 2018-05-27 DIAGNOSIS — R9431 Abnormal electrocardiogram [ECG] [EKG]: Secondary | ICD-10-CM

## 2018-05-27 DIAGNOSIS — E7849 Other hyperlipidemia: Secondary | ICD-10-CM | POA: Diagnosis not present

## 2018-05-27 LAB — LIPID PANEL
Chol/HDL Ratio: 3.6 ratio (ref 0.0–4.4)
Cholesterol, Total: 222 mg/dL — ABNORMAL HIGH (ref 100–199)
HDL: 61 mg/dL (ref >39)
LDL CALC: 141 mg/dL — AB (ref 0–99)
Triglycerides: 99 mg/dL (ref 0–149)
VLDL CHOLESTEROL CAL: 20 mg/dL (ref 5–40)

## 2018-05-27 MED ORDER — ROSUVASTATIN CALCIUM 5 MG PO TABS: 5.0000 mg | ORAL_TABLET | ORAL | 3 refills | Status: DC

## 2018-05-27 NOTE — Patient Instructions (Signed)
Medication Instructions:  1. START CRESTOR 5 MG THREE TIMES A WEEK.  Labwork: TODAY: LIPIDS  Your physician recommends that you return for lab work in: [place].   Testing/Procedures: None ordered  Follow-Up: Your physician wants you to follow-up in: 6 MONTHS with Dr. Tamala Julian  Any Other Special Instructions Will Be Listed Below (If Applicable).     If you need a refill on your cardiac medications before your next appointment, please call your pharmacy.

## 2018-06-13 ENCOUNTER — Ambulatory Visit: Payer: Medicare Other | Admitting: Gynecologic Oncology

## 2018-06-17 ENCOUNTER — Encounter: Payer: Self-pay | Admitting: Gynecologic Oncology

## 2018-06-17 ENCOUNTER — Inpatient Hospital Stay: Payer: Medicare Other | Attending: Gynecologic Oncology | Admitting: Gynecologic Oncology

## 2018-06-17 VITALS — BP 154/80 | HR 56 | Temp 98.2°F | Resp 18 | Ht 63.0 in | Wt 174.7 lb

## 2018-06-17 DIAGNOSIS — Z90722 Acquired absence of ovaries, bilateral: Secondary | ICD-10-CM | POA: Insufficient documentation

## 2018-06-17 DIAGNOSIS — C541 Malignant neoplasm of endometrium: Secondary | ICD-10-CM | POA: Diagnosis present

## 2018-06-17 DIAGNOSIS — Z90710 Acquired absence of both cervix and uterus: Secondary | ICD-10-CM | POA: Insufficient documentation

## 2018-06-17 NOTE — Progress Notes (Signed)
Consult Note: Gyn-Onc  Consult was requested by Dr. Leo Grosser for the evaluation of Denise Fox 72 y.o. female  CC:  Chief Complaint  Patient presents with  . Endometrial cancer [facility])    Assessment/Plan:  Denise Fox  is a 72 y.o.  year old with stage IA grade 1 endoemtrial cancer, s/p robotic staging on 10/29/17. Pathology revealed low risk factors for recurrence, therefore no adjuvant therapy was recommended according to NCCN guidelines.  I discussed risk for recurrence and typical symptoms encouraged her to notify us of these should they develop between visits.  I recommend she continue to have follow-up every 6 months for 5 years in accordance with NCCN guidelines. Those visits should include symptom assessment, physical exam and pelvic examination. Pap smears are not indicated or recommended in the routine surveillance of endometrial cancer.  Will see her back in 12 months, she will see Dr Leo Grosser in 6 months.   HPI: Denise Fox is a 72 year old woman who is seen in consultation at the request of Dr Leo Grosser for a thickened endometrium on MRI.  She was experiencing abdominal pains in August, 2018. This was worked up with an [place] by [employer] on 08/01/17 which showed non calcified gallstones and abnormal uterine contents.  She then followed up with Dr Leo Grosser on 08/21/17 who evaluated her with biopsy which expressed a large volume of endometrial fluid. Final pathology was indeterminant because it contained only mucoid material with abundant degenerating old red blood cells. No intact endometrial tissue was seen.  She was offered [facility] but declined this because she is nervous about anesthesia due to a history of postoperative apneic events.   Instead she elected for MRI of the pelvis which took place on 09/17/17 which showed a retroverted uterus with thickened endometrium and heterogeneously hypoenhancing. There were multiple uterine fibroids measuring <2cm. No free fluid or adenopathy was  seen.   Endometrial biopsy from our office on 10/07/17 showed grade 1 endometrioid endometrial cancer.  Preoperative biopsy showed endometrial cancer grade 1.   On `10/29/17 she underwent robotic hysterectomy, BSO, SLN biopsy and cul de sac biopsy. Intraoperatively nodularity was identified on the uterine serosa and pelvic peritoneum. 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 There was no extrauterine malignancy. The tumor was a grade 1 endometrioid tumor, 5.5cm, 0.3 of 1.5cm myometrial invasion, no LVSI and normal cervix, adnexa, and lymph nodes. It was staged as stage 1 A grade 1 endometrial cancer with low risk features for recurrence, therefore, in accordance with NCCN guidelines, no adjuvant therapy is recommended.  Interval Hx:  She had bilateral knee replacements in 2019 and did well after these. She has no symptoms concerning for recurrence.  Current Meds:  Outpatient Encounter Medications as of 06/17/2018  Medication Sig  . metoprolol succinate (TOPROL-XL) 25 MG 24 hr tablet Take 1 tablet (25 mg total) by mouth daily.  Marland Kitchen olmesartan (BENICAR) 40 MG tablet Take 1 tablet (40 mg total) by mouth daily.  . rosuvastatin (CRESTOR) 5 MG tablet Take 1 tablet (5 mg total) by mouth every Monday, Wednesday, and Friday.  . [DISCONTINUED] acetaminophen (TYLENOL) 325 MG tablet Take 2 tablets (650 mg total) by mouth every 6 (six) hours as needed for mild pain. (Patient not taking: Reported on 06/17/2018)   No facility-administered encounter medications on file as of 06/17/2018.     Allergy:  Allergies  Allergen Reactions  . Bee Venom Anaphylaxis  . Epinephrine Other (See Comments) and Hypertension  Elevated BP/shaking/sickness, including epi substitutes  Elevated BP/shaking/sickness.  . Atorvastatin Other (See Comments)    Body pain, loss of sleep  . Atorvastatin Calcium   . Hydrocodone Nausea Only  . Molds & Smuts Other (See Comments)    Severe  headache and tearing of the eyes   . Other Other (See Comments)    Perfume - headache Cigarettes-fainting  . Oxycodone Nausea Only  . Tramadol   . Hydrochlorothiazide Anxiety  . Lisinopril Anxiety    Social Hx:   Social History   Socioeconomic History  . Marital status: Widowed    Spouse name: Not on file  . Number of children: Not on file  . Years of education: Not on file  . Highest education level: Not on file  Occupational History  . Not on file  Social Needs  . Financial resource strain: Not on file  . Food insecurity:    Worry: Not on file    Inability: Not on file  . Transportation needs:    Medical: Not on file    Non-medical: Not on file  Tobacco Use  . Smoking status: Never Smoker  . Smokeless tobacco: Never Used  Substance and Sexual Activity  . Alcohol use: Yes    Comment: rare  . Drug use: No  . Sexual activity: Never  Lifestyle  . Physical activity:    Days per week: Not on file    Minutes per session: Not on file  . Stress: Not on file  Relationships  . Social connections:    Talks on phone: Not on file    Gets together: Not on file    Attends religious service: Not on file    Active member of club or organization: Not on file    Attends meetings of clubs or organizations: Not on file    Relationship status: Not on file  . Intimate partner violence:    Fear of current or ex partner: Not on file    Emotionally abused: Not on file    Physically abused: Not on file    Forced sexual activity: Not on file  Other Topics Concern  . Not on file  Social History Narrative  . Not on file    Past Surgical Hx:  Past Surgical History:  Procedure Laterality Date  . KNEE SURGERY Bilateral 2007   Mensicus Repair  . ROBOTIC ASSISTED TOTAL HYSTERECTOMY WITH BILATERAL SALPINGO OOPHERECTOMY Bilateral 10/29/2017   Procedure: XI ROBOTIC ASSISTED TOTAL HYSTERECTOMY WITH BILATERAL SALPINGO OOPHORECTOMY WITH SENTINAL LYMPH NODES, pelvic lymphadenectomy, and  left ureterolysis;  Surgeon: Everitt Amber, MD;  Location: [facility];  Service: Gynecology;  Laterality: Bilateral;  . TOTAL KNEE ARTHROPLASTY Bilateral 01/29/2018   Procedure: TOTAL KNEE BILATERAL;  Surgeon: Gaynelle Arabian, MD;  Location: [facility];  Service: Orthopedics;  Laterality: Bilateral;    Past Medical Hx:  Past Medical History:  Diagnosis Date  . Arthritis   . Complication of anesthesia    Increased heart rate, and stops breathing in her sleep the 1st 24hrs after the procedure  . Complication of anesthesia    Sleep Apnea  . Hyperlipidemia   . Hypertension   . Myocardial infarction ([place]) 2001   no stents placed  . Prolapsing mitral valve   . Right bundle branch block   . Scoliosis     Past Gynecological History:  SVDx 1 No LMP recorded. Patient is postmenopausal.  Family Hx:  Family History  Problem Relation Age of Onset  . Cancer Mother  bladder / ureater  . Hypertension Mother   . Thyroid disease Mother   . Lung disease Father   . Heart disease Father   . Glaucoma Sister     Review of Systems:  Constitutional  Feels well,    ENT Normal appearing ears and nares bilaterally Skin/Breast  No rash, sores, jaundice, itching, dryness Cardiovascular  No chest pain, shortness of breath, or edema  Pulmonary  No cough or wheeze.  Gastro Intestinal  No nausea, vomitting, or diarrhoea. No bright red blood per rectum, no abdominal pain, change in bowel movement, or constipation.  Genito Urinary  + dysuria, no bleeding Musculo Skeletal  No myalgia, arthralgia, joint swelling or pain  Neurologic  No weakness, numbness, change in gait,  Psychology  No depression, anxiety, insomnia.   Vitals:  Blood pressure (!) 154/80, pulse (!) 56, temperature 98.2 F (36.8 C), temperature source Oral, resp. rate 18, height 5\' 3"  (1.6 m), weight 174 lb 11.2 oz (79.2 kg), SpO2 98 %.  Physical Exam: WD in NAD Neck  Supple NROM, without any enlargements.  Lymph Node Survey No  cervical supraclavicular or inguinal adenopathy Cardiovascular  Pulse normal rate, regularity and rhythm. S1 and S2 normal.  Lungs  Clear to auscultation bilateraly, without wheezes/crackles/rhonchi. Good air movement.  Skin  No rash/lesions/breakdown  Psychiatry  Alert and oriented to person, place, and time  Abdomen  Normoactive bowel sounds, abdomen soft, non-tender and nonobese without evidence of hernia. Well healed incisions. Back No CVA tenderness Genito Urinary: well healed cuff, no bleeding, no masses Rectal  deferred Extremities  No bilateral cyanosis, clubbing or edema. Thereasa Solo, MD  06/17/2018, 12:12 PM

## 2018-06-17 NOTE — Patient Instructions (Signed)
Please notify Dr Denman George at phone number [phone] if you notice vaginal bleeding, new pelvic or abdominal pains, bloating, feeling full easy, or a change in bladder or bowel function.   Please return to see Dr Denman George in July, 2020. Please return to see Dr Leo Grosser in January, 2020.

## 2018-07-08 ENCOUNTER — Other Ambulatory Visit: Payer: Medicare Other

## 2018-07-08 DIAGNOSIS — E7849 Other hyperlipidemia: Secondary | ICD-10-CM

## 2018-07-08 LAB — LIPID PANEL
Chol/HDL Ratio: 2.7 ratio (ref 0.0–4.4)
Cholesterol, Total: 170 mg/dL (ref 100–199)
HDL: 62 mg/dL (ref >39)
LDL CALC: 93 mg/dL (ref 0–99)
TRIGLYCERIDES: 73 mg/dL (ref 0–149)
VLDL CHOLESTEROL CAL: 15 mg/dL (ref 5–40)

## 2018-07-08 LAB — HEPATIC FUNCTION PANEL
ALT: 19 IU/L (ref 0–32)
AST: 19 IU/L (ref 0–40)
Albumin: 4.2 g/dL (ref 3.5–4.8)
Alkaline Phosphatase: 104 IU/L (ref 39–117)
Bilirubin Total: 0.4 mg/dL (ref 0.0–1.2)
Bilirubin, Direct: 0.1 mg/dL (ref 0.00–0.40)
Total Protein: 6.9 g/dL (ref 6.0–8.5)

## 2018-07-09 ENCOUNTER — Telehealth: Payer: Self-pay

## 2018-07-09 NOTE — Telephone Encounter (Signed)
Notes recorded by Frederik Schmidt, RN on 07/09/2018 at 10:41 AM EDT [id number] 8/14 ------

## 2018-07-09 NOTE — Telephone Encounter (Signed)
-----   Message from Charlie Pitter, [place] sent at 07/09/2018 10:05 AM EDT ----- I am covering Laura's inbox 8/14-8/15. Please let patient know lipids have improved significantly on low dose Crestor. Given that she does not carry any atherosclerotic diagnoses, her lipids are now considered normal. Would encourage her to continue, and make sure to work on healthy weight and diet. LFTs are normal.  Melina Copa PA-C

## 2018-08-12 ENCOUNTER — Encounter: Payer: Self-pay | Admitting: Physician Assistant

## 2018-08-12 ENCOUNTER — Ambulatory Visit: Payer: Medicare Other | Admitting: Physician Assistant

## 2018-08-12 VITALS — BP 136/80 | HR 59 | Ht 63.0 in | Wt 177.4 lb

## 2018-08-12 DIAGNOSIS — I10 Essential (primary) hypertension: Secondary | ICD-10-CM

## 2018-08-12 DIAGNOSIS — R9431 Abnormal electrocardiogram [ECG] [EKG]: Secondary | ICD-10-CM

## 2018-08-12 DIAGNOSIS — E782 Mixed hyperlipidemia: Secondary | ICD-10-CM | POA: Diagnosis not present

## 2018-08-12 MED ORDER — METOPROLOL SUCCINATE ER 25 MG PO TB24: 25.0000 mg | ORAL_TABLET | Freq: Every day | ORAL | 3 refills | Status: DC

## 2018-08-12 MED ORDER — ROSUVASTATIN CALCIUM 5 MG PO TABS: 5.0000 mg | ORAL_TABLET | ORAL | 3 refills | Status: DC

## 2018-08-12 MED ORDER — OLMESARTAN MEDOXOMIL 40 MG PO TABS: 40.0000 mg | ORAL_TABLET | Freq: Every day | ORAL | 3 refills | Status: DC

## 2018-08-12 MED ORDER — ASPIRIN EC 81 MG PO TBEC: 81.0000 mg | DELAYED_RELEASE_TABLET | Freq: Every day | ORAL | 3 refills | Status: AC

## 2018-08-12 NOTE — Progress Notes (Signed)
Cardiology Office Note    Date:  08/12/2018   ID:  Denise Fox, DOB [birth year], MRN [id number]  PCP:  Lauraine Rinne, MD  Cardiologist: Sinclair Grooms, MD EPS: None  Chief Complaint  Patient presents with  . Follow-up    History of Present Illness:  Denise Fox is a 72 y.o. female with history of abnormal EKG with left anterior hemiblock and incomplete right bundle branch block.  Nuclear stress test 2017 low risk study normal LVEF 70%, strong family history of CAD, hypertension, HLD.  Patient comes in today because she was told her medications couldn't be filled without an office visit even though she was here in July 2019. She drove from [facility]. I offered to cancel her visit but she was very kind and wanted to go ahead with it. 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 No chest pain or cardiac complaints.  Recent LDL was 93 which is improved from 141 on Crestor.  Past Medical History:  Diagnosis Date  . Arthritis   . Complication of anesthesia    Increased heart rate, and stops breathing in her sleep the 1st 24hrs after the procedure  . Complication of anesthesia    Sleep Apnea  . Hyperlipidemia   . Hypertension   . Myocardial infarction ([place]) 2001   no stents placed  . Prolapsing mitral valve   . Right bundle branch block   . Scoliosis     Past Surgical History:  Procedure Laterality Date  . KNEE SURGERY Bilateral 2007   Mensicus Repair  . ROBOTIC ASSISTED TOTAL HYSTERECTOMY WITH BILATERAL SALPINGO OOPHERECTOMY Bilateral 10/29/2017   Procedure: XI ROBOTIC ASSISTED TOTAL HYSTERECTOMY WITH BILATERAL SALPINGO OOPHORECTOMY WITH SENTINAL LYMPH NODES, pelvic lymphadenectomy, and left ureterolysis;  Surgeon: Everitt Amber, MD;  Location: [facility];  Service: Gynecology;  Laterality: Bilateral;  . TOTAL KNEE ARTHROPLASTY Bilateral 01/29/2018   Procedure: TOTAL KNEE BILATERAL;   Surgeon: Gaynelle Arabian, MD;  Location: [facility];  Service: Orthopedics;  Laterality: Bilateral;    Current Medications: Current Meds  Medication Sig  . metoprolol succinate (TOPROL-XL) 25 MG 24 hr tablet Take 1 tablet (25 mg total) by mouth daily.  Marland Kitchen olmesartan (BENICAR) 40 MG tablet Take 1 tablet (40 mg total) by mouth daily.  Derrill Memo ON 08/13/2018] rosuvastatin (CRESTOR) 5 MG tablet Take 1 tablet (5 mg total) by mouth every Monday, Wednesday, and Friday.  . [DISCONTINUED] metoprolol succinate (TOPROL-XL) 25 MG 24 hr tablet Take 1 tablet (25 mg total) by mouth daily.  . [DISCONTINUED] olmesartan (BENICAR) 40 MG tablet Take 1 tablet (40 mg total) by mouth daily.  . [DISCONTINUED] rosuvastatin (CRESTOR) 5 MG tablet Take 1 tablet (5 mg total) by mouth every Monday, Wednesday, and Friday.     Allergies:   Bee venom; Epinephrine; Atorvastatin; Atorvastatin calcium; Hydrocodone; Molds & smuts; Other; Oxycodone; Tramadol; Hydrochlorothiazide; and Lisinopril   Social History   Socioeconomic History  . Marital status: Widowed    Spouse name: Not on file  . Number of children: Not on file  . Years of education: Not on file  . Highest education level: Not on file  Occupational History  . Not on file  Social Needs  . Financial resource strain: Not on file  . Food insecurity:    Worry: Not on file    Inability: Not on file  . Transportation needs:    Medical: Not  on file    Non-medical: Not on file  Tobacco Use  . Smoking status: Never Smoker  . Smokeless tobacco: Never Used  Substance and Sexual Activity  . Alcohol use: Yes    Comment: rare  . Drug use: No  . Sexual activity: Never  Lifestyle  . Physical activity:    Days per week: Not on file    Minutes per session: Not on file  . Stress: Not on file  Relationships  . Social connections:    Talks on phone: Not on file    Gets together: Not on file    Attends religious service: Not on file    Active member of club or  organization: Not on file    Attends meetings of clubs or organizations: Not on file    Relationship status: Not on file  Other Topics Concern  . Not on file  Social History Narrative  . Not on file     Family History:  The patient's family history includes Cancer in her mother; Glaucoma in her sister; Heart disease in her father; Hypertension in her mother; Lung disease in her father; Thyroid disease in her mother.   ROS:   Please see the history of present illness.    Review of Systems  Constitution: Negative.  HENT: Negative.   Eyes: Negative.   Cardiovascular: Negative.   Respiratory: Negative.   Hematologic/Lymphatic: Negative.   Musculoskeletal: Negative.  Negative for joint pain.  Gastrointestinal: Negative.   Genitourinary: Negative.   Neurological: Negative.    All other systems reviewed and are negative.   PHYSICAL EXAM:   VS:  BP 136/80   Pulse (!) 59   Ht 5\' 3"  (1.6 m)   Wt 177 lb 6.4 oz (80.5 kg)   BMI 31.42 kg/m   Physical Exam  GEN: Well nourished, well developed, in no acute distress  Neck: no JVD, carotid bruits, or masses Cardiac:RRR; no murmurs, rubs, or gallops  Respiratory:  clear to auscultation bilaterally, normal work of breathing GI: soft, nontender, nondistended, + BS Ext: without cyanosis, clubbing, or edema, Good distal pulses bilaterally Neuro:  Alert and Oriented x 3 Psych: euthymic mood, full affect  Wt Readings from Last 3 Encounters:  08/12/18 177 lb 6.4 oz (80.5 kg)  06/17/18 174 lb 11.2 oz (79.2 kg)  05/27/18 175 lb 1.9 oz (79.4 kg)      Studies/Labs Reviewed:   EKG:  EKG is  ordered today.  The ekg ordered today demonstrates sinus bradycardia 59 bpm with left anterior fascicular block and nonspecific ST-T wave changes, no acute change from prior tracing  Recent Labs: 01/31/2018: BUN 11; Creatinine, Ser 0.59; Potassium 3.8; Sodium 140 02/01/2018: Hemoglobin 9.9; Platelets 206 07/08/2018: ALT 19   Lipid Panel    Component  Value Date/Time   CHOL 170 07/08/2018 0744   TRIG 73 07/08/2018 0744   HDL 62 07/08/2018 0744   CHOLHDL 2.7 07/08/2018 0744   LDLCALC 93 07/08/2018 0744    Additional studies/ records that were reviewed today include:  Nuclear stress test 2017Study Highlights      Nuclear stress EF: 70%. No wall motion abnormalities noted  There was no ST segment deviation noted during stress.  This is a low risk study. No perfusion defects at stress.   Candee Furbish, MD         ASSESSMENT:    1. Essential hypertension, benign   2. Mixed hyperlipidemia   3. Abnormal EKG      PLAN:  In order of problems listed above:  Essential hypertension well-controlled on metoprolol and olmesartan  Hyperlipidemia LDL came down to 93 on low-dose Crestor.  Continue this dose.  Abnormal EKG unchanged from prior tracing.  Normal nuclear stress test 2017.  Asymptomatic.    Medication Adjustments/Labs and Tests Ordered: Current medicines are reviewed at length with the patient today.  Concerns regarding medicines are outlined above.  Medication changes, Labs and Tests ordered today are listed in the Patient Instructions below. Patient Instructions  Medication Instructions:  Your physician has recommended you make the following change in your medication:   START: aspirin 81 mg once daily  Labwork: None ordered  Testing/Procedures: None ordered  Follow-Up: Keep follow-up appointment with Dr. Tamala Julian on 10/28/18 at 9:00 AM   Any Other Special Instructions Will Be Listed Below (If Applicable).     If you need a refill on your cardiac medications before your next appointment, please call your pharmacy.      Signed, Ermalinda Barrios, PA-C  08/12/2018 2:35 PM    [facility] [address], [place], [place]  [address] Phone: [phone]; Fax: [phone]

## 2018-08-12 NOTE — Patient Instructions (Addendum)
Medication Instructions:  Your physician has recommended you make the following change in your medication:   START: aspirin 81 mg once daily  Labwork: None ordered  Testing/Procedures: None ordered  Follow-Up: Keep follow-up appointment with Dr. Tamala Julian on 10/28/18 at 9:00 AM   Any Other Special Instructions Will Be Listed Below (If Applicable).     If you need a refill on your cardiac medications before your next appointment, please call your pharmacy.

## 2018-08-12 NOTE — Addendum Note (Signed)
Addended by: Imogene Burn on: 08/12/2018 02:38 PM   Modules accepted: Level of Service

## 2018-09-12 ENCOUNTER — Telehealth: Payer: Self-pay

## 2018-09-12 NOTE — Telephone Encounter (Signed)
Pt cc of abdominal pain x 3 weeks. Pain is 1-4/10. Occasionally nauseous. Most pain is felt above and to the right of her umbilicus. Urinary incontinence increasing. BM well. No medication for pain needed. She wants to r/o cancer reoccurrence before looking into gallbladder or diverticulitis. Reviewed with Joylene John, NP. Scheduled patient for visit with Dr. Denman George on Tuesday 09-16-18 at 3 pm.

## 2018-09-16 ENCOUNTER — Inpatient Hospital Stay: Payer: Medicare Other

## 2018-09-16 ENCOUNTER — Inpatient Hospital Stay: Payer: Medicare Other | Attending: Gynecologic Oncology | Admitting: Gynecologic Oncology

## 2018-09-16 ENCOUNTER — Encounter: Payer: Self-pay | Admitting: Gynecologic Oncology

## 2018-09-16 VITALS — BP 148/82 | HR 62 | Temp 97.6°F | Resp 18 | Ht 63.0 in | Wt 174.0 lb

## 2018-09-16 DIAGNOSIS — Z90710 Acquired absence of both cervix and uterus: Secondary | ICD-10-CM | POA: Diagnosis not present

## 2018-09-16 DIAGNOSIS — C541 Malignant neoplasm of endometrium: Secondary | ICD-10-CM

## 2018-09-16 DIAGNOSIS — Z90722 Acquired absence of ovaries, bilateral: Secondary | ICD-10-CM

## 2018-09-16 DIAGNOSIS — R109 Unspecified abdominal pain: Secondary | ICD-10-CM

## 2018-09-16 LAB — BASIC METABOLIC PANEL
ANION GAP: 9 (ref 5–15)
BUN: 18 mg/dL (ref 8–23)
CHLORIDE: 104 mmol/L (ref 98–111)
CO2: 28 mmol/L (ref 22–32)
Calcium: 10.4 mg/dL — ABNORMAL HIGH (ref 8.9–10.3)
Creatinine, Ser: 0.86 mg/dL (ref 0.44–1.00)
GFR calc Af Amer: >60 mL/min (ref >60)
GLUCOSE: 96 mg/dL (ref 70–99)
POTASSIUM: 4.5 mmol/L (ref 3.5–5.1)
SODIUM: 141 mmol/L (ref 135–145)

## 2018-09-16 NOTE — Progress Notes (Signed)
Follow-up Note: Gyn-Onc  Consult was requested by Dr. Leo Grosser for the evaluation of Denise Fox 72 y.o. female  CC:  Chief Complaint  Patient presents with  . Endometrial cancer [facility])    Assessment/Plan:  Denise Fox  is a 72 y.o.  year old with history of stage IA grade 1 endoemtrial cancer, s/p robotic staging on 10/29/17. Pathology revealed low risk factors for recurrence, therefore no adjuvant therapy was recommended according to NCCN guidelines.  1/ New abdominal pain - work up with CA 125 and CT scan to evaluate for occult peritoneal or abdominal wall recurrence.  2/ endometrial cancer follow-up -  I discussed risk for recurrence and typical symptoms encouraged her to notify us of these should they develop between visits.  I recommend she continue to have follow-up every 6 months for 5 years in accordance with NCCN guidelines. Those visits should include symptom assessment, physical exam and pelvic examination. Pap smears are not indicated or recommended in the routine surveillance of endometrial cancer.  Will see her back in July, 2020, she will see Dr Leo Grosser in January.   HPI: Denise Fox is a 72 year old woman who is seen in consultation at the request of Dr Leo Grosser for a thickened endometrium on MRI.  She was experiencing abdominal pains in August, 2018. This was worked up with an [place] by [employer] on 08/01/17 which showed non calcified gallstones and abnormal uterine contents.  She then followed up with Dr Leo Grosser on 08/21/17 who evaluated her with biopsy which expressed a large volume of endometrial fluid. Final pathology was indeterminant because it contained only mucoid material with abundant degenerating old red blood cells. No intact endometrial tissue was seen.  She was offered [facility] but declined this because she is nervous about anesthesia due to a history of postoperative apneic events.   Instead she elected for MRI of the pelvis which took place on 09/17/17 which showed  a retroverted uterus with thickened endometrium and heterogeneously hypoenhancing. There were multiple uterine fibroids measuring <2cm. No free fluid or adenopathy was seen.   Endometrial biopsy from our office on 10/07/17 showed grade 1 endometrioid endometrial cancer.  Preoperative biopsy showed endometrial cancer grade 1.   On `10/29/17 she underwent robotic hysterectomy, BSO, SLN biopsy and cul de sac biopsy. Intraoperatively nodularity was identified on the uterine serosa and pelvic peritoneum. 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 There was no extrauterine malignancy. The tumor was a grade 1 endometrioid tumor, 5.5cm, 0.3 of 1.5cm myometrial invasion, no LVSI and normal cervix, adnexa, and lymph nodes. It was staged as stage 1 A grade 1 endometrial cancer with low risk features for recurrence, therefore, in accordance with NCCN guidelines, no adjuvant therapy is recommended.  Interval Hx:   She has had symptoms of migratory abdominal pains for 3 weeks, not associated with any activity or position or eating.   Current Meds:  Outpatient Encounter Medications as of 09/16/2018  Medication Sig  . aspirin EC 81 MG tablet Take 1 tablet (81 mg total) by mouth daily.  . metoprolol succinate (TOPROL-XL) 25 MG 24 hr tablet Take 1 tablet (25 mg total) by mouth daily.  Marland Kitchen olmesartan (BENICAR) 40 MG tablet Take 1 tablet (40 mg total) by mouth daily.  . rosuvastatin (CRESTOR) 5 MG tablet Take 1 tablet (5 mg total) by mouth every Monday, Wednesday, and Friday.   No facility-administered encounter medications on file as of 09/16/2018.     Allergy:  Allergies  Allergen Reactions  . Bee Venom Anaphylaxis  . Epinephrine Other (See Comments) and Hypertension    Elevated BP/shaking/sickness, including epi substitutes  Elevated BP/shaking/sickness.  . Atorvastatin Other (See Comments)    Body pain, loss of sleep  . Atorvastatin Calcium   . Hydrocodone  Nausea Only  . Molds & Smuts Other (See Comments)    Severe headache and tearing of the eyes   . Other Other (See Comments)    Perfume - headache Cigarettes-fainting Headache,tearing of the eye  . Oxycodone Nausea Only  . Tramadol   . Hydrochlorothiazide Anxiety  . Lisinopril Anxiety    Social Hx:   Social History   Socioeconomic History  . Marital status: Widowed    Spouse name: Not on file  . Number of children: Not on file  . Years of education: Not on file  . Highest education level: Not on file  Occupational History  . Not on file  Social Needs  . Financial resource strain: Not on file  . Food insecurity:    Worry: Not on file    Inability: Not on file  . Transportation needs:    Medical: Not on file    Non-medical: Not on file  Tobacco Use  . Smoking status: Never Smoker  . Smokeless tobacco: Never Used  Substance and Sexual Activity  . Alcohol use: Yes    Comment: rare  . Drug use: No  . Sexual activity: Never  Lifestyle  . Physical activity:    Days per week: Not on file    Minutes per session: Not on file  . Stress: Not on file  Relationships  . Social connections:    Talks on phone: Not on file    Gets together: Not on file    Attends religious service: Not on file    Active member of club or organization: Not on file    Attends meetings of clubs or organizations: Not on file    Relationship status: Not on file  . Intimate partner violence:    Fear of current or ex partner: Not on file    Emotionally abused: Not on file    Physically abused: Not on file    Forced sexual activity: Not on file  Other Topics Concern  . Not on file  Social History Narrative  . Not on file    Past Surgical Hx:  Past Surgical History:  Procedure Laterality Date  . KNEE SURGERY Bilateral 2007   Mensicus Repair  . ROBOTIC ASSISTED TOTAL HYSTERECTOMY WITH BILATERAL SALPINGO OOPHERECTOMY Bilateral 10/29/2017   Procedure: XI ROBOTIC ASSISTED TOTAL HYSTERECTOMY WITH  BILATERAL SALPINGO OOPHORECTOMY WITH SENTINAL LYMPH NODES, pelvic lymphadenectomy, and left ureterolysis;  Surgeon: Everitt Amber, MD;  Location: [facility];  Service: Gynecology;  Laterality: Bilateral;  . TOTAL KNEE ARTHROPLASTY Bilateral 01/29/2018   Procedure: TOTAL KNEE BILATERAL;  Surgeon: Gaynelle Arabian, MD;  Location: [facility];  Service: Orthopedics;  Laterality: Bilateral;    Past Medical Hx:  Past Medical History:  Diagnosis Date  . Arthritis   . Complication of anesthesia    Increased heart rate, and stops breathing in her sleep the 1st 24hrs after the procedure  . Complication of anesthesia    Sleep Apnea  . Hyperlipidemia   . Hypertension   . Myocardial infarction ([place]) 2001   no stents placed  . Prolapsing mitral valve   . Right bundle branch block   . Scoliosis     Past Gynecological History:  SVDx  1 No LMP recorded. Patient is postmenopausal.  Family Hx:  Family History  Problem Relation Age of Onset  . Cancer Mother        bladder / ureater  . Hypertension Mother   . Thyroid disease Mother   . Lung disease Father   . Heart disease Father   . Glaucoma Sister     Review of Systems:  Constitutional  Feels well,    ENT Normal appearing ears and nares bilaterally Skin/Breast  No rash, sores, jaundice, itching, dryness Cardiovascular  No chest pain, shortness of breath, or edema  Pulmonary  No cough or wheeze.  Gastro Intestinal  No nausea, vomitting, or diarrhoea. No bright red blood per rectum, change in bowel movement, or constipation. + abdominal pains Genito Urinary  + dysuria, no bleeding Musculo Skeletal  No myalgia, arthralgia, joint swelling or pain  Neurologic  No weakness, numbness, change in gait,  Psychology  No depression, anxiety, insomnia.   Vitals:  Blood pressure (!) 148/82, pulse 62, temperature 97.6 F (36.4 C), temperature source Oral, resp. rate 18, height 5\' 3"  (1.6 m), weight 174 lb (78.9 kg), SpO2 100 %.  Physical Exam: WD in  NAD Neck  Supple NROM, without any enlargements.  Lymph Node Survey No cervical supraclavicular or inguinal adenopathy Cardiovascular  Pulse normal rate, regularity and rhythm. S1 and S2 normal.  Lungs  Clear to auscultation bilateraly, without wheezes/crackles/rhonchi. Good air movement.  Skin  No rash/lesions/breakdown  Psychiatry  Alert and oriented to person, place, and time  Abdomen  Normoactive bowel sounds, abdomen soft, non-tender and nonobese without evidence of hernia. Well healed incisions. The areas marked with an x representing pain are soft without palpable masses, though some tenderness to palpation. Back No CVA tenderness Genito Urinary: In tact, soft, tenderness to deep palpation, no bleeding, no masses Rectal  deferred Extremities  No bilateral cyanosis, clubbing or edema. Thereasa Solo, MD  09/16/2018, 3:50 PM

## 2018-09-16 NOTE — Patient Instructions (Signed)
Dr Denman George has ordered labs and a CT scan to evaluate the pain. She does not find evidence of a source for the pain on examination.  She will contact you with the scan results.  If these are normal, the plan will be to follow-up with her in July, 2020.

## 2018-09-17 LAB — CA 125: Cancer Antigen (CA) 125: 7.2 U/mL (ref 0.0–38.1)

## 2018-09-18 ENCOUNTER — Telehealth: Payer: Self-pay

## 2018-09-18 ENCOUNTER — Ambulatory Visit (HOSPITAL_COMMUNITY)
Admission: RE | Admit: 2018-09-18 | Discharge: 2018-09-18 | Disposition: A | Discharge status: Home / Self Care | Payer: Medicare Other | Source: Ambulatory Visit | Attending: Gynecologic Oncology | Admitting: Gynecologic Oncology

## 2018-09-18 DIAGNOSIS — K5730 Diverticulosis of large intestine without perforation or abscess without bleeding: Secondary | ICD-10-CM | POA: Insufficient documentation

## 2018-09-18 DIAGNOSIS — C541 Malignant neoplasm of endometrium: Secondary | ICD-10-CM | POA: Insufficient documentation

## 2018-09-18 DIAGNOSIS — K8020 Calculus of gallbladder without cholecystitis without obstruction: Secondary | ICD-10-CM | POA: Insufficient documentation

## 2018-09-18 MED ORDER — IOHEXOL 300 MG/ML  SOLN
100.0000 mL | Freq: Once | INTRAMUSCULAR | Status: AC | PRN
  Administered 2018-09-18: 100 mL via INTRAVENOUS

## 2018-09-18 MED ORDER — SODIUM CHLORIDE 0.9 % IJ SOLN
INTRAMUSCULAR | Status: AC
  Filled 2018-09-18: qty 50

## 2018-09-18 NOTE — Telephone Encounter (Signed)
Outgoing call to patient per Joylene John NP  Regarding CA 125 results 7.2 as "normal, stable" - pt voiced understanding. Pt asked if CT results back - which they are not- I let her know that once the physician reviews report then we will call her.  Pt voiced understanding. No other needs per pt at this time.

## 2018-09-18 NOTE — Telephone Encounter (Signed)
Outgoing call per Joylene John NP regarding CA 125 is "normal, stable".  No answer, no option to leave a VM.  Will attempt at another time.

## 2018-09-19 ENCOUNTER — Encounter: Payer: Self-pay | Admitting: Oncology

## 2018-09-19 ENCOUNTER — Telehealth: Payer: Self-pay | Admitting: *Deleted

## 2018-09-19 NOTE — Progress Notes (Signed)
Faxed referral to [facility]. 

## 2018-09-19 NOTE — Telephone Encounter (Signed)
Called to inform patient of her CT Abdomen and Pelvis with Contrast results from 09/18/18.  Per Joylene John, NP the results are as followed:  No evidence of cancer seen, gallstones bu no inflammation related to this, diverticulosis (pockets or out pouching from the colon wall that can happen with age) with no evidence of infection or inflammation.  Patient verbalized understanding.  Patient also requested a referral from Dr. Denman George to see about her gallbladder.  I told the patient that I will check with Dr. Denman George when she returns to clinic and our office will give her a call back.  Patient was very appreciative and verbalized understanding.

## 2018-09-22 ENCOUNTER — Telehealth: Payer: Self-pay | Admitting: Oncology

## 2018-09-22 NOTE — Telephone Encounter (Signed)
Sarah from [facility] called and said patient has appointment scheduled on 10/16/18 at 9:20 am with Dr. Rosendo Gros.

## 2018-10-16 ENCOUNTER — Ambulatory Visit: Payer: Self-pay | Admitting: General Surgery

## 2018-10-16 NOTE — H&P (Signed)
History of Present Illness Ralene Ok MD; 10/16/2018 10:02 AM) The patient is a 72 year old female who presents for evaluation of gall stones. Referred by: Dr. Denman George Chief Complaint: Abdominal pain, gallstones 72 year old female with a history of previous MIs, mitral valve prolapse, hypertension, endometrial cancer who comes in with a history of gallstones. Patient states that she's had approximately 1 year history of abdominal pain that is originating periumbilically and radiated to the right upper quadrant area. She states that this usually associated with some nausea however no emesis. States that she's been cutting back on spicy foods and fatty foods and feels that the episodes of pain have decreased.  Patient's had previous ultrasound, MRI, CT scan which did reveal cholelithiasis. I did review this personally.  Patient's other previous robotic total hysterectomy by Dr. Harrington Challenger in the past.  Patient sees Dr. Daneen Schick her cardiologist.    Past Surgical History Emeline Gins, [place]; 10/16/2018 9:30 AM) Hysterectomy (due to cancer) - Complete  Knee Surgery  Bilateral. Oral Surgery  Tonsillectomy   Diagnostic Studies History Emeline Gins, [place]; 10/16/2018 9:30 AM) Colonoscopy  within last year Mammogram  within last year Pap Smear  >5 years ago  Allergies Emeline Gins, [place]; 10/16/2018 9:33 AM) EPINEPHrine *Vasopressors  Codeine and Related  Allergies Reconciled   Medication History Emeline Gins, CMA; 10/16/2018 9:32 AM) Metoprolol Succinate ER (25MG  Tablet ER 24HR, Oral) Active. Rosuvastatin Calcium (5MG  Tablet, Oral) Active. Prevnar 13 (Intramuscular) Active. Olmesartan Medoxomil (40MG  Tablet, Oral) Active. Medications Reconciled  Social History Emeline Gins, [place]; 10/16/2018 9:30 AM) Alcohol use  Recently quit alcohol use. Illicit drug use  Remotely quit drug use. No caffeine use  Tobacco use  Never smoker.  Family History  Emeline Gins, [place]; 10/16/2018 9:30 AM) Alcohol Abuse  Daughter. Anesthetic complications  Family Members In General. Arthritis  Family Members In General, Mother. Cancer  Family Members In General, Mother. Cerebrovascular Accident  Family Members In General. Depression  Sister. Heart Disease  Father, Mother. Heart disease in female family member before age 26  Hypertension  Family Members In General, Mother. Respiratory Condition  Father. Thyroid problems  Mother.  Pregnancy / Birth History Emeline Gins, [place]; 10/16/2018 9:30 AM) Age at menarche  69 years. Age of menopause  51-55 Contraceptive History  Contraceptive implant, Oral contraceptives. Gravida  2 Maternal age  58-20 Para  1  Other Problems Emeline Gins, [place]; 10/16/2018 9:30 AM) Arthritis  Back Pain  Cancer  Cholelithiasis  General anesthesia - complications  High blood pressure  Hypercholesterolemia  Myocardial infarction  Oophorectomy  Bilateral. Other disease, cancer, significant illness  Sleep Apnea     Review of Systems Ralene Ok MD; 10/16/2018 10:00 AM) General Present- Fatigue. Not Present- Appetite Loss, Chills, Fever, Night Sweats, Weight Gain and Weight Loss. Skin Not Present- Change in Wart/Mole, Dryness, Hives, Jaundice, New Lesions, Non-Healing Wounds, Rash and Ulcer. HEENT Present- Earache, Hearing Loss, Ringing in the Ears and Wears glasses/contact lenses. Not Present- Hoarseness, Nose Bleed, Oral Ulcers, Seasonal Allergies, Sinus Pain, Sore Throat, Visual Disturbances and Yellow Eyes. Respiratory Not Present- Bloody sputum, Chronic Cough, Difficulty Breathing, Snoring and Wheezing. Breast Not Present- Breast Mass, Breast Pain, Nipple Discharge and Skin Changes. Cardiovascular Present- Palpitations and Shortness of Breath. Not Present- Chest Pain, Difficulty Breathing Lying Down, Leg Cramps, Rapid Heart Rate and Swelling of Extremities. Gastrointestinal  Present- Abdominal Pain, Difficulty Swallowing, Excessive gas, Indigestion and Nausea. Not Present- Bloating, Bloody Stool, Change in Bowel Habits, Chronic diarrhea, Constipation, Gets full quickly  at meals, Hemorrhoids, Rectal Pain and Vomiting. Female Genitourinary Present- Urgency. Not Present- Frequency, Nocturia, Painful Urination and Pelvic Pain. Musculoskeletal Present- Back Pain, Joint Pain and Joint Stiffness. Not Present- Muscle Pain, Muscle Weakness and Swelling of Extremities. Neurological Not Present- Decreased Memory, Fainting, Headaches, Numbness, Seizures, Tingling, Tremor, Trouble walking and Weakness. Psychiatric Not Present- Anxiety, Bipolar, Change in Sleep Pattern, Depression, Fearful and Frequent crying. Endocrine Not Present- Cold Intolerance, Excessive Hunger, Hair Changes, Heat Intolerance, Hot flashes and New Diabetes. Hematology Not Present- Blood Thinners, Easy Bruising, Excessive bleeding, Gland problems, HIV and Persistent Infections. All other systems negative  Vitals Emeline Gins CMA; 10/16/2018 9:31 AM) 10/16/2018 9:31 AM Weight: 176.5 lb Height: 63in Body Surface Area: 1.83 m Body Mass Index: 31.27 kg/m  Temp.: 97.32F  Pulse: 60 (Regular)  BP: 148/86 (Sitting, Left Arm, Standard)       Physical Exam Ralene Ok MD; 10/16/2018 10:03 AM) The physical exam findings are as follows: Note:Constitutional: No acute distress, conversant, appears stated age  Eyes: Anicteric sclerae, moist conjunctiva, no lid lag  Neck: No thyromegaly, trachea midline, no cervical lymphadenopathy  Lungs: Clear to auscultation biilaterally, normal respiratory effot  Cardiovascular: regular rate & rhythm, no murmurs, no peripheal edema, pedal pulses 2+  GI: Soft, no masses or hepatosplenomegaly, non-tender to palpation  MSK: Normal gait, no clubbing cyanosis, edema  Skin: No rashes, palpation reveals normal skin turgor  Psychiatric: Appropriate  judgment and insight, oriented to person, place, and time    Assessment & Plan Ralene Ok MD; 10/16/2018 10:04 AM) SYMPTOMATIC CHOLELITHIASIS (K80.20) Impression: Patient is a 72 year old female with history of endometrial cancer, hypertension, mitral valve prolapse, previous MIs and symptomatically gallstones. Patient will require cardiac clearance by Dr. Tamala Julian prior to scheduling surgery. Patient states that she has an issue with sleep apnea and postop after general anesthesia. We will admit her for 23 observations postoperatively.   1. We will proceed to the operating room for a laparoscopic cholecystectomy  2. Risks and benefits were discussed with the patient to generally include, but not limited to: infection, bleeding, possible need for post op ERCP, damage to the bile ducts, bile leak, and possible need for further surgery. Alternatives were offered and described. All questions were answered and the patient voiced understanding of the procedure and wishes to proceed at this point with a laparoscopic cholecystectomy

## 2018-10-24 ENCOUNTER — Telehealth: Payer: Self-pay | Admitting: *Deleted

## 2018-10-24 NOTE — Telephone Encounter (Signed)
Followed up on the [facility] consult. Patient to be scheduled for laparoscopic cholecystectomy with Dr. Rosendo Gros

## 2018-10-27 NOTE — Progress Notes (Signed)
Cardiology Office Note:    Date:  10/28/2018   ID:  Denise Fox, DOB [birth year], MRN [id number]  PCP:  Lauraine Rinne, MD  Cardiologist:  Sinclair Grooms, MD   Referring MD: Lauraine Rinne, MD   Chief Complaint  Patient presents with  . Advice Only    Preoperative clearance  . Hypertension    History of Present Illness:    Denise Fox is a 72 y.o. 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  Nuclear stress test 2017 low risk study normal LVEF 70%, strong family history of CAD, hypertension, and HLD.  Here to get cardiac clearance for upcoming cholecystectomy, which will be performed by Dr. Rosendo Gros.  I saw her a year ago.  Since that time she has undergone simultaneous bilateral knee replacement and hysterectomy in 2019.  The recovery from knee replacement surgery was grueling.  During recovery from both surgeries which have been within the past 6 months, no significant cardiac issues arose.  Recently she had abdominal discomfort.  There was no evidence of cancer.  Gallbladder was found to have multiple stones and hence surgery is recommended.  Prior cardiac evaluation was because of an abnormal resting EKG but subsequent nuclear stress test in 2017 was low risk, EF was 70%, and modifiable risk factors were treated.  No medication side effects.  Past Medical History:  Diagnosis Date  . Arthritis   . Complication of anesthesia    Increased heart rate, and stops breathing in her sleep the 1st 24hrs after the procedure  . Complication of anesthesia    Sleep Apnea  . Hyperlipidemia   . Hypertension   . Myocardial infarction ([place]) 2001   no stents placed  . Prolapsing mitral valve   . Right bundle branch block   . Scoliosis     Past Surgical History:  Procedure Laterality Date  . KNEE SURGERY Bilateral 2007   Mensicus Repair  . ROBOTIC ASSISTED TOTAL HYSTERECTOMY WITH BILATERAL SALPINGO OOPHERECTOMY  Bilateral 10/29/2017   Procedure: XI ROBOTIC ASSISTED TOTAL HYSTERECTOMY WITH BILATERAL SALPINGO OOPHORECTOMY WITH SENTINAL LYMPH NODES, pelvic lymphadenectomy, and left ureterolysis;  Surgeon: Everitt Amber, MD;  Location: [facility];  Service: Gynecology;  Laterality: Bilateral;  . TOTAL KNEE ARTHROPLASTY Bilateral 01/29/2018   Procedure: TOTAL KNEE BILATERAL;  Surgeon: Gaynelle Arabian, MD;  Location: [facility];  Service: Orthopedics;  Laterality: Bilateral;    Current Medications: Current Meds  Medication Sig  . aspirin EC 81 MG tablet Take 1 tablet (81 mg total) by mouth daily.  . metoprolol succinate (TOPROL-XL) 25 MG 24 hr tablet Take 1 tablet (25 mg total) by mouth daily.  Marland Kitchen olmesartan (BENICAR) 40 MG tablet Take 1 tablet (40 mg total) by mouth daily.  . rosuvastatin (CRESTOR) 5 MG tablet Take 1 tablet (5 mg total) by mouth every Monday, Wednesday, and Friday.     Allergies:   Bee venom; Epinephrine; Atorvastatin; Atorvastatin calcium; Hydrocodone; Molds & smuts; Other; Oxycodone; Tramadol; Hydrochlorothiazide; and Lisinopril   Social History   Socioeconomic History  . Marital status: Widowed    Spouse name: Not on file  . Number of children: Not on file  . Years of education: Not on file  . Highest education level: Not on file  Occupational History  . Not on file  Social Needs  . Financial resource strain: Not on file  . Food insecurity:    Worry: Not on file  Inability: Not on file  . Transportation needs:    Medical: Not on file    Non-medical: Not on file  Tobacco Use  . Smoking status: Never Smoker  . Smokeless tobacco: Never Used  Substance and Sexual Activity  . Alcohol use: Yes    Comment: rare  . Drug use: No  . Sexual activity: Never  Lifestyle  . Physical activity:    Days per week: Not on file    Minutes per session: Not on file  . Stress: Not on file  Relationships  . Social connections:    Talks on phone: Not on file    Gets together: Not on file     Attends religious service: Not on file    Active member of club or organization: Not on file    Attends meetings of clubs or organizations: Not on file    Relationship status: Not on file  Other Topics Concern  . Not on file  Social History Narrative  . Not on file     Family History: The patient's family history includes Cancer in her mother; Glaucoma in her sister; Heart disease in her father; Hypertension in her mother; Lung disease in her father; Thyroid disease in her mother.  ROS:   Please see the history of present illness.    Bilateral knee tightness and stiffness following replacement.  Depression after knee replacement, which slowly resolved after 6 months.  Very active now and feels that she is gotten into good shape and as much improved endurance.  All other systems reviewed and are negative.  EKGs/Labs/Other Studies Reviewed:    The following studies were reviewed today: No recent cardiac imaging.  EKG:  EKG is not performed today but when done in September 2019 revealed left axis deviation, poor R wave progression, and normal sinus rhythm.  There is an RSR prime in V1.  Recent Labs: 02/01/2018: Hemoglobin 9.9; Platelets 206 07/08/2018: ALT 19 09/16/2018: BUN 18; Creatinine, Ser 0.86; Potassium 4.5; Sodium 141  Recent Lipid Panel    Component Value Date/Time   CHOL 170 07/08/2018 0744   TRIG 73 07/08/2018 0744   HDL 62 07/08/2018 0744   CHOLHDL 2.7 07/08/2018 0744   LDLCALC 93 07/08/2018 0744    Physical Exam:    VS:  BP 138/82   Pulse (!) 52   Ht 5\' 3"  (1.6 m)   Wt 180 lb (81.6 kg)   SpO2 95%   BMI 31.89 kg/m     Wt Readings from Last 3 Encounters:  10/28/18 180 lb (81.6 kg)  09/16/18 174 lb (78.9 kg)  08/12/18 177 lb 6.4 oz (80.5 kg)     GEN: Mildly obese and appearing younger than stated age.. No acute distress HEENT: Normal NECK: No JVD. LYMPHATICS: No lymphadenopathy CARDIAC: RRR.  No murmur, gallop, edema VASCULAR: Pulses 2+ radial and  carotids bilaterally, Bruits are not heard in the carotids RESPIRATORY:  Clear to auscultation without rales, wheezing or rhonchi  ABDOMEN: Soft, non-tender, non-distended, No pulsatile mass, MUSCULOSKELETAL: No deformity  SKIN: Warm and dry NEUROLOGIC:  Alert and oriented x 3 PSYCHIATRIC:  Normal affect   ASSESSMENT:    1. Preoperative cardiovascular examination   2. Mixed hyperlipidemia   3. Essential hypertension, benign   4. Abnormal EKG    PLAN:    In order of problems listed above:  1. She is functionally stable and is low risk for upcoming general anesthesia associated with bilateral knee replacement. 2. Continue low-dose rosuvastatin for significant  elevation in cholesterol.  Most recent LDL was nearly a year ago.  She has upcoming blood work with Dr. Thornton Dales.  Continue rosuvastatin 5 mg/day. 3. Blood pressure is well controlled.  Low-salt diet and physical activity or encouraged his mainstays of control.  Low-salt diet is also discussed.  Current therapy is Toprol-XL 25 mg daily and Benicar 40 mg/day. 4. EKG is unchanged and is not a reflection of significant underlying coronary disease.  Overall cleared for upcoming surgery for gallbladder removal.  No preoperative cardiovascular evaluation is needed.  Clinical follow-up with me in 1 year.  Needs to have a lipid panel and liver panel done in 2020.   Medication Adjustments/Labs and Tests Ordered: Current medicines are reviewed at length with the patient today.  Concerns regarding medicines are outlined above.  No orders of the defined types were placed in this encounter.  No orders of the defined types were placed in this encounter.   Patient Instructions  Medication Instructions:  Your physician recommends that you continue on your current medications as directed. Please refer to the Current Medication list given to you today.  If you need a refill on your cardiac medications before your next appointment,  please call your pharmacy.   Lab work: None If you have labs (blood work) drawn today and your tests are completely normal, you will receive your results only by: Marland Kitchen MyChart Message (if you have MyChart) OR . A paper copy in the mail If you have any lab test that is abnormal or we need to change your treatment, we will call you to review the results.  Testing/Procedures: None  Follow-Up: At [facility], you and your health needs are our priority.  As part of our continuing mission to provide you with exceptional heart care, we have created designated Provider Care Teams.  These Care Teams include your primary Cardiologist (physician) and Advanced Practice Providers (APPs -  Physician Assistants and Nurse Practitioners) who all work together to provide you with the care you need, when you need it. You will need a follow up appointment in 12 months.  Please call our office 2 months in advance to schedule this appointment.  You may see Sinclair Grooms, MD or one of the following Advanced Practice Providers on your designated Care Team:   Truitt Merle, NP Cecilie Kicks, NP . Kathyrn Drown, NP  Any Other Special Instructions Will Be Listed Below (If Applicable).       Signed, Sinclair Grooms, MD  10/28/2018 9:36 AM    [place]

## 2018-10-28 ENCOUNTER — Ambulatory Visit: Payer: Medicare Other | Admitting: Interventional Cardiology

## 2018-10-28 ENCOUNTER — Encounter: Payer: Self-pay | Admitting: Interventional Cardiology

## 2018-10-28 VITALS — BP 138/82 | HR 52 | Ht 63.0 in | Wt 180.0 lb

## 2018-10-28 DIAGNOSIS — Z01810 Encounter for preprocedural cardiovascular examination: Secondary | ICD-10-CM

## 2018-10-28 DIAGNOSIS — E782 Mixed hyperlipidemia: Secondary | ICD-10-CM | POA: Diagnosis not present

## 2018-10-28 DIAGNOSIS — R9431 Abnormal electrocardiogram [ECG] [EKG]: Secondary | ICD-10-CM

## 2018-10-28 DIAGNOSIS — I10 Essential (primary) hypertension: Secondary | ICD-10-CM

## 2018-10-28 NOTE — Patient Instructions (Signed)

## 2018-11-28 ENCOUNTER — Encounter (HOSPITAL_COMMUNITY): Payer: Self-pay

## 2018-11-28 NOTE — Progress Notes (Signed)
Clearance Daneen Schick Cardiology 10-28-18 in epic  ekg 08-12-18 epic

## 2018-11-28 NOTE — Patient Instructions (Signed)
Denise Fox  11/28/2018   Your procedure is scheduled on: 12-10-18  Report to [facility] Main  Entrance  Report to admitting at                   [place]  AM    Call this number if you have problems the morning of surgery [phone]    Remember: Do not eat food or drink liquids :After Midnight.  BRUSH YOUR TEETH MORNING OF SURGERY AND RINSE YOUR MOUTH OUT, NO CHEWING GUM CANDY OR MINTS.     Take these medicines the morning of surgery with A SIP OF WATER: crestor, metoprolol                                You may not have any metal on your body including hair pins and              piercings  Do not wear jewelry, make-up, lotions, powders or perfumes, deodorant             Do not wear nail polish.  Do not shave  48 hours prior to surgery.        Do not bring valuables to the hospital. [place].  Contacts, dentures or bridgework may not be worn into surgery.  Leave suitcase in the car. After surgery it may be brought to your room.     Name and phone number of your driver:  Special Instructions: N/A              Please read over the following fact sheets you were given: _____________________________________________________________________           [facility] - Preparing for Surgery Before surgery, you can play an important role.  Because skin is not sterile, your skin needs to be as free of germs as possible.  You can reduce the number of germs on your skin by washing with CHG (chlorahexidine gluconate) soap before surgery.  CHG is an antiseptic cleaner which kills germs and bonds with the skin to continue killing germs even after washing. Please DO NOT use if you have an allergy to CHG or antibacterial soaps.  If your skin becomes reddened/irritated stop using the CHG and inform your nurse when you arrive at Short Stay. Do not shave (including legs and underarms) for at least 48 hours prior to the first CHG  shower.  You may shave your face/neck. Please follow these instructions carefully:  1.  Shower with CHG Soap the night before surgery and the  morning of Surgery.  2.  If you choose to wash your hair, wash your hair first as usual with your  normal  shampoo.  3.  After you shampoo, rinse your hair and body thoroughly to remove the  shampoo.                           4.  Use CHG as you would any other liquid soap.  You can apply chg directly  to the skin and wash                       Gently with a scrungie or clean  washcloth.  5.  Apply the CHG Soap to your body ONLY FROM THE NECK DOWN.   Do not use on face/ open                           Wound or open sores. Avoid contact with eyes, ears mouth and genitals (private parts).                       Wash face,  Genitals (private parts) with your normal soap.             6.  Wash thoroughly, paying special attention to the area where your surgery  will be performed.  7.  Thoroughly rinse your body with warm water from the neck down.  8.  DO NOT shower/wash with your normal soap after using and rinsing off  the CHG Soap.                9.  Pat yourself dry with a clean towel.            10.  Wear clean pajamas.            11.  Place clean sheets on your bed the night of your first shower and do not  sleep with pets. Day of Surgery : Do not apply any lotions/deodorants the morning of surgery.  Please wear clean clothes to the hospital/surgery center.  FAILURE TO FOLLOW THESE INSTRUCTIONS MAY RESULT IN THE CANCELLATION OF YOUR SURGERY PATIENT SIGNATURE_________________________________  NURSE SIGNATURE__________________________________  ________________________________________________________________________

## 2018-12-02 ENCOUNTER — Other Ambulatory Visit: Payer: Self-pay

## 2018-12-02 ENCOUNTER — Encounter (HOSPITAL_COMMUNITY): Payer: Self-pay

## 2018-12-02 ENCOUNTER — Encounter (HOSPITAL_COMMUNITY)
Admission: RE | Admit: 2018-12-02 | Discharge: 2018-12-02 | Disposition: A | Discharge status: Home / Self Care | Payer: Medicare Other | Source: Ambulatory Visit | Attending: General Surgery | Admitting: General Surgery

## 2018-12-02 DIAGNOSIS — Z01812 Encounter for preprocedural laboratory examination: Secondary | ICD-10-CM | POA: Diagnosis not present

## 2018-12-02 DIAGNOSIS — K8020 Calculus of gallbladder without cholecystitis without obstruction: Secondary | ICD-10-CM | POA: Insufficient documentation

## 2018-12-02 HISTORY — DX: Malignant (primary) neoplasm, unspecified: C80.1

## 2018-12-02 HISTORY — DX: Dyspnea, unspecified: R06.00

## 2018-12-02 LAB — CBC
HCT: 47.8 % — ABNORMAL HIGH (ref 36.0–46.0)
Hemoglobin: 15.1 g/dL — ABNORMAL HIGH (ref 12.0–15.0)
MCH: 28.9 pg (ref 26.0–34.0)
MCHC: 31.6 g/dL (ref 30.0–36.0)
MCV: 91.4 fL (ref 80.0–100.0)
Platelets: 251 10*3/uL (ref 150–400)
RBC: 5.23 MIL/uL — ABNORMAL HIGH (ref 3.87–5.11)
RDW: 14.4 % (ref 11.5–15.5)
WBC: 7.3 10*3/uL (ref 4.0–10.5)
nRBC: 0 % (ref 0.0–0.2)

## 2018-12-02 LAB — BASIC METABOLIC PANEL
Anion gap: 7 (ref 5–15)
BUN: 26 mg/dL — ABNORMAL HIGH (ref 8–23)
CO2: 26 mmol/L (ref 22–32)
Calcium: 9.7 mg/dL (ref 8.9–10.3)
Chloride: 105 mmol/L (ref 98–111)
Creatinine, Ser: 0.85 mg/dL (ref 0.44–1.00)
GFR calc Af Amer: >60 mL/min (ref >60)
GLUCOSE: 100 mg/dL — AB (ref 70–99)
Potassium: 4.6 mmol/L (ref 3.5–5.1)
Sodium: 138 mmol/L (ref 135–145)

## 2018-12-02 NOTE — Progress Notes (Signed)
PCP Antionette Poles  CARDIOLOGIST:Henry Smith  INFO IN Epic: BUN 26   INFO ON CHART: Clearance Dr. Daneen Schick in epic  BLOOD THINNERS AND LAST DOSES: 81 mg asa will hold one week prior per pt. ____________________________________  PATIENT SYMPTOMS AT TIME OF PREOP: NONE

## 2018-12-05 NOTE — Progress Notes (Signed)
Anesthesia Chart Review    Case:  [id number] Date/Time:  12/10/18 0815   Procedure:  LAPAROSCOPIC CHOLECYSTECTOMY (N/A )   Anesthesia type:  General   Pre-op diagnosis:  Gallstones   Location[facility]  [facility] ROOM 04 / [facility]   Surgeon:  Ralene Ok, MD      DISCUSSION: 73 yo never smoker with h/o HTN, MVP, h/o MI 2001, HLD, RBBB, endometrial cancer scheduled for above surgery on 12/10/2018 with DR. Ralene Ok.   Pt reports with previous anesthesia she experienced increased HR and episode of nocturnal apnea post operatively.  Last surgery 01/29/18 with no anesthesia complications noted.   She was seen by cardiology on 10/28/18, Dr. Daneen Schick, for cardiac clearance.  His note reports she was initially seen by him due to abnormal EKG, subsequent stress test 2017 low risk with EF of 70%, reports modifiable risk factors were treated. Per his note, "Overall cleared for upcoming surgery for gallbladder removal.  No preoperative cardiovascular evaluation is needed.  Clinical follow-up with me in 1 year.  Needs to have a lipid panel and liver panel done in 2020."  VS: BP (!) 155/80   Pulse (!) 58   Temp 36.8 C (Oral)   Ht 5\' 3"  (1.6 m)   Wt 78.5 kg   SpO2 100%   BMI 30.65 kg/m   PROVIDERS: Lauraine Rinne, MD is PCP  Belva Crome, MD is Cardiologist  LABS: Labs reviewed: Acceptable for surgery. (all labs ordered are listed, but only abnormal results are displayed)  Labs Reviewed  BASIC METABOLIC PANEL - Abnormal; Notable for the following components:      Result Value   Glucose, Bld 100 (*)    BUN 26 (*)    All other components within normal limits  CBC - Abnormal; Notable for the following components:   RBC 5.23 (*)    Hemoglobin 15.1 (*)    HCT 47.8 (*)    All other components within normal limits     IMAGES: CT Abdomen Pelvis 09/18/18 IMPRESSION: No acute findings. No evidence of recurrent or metastatic carcinoma. Cholelithiasis.  No radiographic evidence of  cholecystitis. Colonic diverticulosis, without radiographic evidence of diverticulitis.  EKG: 08/12/18 Rate 59 bpm Sinus bradycardia RSR or QR pattern in V1 suggests right ventricular conduction delay Left anterior fascicular block Nonspecific T wave Abnormality  Abnormal ECG  CV: Stress Test 10/11/16  Nuclear stress EF: 70%. No wall motion abnormalities noted  There was no ST segment deviation noted during stress.  This is a low risk study. No perfusion defects at stress.  Past Medical History:  Diagnosis Date  . Arthritis   . Cancer ([place])    endometrial  . Complication of anesthesia    Increased heart rate, and stops breathing in her sleep the 1st 24hrs after the procedure  . Complication of anesthesia    Sleep Apnea    night of surgery  . Dyspnea    after excercise  . Hyperlipidemia   . Hypertension   . Myocardial infarction ([place]) 2001   no stents placed   x 3 small ones  . Prolapsing mitral valve   . Right bundle branch block   . Scoliosis     Past Surgical History:  Procedure Laterality Date  . KNEE SURGERY Bilateral 2007   Mensicus Repair  . ROBOTIC ASSISTED TOTAL HYSTERECTOMY WITH BILATERAL SALPINGO OOPHERECTOMY Bilateral 10/29/2017   Procedure: XI ROBOTIC ASSISTED TOTAL HYSTERECTOMY WITH BILATERAL SALPINGO OOPHORECTOMY WITH SENTINAL LYMPH NODES, pelvic lymphadenectomy,  and left ureterolysis;  Surgeon: Everitt Amber, MD;  Location: [facility];  Service: Gynecology;  Laterality: Bilateral;  . TOTAL KNEE ARTHROPLASTY Bilateral 01/29/2018   Procedure: TOTAL KNEE BILATERAL;  Surgeon: Gaynelle Arabian, MD;  Location: [facility];  Service: Orthopedics;  Laterality: Bilateral;    MEDICATIONS: . aspirin EC 81 MG tablet  . Coenzyme Q10 (COQ10) 200 MG CAPS  . LUTEIN PO  . metoprolol succinate (TOPROL-XL) 25 MG 24 hr tablet  . Misc Natural Products (GLUCOSAMINE CHOND MSM FORMULA PO)  . Multiple Vitamins-Minerals (MULTIVITAMIN PO)  . olmesartan (BENICAR) 40 MG tablet  .  rosuvastatin (CRESTOR) 5 MG tablet  . vitamin E 400 UNIT capsule   No current facility-administered medications for this encounter.     Maia Plan [facility] Testing [phone] 12/05/18 2:22 PM

## 2018-12-05 NOTE — Anesthesia Preprocedure Evaluation (Addendum)
Anesthesia Evaluation  Patient identified by MRN, date of birth, ID band Patient awake    Reviewed: Allergy & Precautions, NPO status , Patient's Chart, lab work & pertinent test results  Airway Mallampati: II  TM Distance: >3 FB Neck ROM: Full    Dental  (+) Dental Advisory Given   Pulmonary neg pulmonary ROS,    breath sounds clear to auscultation       Cardiovascular hypertension, Pt. on medications and Pt. on home beta blockers + dysrhythmias  Rhythm:Regular Rate:Normal  Stress Test 10/11/16 Nuclear stress EF: 70%. No wall motion abnormalities noted. There was no ST segment deviation noted during stress.This is a low risk study. No perfusion defects at stress.   Neuro/Psych negative neurological ROS     GI/Hepatic negative GI ROS, Neg liver ROS,   Endo/Other  negative endocrine ROS  Renal/GU negative Renal ROS     Musculoskeletal  (+) Arthritis ,   Abdominal   Peds  Hematology negative hematology ROS (+)   Anesthesia Other Findings   Reproductive/Obstetrics                           Lab Results  Component Value Date   WBC 7.3 12/02/2018   HGB 15.1 (H) 12/02/2018   HCT 47.8 (H) 12/02/2018   MCV 91.4 12/02/2018   PLT 251 12/02/2018   Lab Results  Component Value Date   CREATININE 0.85 12/02/2018   BUN 26 (H) 12/02/2018   NA 138 12/02/2018   K 4.6 12/02/2018   CL 105 12/02/2018   CO2 26 12/02/2018    Anesthesia Physical Anesthesia Plan  ASA: III  Anesthesia Plan: General   Post-op Pain Management:    Induction: Intravenous  PONV Risk Score and Plan: 3 and Dexamethasone, Ondansetron and Treatment may vary due to age or medical condition  Airway Management Planned: Oral ETT  Additional Equipment:   Intra-op Plan:   Post-operative Plan: Extubation in OR  Informed Consent: I have reviewed the patients History and Physical, chart, labs and discussed the procedure  including the risks, benefits and alternatives for the proposed anesthesia with the patient or authorized representative who has indicated his/her understanding and acceptance.     Dental advisory given  Plan Discussed with: CRNA  Anesthesia Plan Comments:       Anesthesia Quick Evaluation

## 2018-12-10 ENCOUNTER — Ambulatory Visit (HOSPITAL_COMMUNITY)
Admission: RE | Admit: 2018-12-10 | Discharge: 2018-12-10 | Disposition: A | Discharge status: Home / Self Care | Payer: Medicare Other | Attending: General Surgery | Admitting: General Surgery

## 2018-12-10 ENCOUNTER — Ambulatory Visit (HOSPITAL_COMMUNITY): Payer: Medicare Other | Admitting: Physician Assistant

## 2018-12-10 ENCOUNTER — Encounter (HOSPITAL_COMMUNITY): Payer: Self-pay

## 2018-12-10 ENCOUNTER — Encounter (HOSPITAL_COMMUNITY)
Admission: RE | Disposition: A | Discharge status: Home / Self Care | Payer: Self-pay | Source: Home / Self Care | Attending: General Surgery

## 2018-12-10 ENCOUNTER — Ambulatory Visit (HOSPITAL_COMMUNITY): Payer: Medicare Other | Admitting: Anesthesiology

## 2018-12-10 DIAGNOSIS — K8010 Calculus of gallbladder with chronic cholecystitis without obstruction: Secondary | ICD-10-CM | POA: Diagnosis not present

## 2018-12-10 DIAGNOSIS — Z7982 Long term (current) use of aspirin: Secondary | ICD-10-CM | POA: Diagnosis not present

## 2018-12-10 DIAGNOSIS — I10 Essential (primary) hypertension: Secondary | ICD-10-CM | POA: Diagnosis not present

## 2018-12-10 DIAGNOSIS — E7800 Pure hypercholesterolemia, unspecified: Secondary | ICD-10-CM | POA: Diagnosis not present

## 2018-12-10 DIAGNOSIS — Z79899 Other long term (current) drug therapy: Secondary | ICD-10-CM | POA: Insufficient documentation

## 2018-12-10 DIAGNOSIS — I252 Old myocardial infarction: Secondary | ICD-10-CM | POA: Diagnosis not present

## 2018-12-10 DIAGNOSIS — K8020 Calculus of gallbladder without cholecystitis without obstruction: Secondary | ICD-10-CM | POA: Diagnosis present

## 2018-12-10 DIAGNOSIS — Z885 Allergy status to narcotic agent status: Secondary | ICD-10-CM | POA: Diagnosis not present

## 2018-12-10 HISTORY — PX: CHOLECYSTECTOMY: SHX55

## 2018-12-10 SURGERY — LAPAROSCOPIC CHOLECYSTECTOMY
Anesthesia: General | Site: Abdomen

## 2018-12-10 MED ORDER — CHLORHEXIDINE GLUCONATE CLOTH 2 % EX PADS: 6.0000 | MEDICATED_PAD | Freq: Once | CUTANEOUS | Status: DC

## 2018-12-10 MED ORDER — DEXAMETHASONE SODIUM PHOSPHATE 10 MG/ML IJ SOLN
INTRAMUSCULAR | Status: DC | PRN
  Administered 2018-12-10: 10 mg via INTRAVENOUS

## 2018-12-10 MED ORDER — PROPOFOL 10 MG/ML IV BOLUS
INTRAVENOUS | Status: DC | PRN
  Administered 2018-12-10: 140 mg via INTRAVENOUS

## 2018-12-10 MED ORDER — BUPIVACAINE-EPINEPHRINE (PF) 0.25% -1:200000 IJ SOLN
INTRAMUSCULAR | Status: AC
  Filled 2018-12-10: qty 30

## 2018-12-10 MED ORDER — PROMETHAZINE HCL 25 MG/ML IJ SOLN
INTRAMUSCULAR | Status: AC
  Filled 2018-12-10: qty 1

## 2018-12-10 MED ORDER — PROPOFOL 10 MG/ML IV BOLUS
INTRAVENOUS | Status: AC
  Filled 2018-12-10: qty 20

## 2018-12-10 MED ORDER — PROMETHAZINE HCL 25 MG/ML IJ SOLN
6.2500 mg | INTRAMUSCULAR | Status: DC | PRN
  Administered 2018-12-10: 6.25 mg via INTRAVENOUS

## 2018-12-10 MED ORDER — ROCURONIUM BROMIDE 100 MG/10ML IV SOLN
INTRAVENOUS | Status: DC | PRN
  Administered 2018-12-10: 40 mg via INTRAVENOUS

## 2018-12-10 MED ORDER — ONDANSETRON HCL 4 MG/2ML IJ SOLN
INTRAMUSCULAR | Status: AC
  Filled 2018-12-10: qty 2

## 2018-12-10 MED ORDER — BUPIVACAINE-EPINEPHRINE 0.25% -1:200000 IJ SOLN
INTRAMUSCULAR | Status: DC | PRN
  Administered 2018-12-10: 7 mL

## 2018-12-10 MED ORDER — SUGAMMADEX SODIUM 200 MG/2ML IV SOLN
INTRAVENOUS | Status: AC
  Filled 2018-12-10: qty 2

## 2018-12-10 MED ORDER — LACTATED RINGERS IR SOLN
Status: DC | PRN
  Administered 2018-12-10: 1000 mL

## 2018-12-10 MED ORDER — ROCURONIUM BROMIDE 100 MG/10ML IV SOLN
INTRAVENOUS | Status: AC
  Filled 2018-12-10: qty 1

## 2018-12-10 MED ORDER — FENTANYL CITRATE (PF) 100 MCG/2ML IJ SOLN
INTRAMUSCULAR | Status: DC | PRN
  Administered 2018-12-10: 50 ug via INTRAVENOUS
  Administered 2018-12-10: 100 ug via INTRAVENOUS

## 2018-12-10 MED ORDER — CELECOXIB 200 MG PO CAPS
200.0000 mg | ORAL_CAPSULE | ORAL | Status: AC
  Administered 2018-12-10: 200 mg via ORAL
  Filled 2018-12-10: qty 1

## 2018-12-10 MED ORDER — GABAPENTIN 300 MG PO CAPS
300.0000 mg | ORAL_CAPSULE | ORAL | Status: DC
  Filled 2018-12-10: qty 1

## 2018-12-10 MED ORDER — HYDRALAZINE HCL 20 MG/ML IJ SOLN
INTRAMUSCULAR | Status: AC
  Filled 2018-12-10: qty 1

## 2018-12-10 MED ORDER — SUGAMMADEX SODIUM 200 MG/2ML IV SOLN
INTRAVENOUS | Status: DC | PRN
  Administered 2018-12-10: 175 mg via INTRAVENOUS

## 2018-12-10 MED ORDER — 0.9 % SODIUM CHLORIDE (POUR BTL) OPTIME
TOPICAL | Status: DC | PRN
  Administered 2018-12-10: 1000 mL

## 2018-12-10 MED ORDER — HYDRALAZINE HCL 20 MG/ML IJ SOLN
5.0000 mg | INTRAMUSCULAR | Status: DC | PRN
  Administered 2018-12-10: 5 mg via INTRAVENOUS

## 2018-12-10 MED ORDER — CEFAZOLIN SODIUM-DEXTROSE 2-4 GM/100ML-% IV SOLN
2.0000 g | INTRAVENOUS | Status: AC
  Administered 2018-12-10: 2 g via INTRAVENOUS
  Filled 2018-12-10: qty 100

## 2018-12-10 MED ORDER — ONDANSETRON HCL 4 MG/2ML IJ SOLN
INTRAMUSCULAR | Status: DC | PRN
Start: 2018-12-10 — End: 2018-12-10
  Administered 2018-12-10: 4 mg via INTRAVENOUS

## 2018-12-10 MED ORDER — FENTANYL CITRATE (PF) 250 MCG/5ML IJ SOLN
INTRAMUSCULAR | Status: AC
  Filled 2018-12-10: qty 5

## 2018-12-10 MED ORDER — ACETAMINOPHEN-CODEINE #3 300-30 MG PO TABS: 1.0000 | ORAL_TABLET | ORAL | 0 refills | Status: AC | PRN

## 2018-12-10 MED ORDER — LIDOCAINE HCL (CARDIAC) PF 100 MG/5ML IV SOSY
PREFILLED_SYRINGE | INTRAVENOUS | Status: DC | PRN
  Administered 2018-12-10: 60 mg via INTRAVENOUS

## 2018-12-10 MED ORDER — GLYCOPYRROLATE 0.2 MG/ML IJ SOLN
INTRAMUSCULAR | Status: DC | PRN
  Administered 2018-12-10: 0.2 mg via INTRAVENOUS

## 2018-12-10 MED ORDER — ACETAMINOPHEN-CODEINE #3 300-30 MG PO TABS
1.0000 | ORAL_TABLET | ORAL | Status: DC | PRN
  Administered 2018-12-10: 1 via ORAL
  Filled 2018-12-10 (×2): qty 1

## 2018-12-10 MED ORDER — FENTANYL CITRATE (PF) 100 MCG/2ML IJ SOLN: 25.0000 ug | INTRAMUSCULAR | Status: DC | PRN

## 2018-12-10 MED ORDER — LACTATED RINGERS IV SOLN
INTRAVENOUS | Status: DC
  Administered 2018-12-10: 07:00:00 via INTRAVENOUS

## 2018-12-10 MED ORDER — ACETAMINOPHEN 500 MG PO TABS
1000.0000 mg | ORAL_TABLET | ORAL | Status: AC
  Administered 2018-12-10: 1000 mg via ORAL
  Filled 2018-12-10: qty 2

## 2018-12-10 SURGICAL SUPPLY — 33 items
APPLIER CLIP 5 13 M/L LIGAMAX5 (MISCELLANEOUS)
CABLE HIGH FREQUENCY MONO STRZ (ELECTRODE) ×3 IMPLANT
CHLORAPREP W/TINT 26ML (MISCELLANEOUS) ×3 IMPLANT
CLIP APPLIE 5 13 M/L LIGAMAX5 (MISCELLANEOUS) IMPLANT
CLIP VESOLOCK MED LG 6/CT (CLIP) ×6 IMPLANT
COVER MAYO STAND STRL (DRAPES) IMPLANT
COVER TRANSDUCER ULTRASND (DRAPES) ×3 IMPLANT
COVER WAND RF STERILE (DRAPES) IMPLANT
DECANTER SPIKE VIAL GLASS SM (MISCELLANEOUS) ×1 IMPLANT
DERMABOND ADVANCED (GAUZE/BANDAGES/DRESSINGS) ×2
DERMABOND ADVANCED .7 DNX12 (GAUZE/BANDAGES/DRESSINGS) ×2 IMPLANT
DRAPE C-ARM 42X120 X-RAY (DRAPES) IMPLANT
ELECT REM PT RETURN 15FT ADLT (MISCELLANEOUS) ×3 IMPLANT
GLOVE BIO SURGEON STRL SZ7.5 (GLOVE) ×3 IMPLANT
GOWN STRL REUS W/TWL XL LVL3 (GOWN DISPOSABLE) ×6 IMPLANT
GRASPER SUT TROCAR 14GX15 (MISCELLANEOUS) ×2 IMPLANT
HEMOSTAT SURGICEL 4X8 (HEMOSTASIS) IMPLANT
KIT BASIN OR (CUSTOM PROCEDURE TRAY) ×3 IMPLANT
NDL INSUFFLATION 14GA 120MM (NEEDLE) ×1 IMPLANT
NEEDLE INSUFFLATION 14GA 120MM (NEEDLE) ×3 IMPLANT
POUCH RETRIEVAL ECOSAC 10 (ENDOMECHANICALS) IMPLANT
POUCH RETRIEVAL ECOSAC 10MM (ENDOMECHANICALS)
SCISSORS LAP 5X35 DISP (ENDOMECHANICALS) ×3 IMPLANT
SET CHOLANGIOGRAPH MIX (MISCELLANEOUS) IMPLANT
SET IRRIG TUBING LAPAROSCOPIC (IRRIGATION / IRRIGATOR) ×3 IMPLANT
SET TUBE SMOKE EVAC HIGH FLOW (TUBING) ×3 IMPLANT
SLEEVE XCEL OPT CAN 5 100 (ENDOMECHANICALS) ×3 IMPLANT
SUT MNCRL AB 4-0 PS2 18 (SUTURE) ×3 IMPLANT
TOWEL OR 17X26 10 PK STRL BLUE (TOWEL DISPOSABLE) ×3 IMPLANT
TOWEL OR NON WOVEN STRL DISP B (DISPOSABLE) ×3 IMPLANT
TRAY LAPAROSCOPIC (CUSTOM PROCEDURE TRAY) ×3 IMPLANT
TROCAR BLADELESS OPT 5 100 (ENDOMECHANICALS) ×3 IMPLANT
TROCAR XCEL NON-BLD 11X100MML (ENDOMECHANICALS) ×3 IMPLANT

## 2018-12-10 NOTE — Discharge Instructions (Signed)
[facility] ______[facility] °LAPAROSCOPIC SURGERY: POST OP INSTRUCTIONS °Always review your discharge instruction sheet given to you by the facility where your surgery was performed. °IF YOU HAVE DISABILITY OR FAMILY LEAVE FORMS, YOU MUST BRING THEM TO THE OFFICE FOR PROCESSING.   °DO NOT GIVE THEM TO YOUR DOCTOR. ° °1. A prescription for pain medication may be given to you upon discharge.  Take your pain medication as prescribed, if needed.  If narcotic pain medicine is not needed, then you may take acetaminophen (Tylenol) or ibuprofen (Advil) as needed. °2. Take your usually prescribed medications unless otherwise directed. °3. If you need a refill on your pain medication, please contact your pharmacy.  They will contact our office to request authorization. Prescriptions will not be filled after 5pm or on week-ends. °4. You should follow a light diet the first few days after arrival home, such as soup and crackers, etc.  Be sure to include lots of fluids daily. °5. Most patients will experience some swelling and bruising in the area of the incisions.  Ice packs will help.  Swelling and bruising can take several days to resolve.  °6. It is common to experience some constipation if taking pain medication after surgery.  Increasing fluid intake and taking a stool softener (such as Colace) will usually help or prevent this problem from occurring.  A mild laxative (Milk of Magnesia or Miralax) should be taken according to package instructions if there are no bowel movements after 48 hours. °7. Unless discharge instructions indicate otherwise, you may remove your bandages 24-48 hours after surgery, and you may shower at that time.  You may have steri-strips (small skin tapes) in place directly over the incision.  These strips should be left on the skin for 7-10 days.  If your surgeon used skin glue on the incision, you may shower in 24 hours.  The glue will flake off over the next 2-3 weeks.  Any sutures or  staples will be removed at the office during your follow-up visit. °8. ACTIVITIES:  You may resume regular (light) daily activities beginning the next day--such as daily self-care, walking, climbing stairs--gradually increasing activities as tolerated.  You may have sexual intercourse when it is comfortable.  Refrain from any heavy lifting or straining until approved by your doctor. °a. You may drive when you are no longer taking prescription pain medication, you can comfortably wear a seatbelt, and you can safely maneuver your car and apply brakes. °b. RETURN TO WORK:  __________________________________________________________ °9. You should see your doctor in the office for a follow-up appointment approximately 2-3 weeks after your surgery.  Make sure that you call for this appointment within a day or two after you arrive home to insure a convenient appointment time. °10. OTHER INSTRUCTIONS: __________________________________________________________________________________________________________________________ __________________________________________________________________________________________________________________________ °WHEN TO CALL YOUR DOCTOR: °1. Fever over 101.0 °2. Inability to urinate °3. Continued bleeding from incision. °4. Increased pain, redness, or drainage from the incision. °5. Increasing abdominal pain ° °The clinic staff is available to answer your questions during regular business hours.  Please don’t hesitate to call and ask to speak to one of the nurses for clinical concerns.  If you have a medical emergency, go to the nearest emergency room or call 911.  A surgeon from [facility] is always on call at the hospital. °[address], [address], [place], [place]  [address] ? [address] [address], [place], [place]   [address] °[phone] ? [phone] ? FAX [phone] °Web site:   [email or web address] °

## 2018-12-10 NOTE — Anesthesia Postprocedure Evaluation (Signed)
Anesthesia Post Note  Patient: Denise Fox  Procedure(s) Performed: LAPAROSCOPIC CHOLECYSTECTOMY (N/A Abdomen)     Patient location during evaluation: PACU Anesthesia Type: General Level of consciousness: awake and alert Pain management: pain level controlled Vital Signs Assessment: post-procedure vital signs reviewed and stable Respiratory status: spontaneous breathing, nonlabored ventilation, respiratory function stable and patient connected to nasal cannula oxygen Cardiovascular status: blood pressure returned to baseline and stable Postop Assessment: no apparent nausea or vomiting Anesthetic complications: no    Last Vitals:  Vitals:   12/10/18 1115 12/10/18 1145  BP: (!) 153/74 (!) 141/65  Pulse: (!) 51 (!) 53  Resp: 18   Temp: 36.5 C   SpO2: 100% 100%    Last Pain:  Vitals:   12/10/18 1115  TempSrc:   PainSc: 6                  Tiajuana Amass

## 2018-12-10 NOTE — Anesthesia Procedure Notes (Signed)
Procedure Name: Intubation Date/Time: 12/10/2018 8:38 AM Performed by: Glory Buff, CRNA Pre-anesthesia Checklist: Patient identified, Emergency Drugs available, Suction available and Patient being monitored Patient Re-evaluated:Patient Re-evaluated prior to induction Oxygen Delivery Method: Circle system utilized Preoxygenation: Pre-oxygenation with 100% oxygen Induction Type: IV induction Ventilation: Mask ventilation without difficulty Laryngoscope Size: Miller and 3 Grade View: Grade I Tube type: Oral Tube size: 7.0 mm Number of attempts: 2 (Vocal cords not fully relaxed on first DL, Second DL vocal cords relaxed, oral intubation with +ETCO2) Airway Equipment and Method: Stylet and Oral airway Placement Confirmation: ETT inserted through vocal cords under direct vision,  positive ETCO2 and breath sounds checked- equal and bilateral Secured at: 20 cm Tube secured with: Tape Dental Injury: Teeth and Oropharynx as per pre-operative assessment

## 2018-12-10 NOTE — Op Note (Signed)
12/10/2018  9:13 AM  PATIENT:  Denise Fox  73 y.o. female  PRE-OPERATIVE DIAGNOSIS:  Gallstones  POST-OPERATIVE DIAGNOSIS:  Gallstones  PROCEDURE:  Procedure(s): LAPAROSCOPIC CHOLECYSTECTOMY (N/A)  SURGEON:  Surgeon(s) and Role:    * Ralene Ok, MD - Primary  ANESTHESIA:   local and general  EBL:  minimal   BLOOD ADMINISTERED:none  DRAINS: none   LOCAL MEDICATIONS USED:  BUPIVICAINE   SPECIMEN:  Source of Specimen:  gallbladder  DISPOSITION OF SPECIMEN:  PATHOLOGY  COUNTS:  YES  TOURNIQUET:  * No tourniquets in log *  DICTATION: .Dragon Dictation The patient was taken to the operating and placed in the supine position with bilateral SCDs in place.  The patient was prepped and draped in the usual sterile fashion. A time out was called and all facts were verified. A pneumoperitoneum was obtained via A Veress needle technique to a pressure of 50mm of mercury.  A 20mm trochar was then placed in the right upper quadrant under visualization, and there were no injuries to any abdominal organs. A 11 mm port was then placed in the umbilical region after infiltrating with local anesthesia under direct visualization. A second and third epigastric port and right lower quadrant port placement under direct visualization, respectively.    The gallbladder was identified and retracted, the peritoneum was then sharply dissected from the gallbladder and this dissection was carried down to Calot's triangle. The gallbladder was identified and stripped away circumferentially and seen going into the gallbladder 360, the critical angle was obtained.  2 clips were placed proximally one distally and the cystic duct transected. The cystic artery was identified and 2 clips placed proximally and one distally and transected.  We then proceeded to remove the gallbladder off the hepatic fossa with Bovie cautery. A retrieval bag was then placed in the abdomen and gallbladder placed in the bag. The  hepatic fossa was then reexamined and hemostasis was achieved with Bovie cautery and was excellent at the end of the case.   The subhepatic fossa and perihepatic fossa was then irrigated until the effluent was clear.  The gallbladder and bag were removed from the abdominal cavity. The 11 mm trocar fascia was reapproximated with the Endo Close #1 Vicryl x2.  The pneumoperitoneum was evacuated and all trochars removed under direct visulalization.  The skin was then closed with 4-0 Monocryl and the skin dressed with Dermabond.    The patient was awaken from general anesthesia and taken to the recovery room in stable condition.   PLAN OF CARE: Discharge to home after PACU  PATIENT DISPOSITION:  PACU - hemodynamically stable.   Delay start of Pharmacological VTE agent (>24hrs) due to surgical blood loss or risk of bleeding: not applicable

## 2018-12-10 NOTE — H&P (Signed)
History of Present Illness The patient is a 73 year old female who presents for evaluation of gall stones. Referred by: Dr. Denman George Chief Complaint: Abdominal pain, gallstones 73 year old female with a history of previous MIs, mitral valve prolapse, hypertension, endometrial cancer who comes in with a history of gallstones. Patient states that she's had approximately 1 year history of abdominal pain that is originating periumbilically and radiated to the right upper quadrant area. She states that this usually associated with some nausea however no emesis. States that she's been cutting back on spicy foods and fatty foods and feels that the episodes of pain have decreased.  Patient's had previous ultrasound, MRI, CT scan which did reveal cholelithiasis. I did review this personally.  Patient's other previous robotic total hysterectomy by Dr. Harrington Challenger in the past.  Patient sees Dr. Daneen Schick her cardiologist.    Past Surgical History  Hysterectomy (due to cancer) - Complete  Knee Surgery  Bilateral. Oral Surgery  Tonsillectomy   Diagnostic Studies History  Colonoscopy  within last year Mammogram  within last year Pap Smear  >5 years ago  Allergies  EPINEPHrine *Vasopressors  Codeine and Related  Allergies Reconciled   Medication History Metoprolol Succinate ER (25MG  Tablet ER 24HR, Oral) Active. Rosuvastatin Calcium (5MG  Tablet, Oral) Active. Prevnar 13 (Intramuscular) Active. Olmesartan Medoxomil (40MG  Tablet, Oral) Active. Medications Reconciled  Social History Alcohol use  Recently quit alcohol use. Illicit drug use  Remotely quit drug use. No caffeine use  Tobacco use  Never smoker.  Family History  Alcohol Abuse  Daughter. Anesthetic complications  Family Members In General. Arthritis  Family Members In General, Mother. Cancer  Family Members In General, Mother. Cerebrovascular Accident  Family Members In General. Depression   Sister. Heart Disease  Father, Mother. Heart disease in female family member before age 37  Hypertension  Family Members In General, Mother. Respiratory Condition  Father. Thyroid problems  Mother.  Pregnancy / Birth History  Age at menarche  41 years. Age of menopause  [age] Contraceptive History  Contraceptive implant, Oral contraceptives. Gravida  2 Maternal age  19-20 Para  1  Other Problems  Arthritis  Back Pain  Cancer  Cholelithiasis  General anesthesia - complications  High blood pressure  Hypercholesterolemia  Myocardial infarction  Oophorectomy  Bilateral. Other disease, cancer, significant illness  Sleep Apnea     Review of Systems General Present- Fatigue. Not Present- Appetite Loss, Chills, Fever, Night Sweats, Weight Gain and Weight Loss. Skin Not Present- Change in Wart/Mole, Dryness, Hives, Jaundice, New Lesions, Non-Healing Wounds, Rash and Ulcer. HEENT Present- Earache, Hearing Loss, Ringing in the Ears and Wears glasses/contact lenses. Not Present- Hoarseness, Nose Bleed, Oral Ulcers, Seasonal Allergies, Sinus Pain, Sore Throat, Visual Disturbances and Yellow Eyes. Respiratory Not Present- Bloody sputum, Chronic Cough, Difficulty Breathing, Snoring and Wheezing. Breast Not Present- Breast Mass, Breast Pain, Nipple Discharge and Skin Changes. Cardiovascular Present- Palpitations and Shortness of Breath. Not Present- Chest Pain, Difficulty Breathing Lying Down, Leg Cramps, Rapid Heart Rate and Swelling of Extremities. Gastrointestinal Present- Abdominal Pain, Difficulty Swallowing, Excessive gas, Indigestion and Nausea. Not Present- Bloating, Bloody Stool, Change in Bowel Habits, Chronic diarrhea, Constipation, Gets full quickly at meals, Hemorrhoids, Rectal Pain and Vomiting. Female Genitourinary Present- Urgency. Not Present- Frequency, Nocturia, Painful Urination and Pelvic Pain. Musculoskeletal Present- Back Pain, Joint Pain and  Joint Stiffness. Not Present- Muscle Pain, Muscle Weakness and Swelling of Extremities. Neurological Not Present- Decreased Memory, Fainting, Headaches, Numbness, Seizures, Tingling, Tremor, Trouble walking and Weakness.  Psychiatric Not Present- Anxiety, Bipolar, Change in Sleep Pattern, Depression, Fearful and Frequent crying. Endocrine Not Present- Cold Intolerance, Excessive Hunger, Hair Changes, Heat Intolerance, Hot flashes and New Diabetes. Hematology Not Present- Blood Thinners, Easy Bruising, Excessive bleeding, Gland problems, HIV and Persistent Infections. All other systems negative  BP (!) 154/90   Pulse (!) 56   Temp 98 F (36.7 C) (Oral)   Resp 16   Ht 5\' 3"  (1.6 m)   Wt 78.5 kg   SpO2 99%   BMI 30.65 kg/m     Physical Exam  The physical exam findings are as follows: Note:Constitutional: No acute distress, conversant, appears stated age  Eyes: Anicteric sclerae, moist conjunctiva, no lid lag  Neck: No thyromegaly, trachea midline, no cervical lymphadenopathy  Lungs: Clear to auscultation biilaterally, normal respiratory effot  Cardiovascular: regular rate & rhythm, no murmurs, no peripheal edema, pedal pulses 2+  GI: Soft, no masses or hepatosplenomegaly, non-tender to palpation  MSK: Normal gait, no clubbing cyanosis, edema  Skin: No rashes, palpation reveals normal skin turgor  Psychiatric: Appropriate judgment and insight, oriented to person, place, and time    Assessment & Plan SYMPTOMATIC CHOLELITHIASIS (K80.20) Impression: Patient is a 72 year old female with history of endometrial cancer, hypertension, mitral valve prolapse, previous MIs and symptomatically gallstones.    1. We will proceed to the operating room for a laparoscopic cholecystectomy  2. Risks and benefits were discussed with the patient to generally include, but not limited to: infection, bleeding, possible need for post op ERCP, damage to the bile ducts, bile  leak, and possible need for further surgery. Alternatives were offered and described. All questions were answered and the patient voiced understanding of the procedure and wishes to proceed at this point with a laparoscopic cholecystectomy

## 2018-12-10 NOTE — Transfer of Care (Signed)
Immediate Anesthesia Transfer of Care Note  Patient: Denise Fox  Procedure(s) Performed: LAPAROSCOPIC CHOLECYSTECTOMY (N/A Abdomen)  Patient Location: PACU  Anesthesia Type:General  Level of Consciousness: awake, alert  and oriented  Airway & Oxygen Therapy: Patient Spontanous Breathing and Patient connected to face mask oxygen  Post-op Assessment: Report given to RN and Post -op Vital signs reviewed and stable  Post vital signs: Reviewed and stable  Last Vitals:  Vitals Value Taken Time  BP    Temp    Pulse 35 12/10/2018  9:35 AM  Resp 10 12/10/2018  9:36 AM  SpO2 85 % 12/10/2018  9:35 AM  Vitals shown include unvalidated device data.  Last Pain:  Vitals:   12/10/18 0708  TempSrc:   PainSc: 2       Patients Stated Pain Goal: 1 (46/50/35 4656)  Complications: No apparent anesthesia complications

## 2018-12-11 ENCOUNTER — Encounter (HOSPITAL_COMMUNITY): Payer: Self-pay | Admitting: General Surgery

## 2019-07-14 IMAGING — MR MR ABDOMEN WO/W CM
12 of 17 series · 24 of 48 positions shown · IV contrast (16 ML MULTIHANCE)
Comparison: Outpatient note from 08/01/2017. This describes an
ultrasound from [REDACTED] dated 08/01/2017.

CLINICAL DATA: Outside ultrasound demonstrating "Abnormal material
within the endometrial cavity" . Pelvic discomfort for 6 months.

EXAM:
MRI ABDOMEN AND PELVIS WITHOUT AND WITH CONTRAST
TECHNIQUE: Multiplanar multisequence MR imaging of the abdomen and pelvis was
performed both before and after the administration of intravenous
contrast.
CONTRAST:  16mL MULTIHANCE GADOBENATE DIMEGLUMINE 529 MG/ML IV SOLN
Creatinine was obtained on site at [HOSPITAL] at [HOSPITAL].
Results: Creatinine 0.7 mg/dL.

[Series 2: cor haste · coronal · 6.0mm · 0.78mm/px · 3 of 25 slices shown]
[im 1/25]
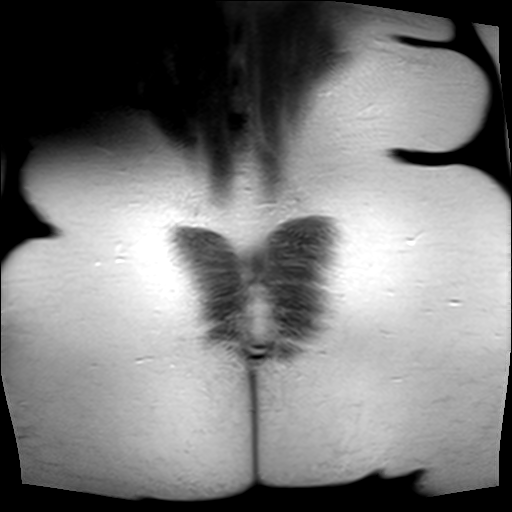
[im 13/25]
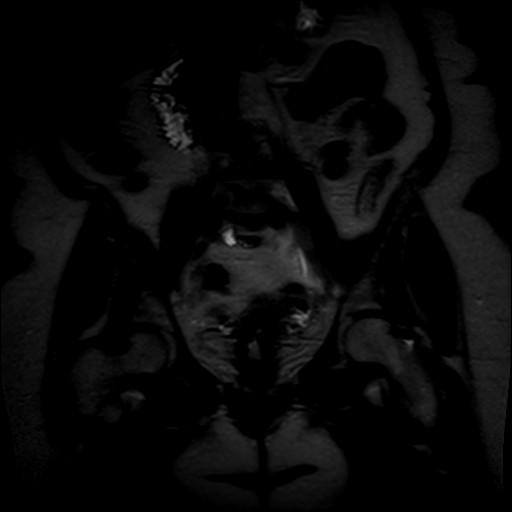
[im 25/25]
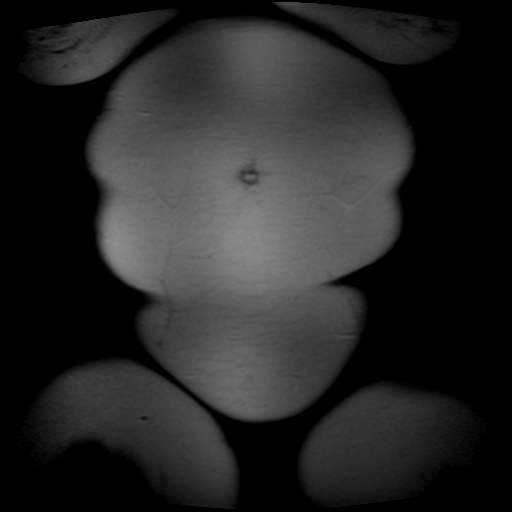

[Series 3: t2_tse_sag · sagittal · 5.0mm · 1.05mm/px · 3 of 27 slices shown]
[im 1/27]
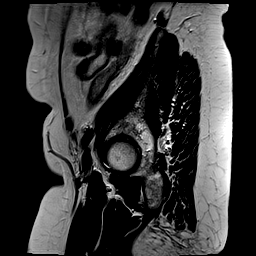
[im 14/27]
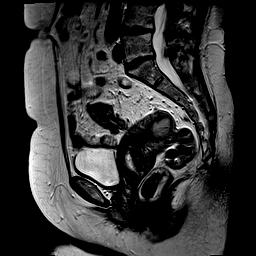
[im 27/27]
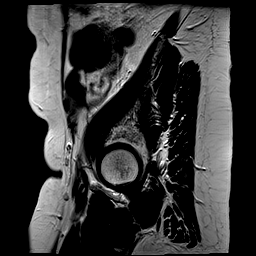

[Series 4: t2_tse axial · axial · 7.0mm · 0.98mm/px · z∈[-206,+21]mm · 2 of 26 slices shown]
[im 1/26]
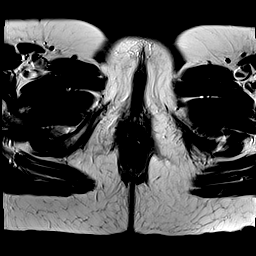
[im 26/26]
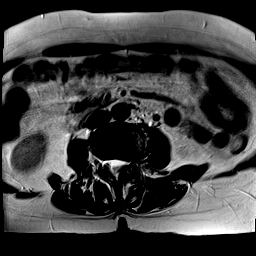

[Series 5: t2_tse axial fs · axial · 7.0mm · 0.98mm/px · z∈[-206,+21]mm · 2 of 26 slices shown]
[im 1/26]
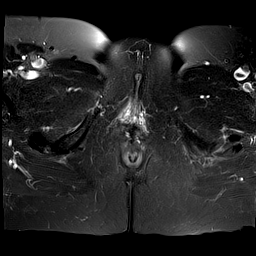
[im 26/26]
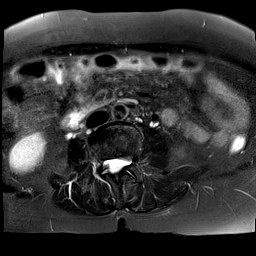

[Series 6: axial spgr · axial · 7.0mm · 0.98mm/px · z∈[-206,+21]mm · 2 of 26 slices shown]
[im 1/26]
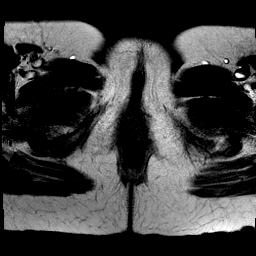
[im 26/26]
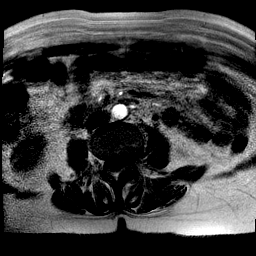

[Series 7: axial spgr pre · axial · non-contrast · 7.0mm · 0.49mm/px · z∈[-206,+21]mm · 2 of 26 slices shown]
[im 1/26]
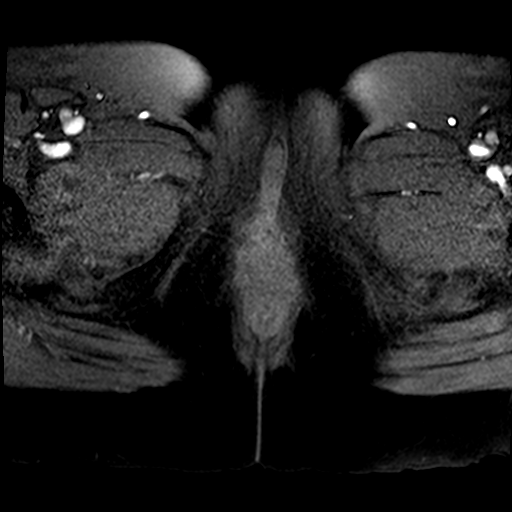
[im 26/26]
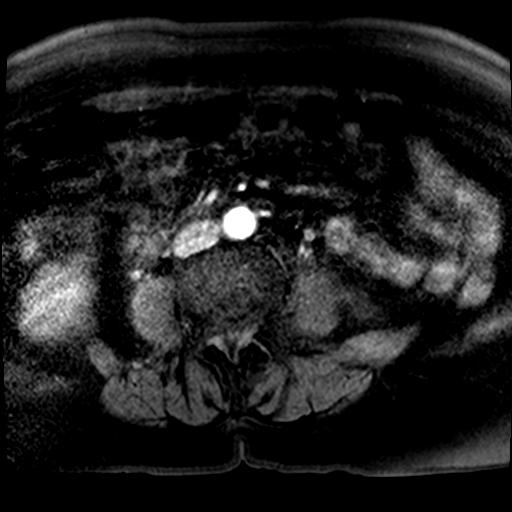

[Series 8: axial spgr post · axial · 7.0mm · 0.49mm/px · z∈[-206,+21]mm · 2 of 26 slices shown (1 of 6)]
[im 1/26]
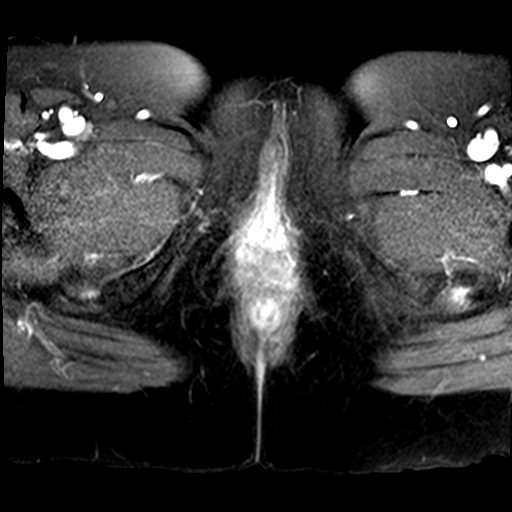
[im 26/26]
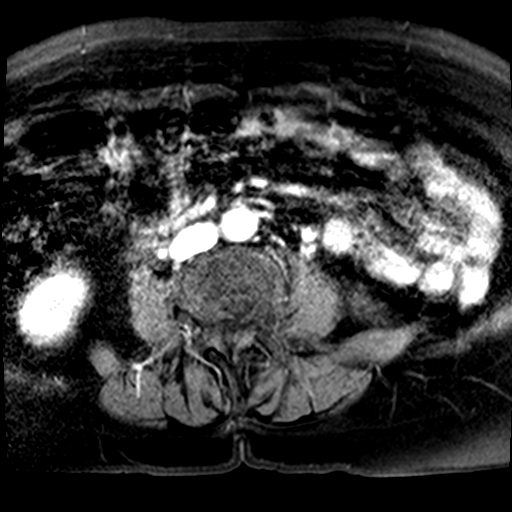

[Series 9: axial spgr post · axial · 7.0mm · 0.49mm/px · z∈[-206,+21]mm · 2 of 26 slices shown (2 of 6)]
[im 1/26]
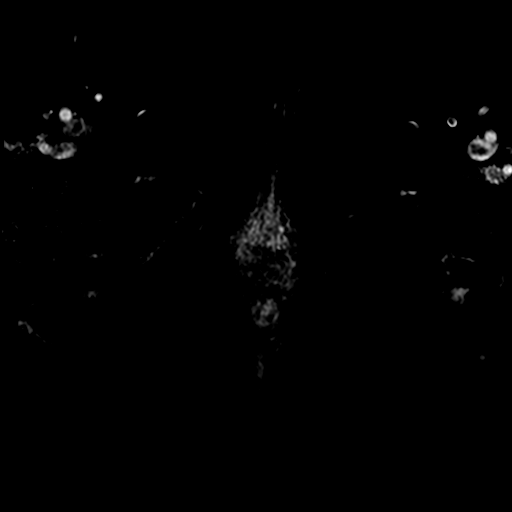
[im 26/26]
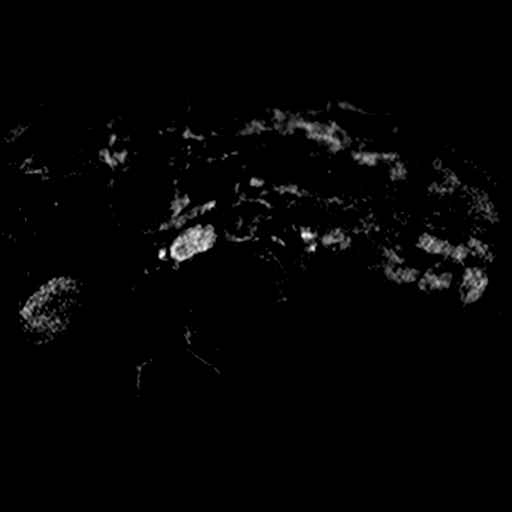

[Series 10: axial spgr post · axial · 234.5mm · 0.49mm/px · 1 of 1 slices shown (3 of 6)]
[im 1/1]
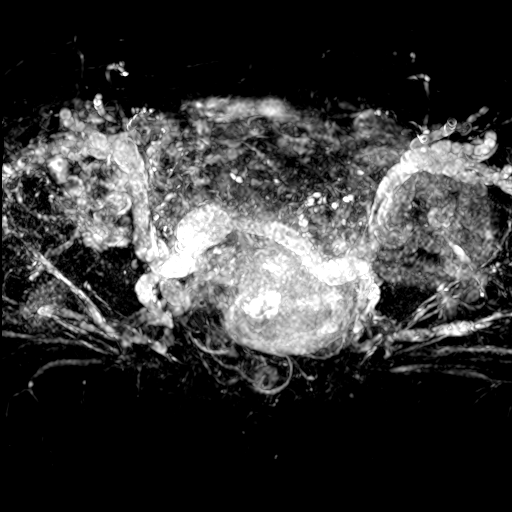

[Series 11: axial spgr post · axial · 7.0mm · 0.49mm/px · z∈[-206,+21]mm · 2 of 26 slices shown (4 of 6)]
[im 1/26]
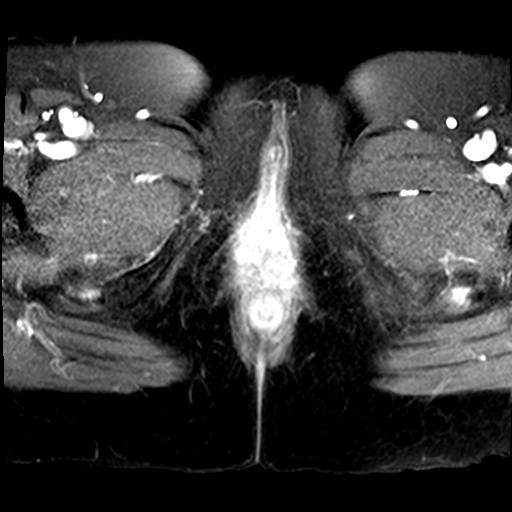
[im 26/26]
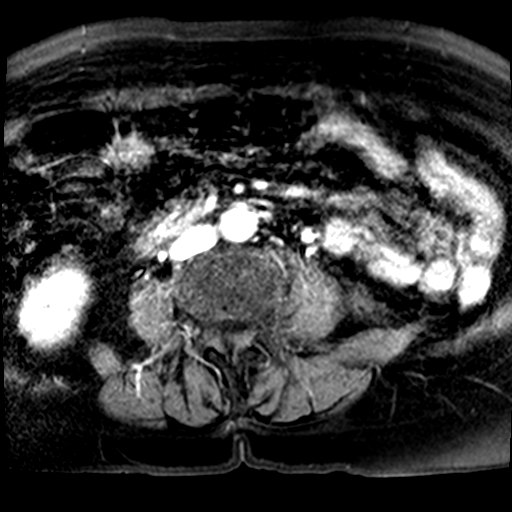

[Series 12: axial spgr post · axial · 7.0mm · 0.49mm/px · z∈[-206,+21]mm · 2 of 26 slices shown (5 of 6)]
[im 1/26]
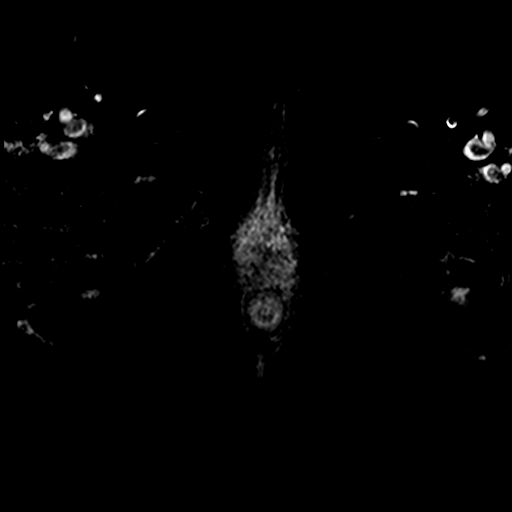
[im 26/26]
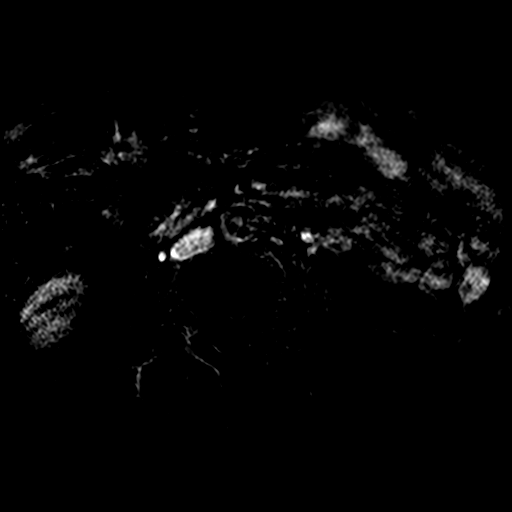

[Series 13: axial spgr post · axial · 234.5mm · 0.49mm/px · 1 of 1 slices shown (6 of 6)]
[im 1/1]
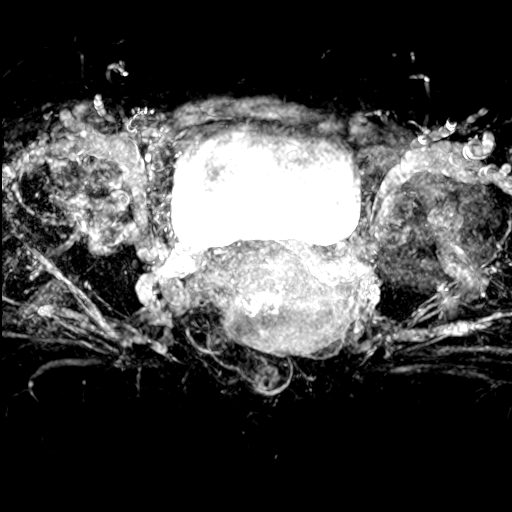

[24 of 48 positions shown; findings below may reference images not displayed]

FINDINGS: COMBINED FINDINGS FOR BOTH MR ABDOMEN AND PELVIS

Lower chest: Normal heart size without pericardial or pleural
effusion.

Hepatobiliary: Mild hepatic steatosis. No suspicious liver lesion. 7
mm gallstone on image 15/ series 6. No cholecystitis or biliary duct
dilatation.

Pancreas:  Normal, without mass or ductal dilatation.

Spleen: Subcentimeter subcapsular splenic T2 hyperintense lesion is
of doubtful clinical significance.

Adrenals/Urinary Tract: Normal adrenal glands. Too small to
characterize bilateral renal lesions. No hydronephrosis. Normal
urinary bladder.

Stomach/Bowel: Normal stomach, without wall thickening. Colonic
diverticulosis. Wall thickening involving the sigmoid is likely due
to muscular hypertrophy on image 14/series 4. Otherwise normal large
and small bowel loops.

Vascular/Lymphatic: Aortic and branch vessel atherosclerosis. No
abdominopelvic adenopathy.

Reproductive: The uterus is retroverted. The endometrium is
abnormal, with thickening and heterogeneity. Example at maximally
2.4 cm on image 16/series 3. The endometrium is heterogeneously
hypoenhancing, including on subtracted series 12. The junctional
zone is faint within the right side of the uterine fundus on image
15/series 4.

Multiple uterine fibroids, including a T2 hypointense uterine body
1.9 cm lesion on image 20/series 3. A 1.2 cm left-sided uterine body
lesion on image 18/series 4 is also felt to represent a fibroid
(rather than endometrial extension into the myometrium).

Probable residual follicle in the right ovary at 1.4 cm on image
16/series 4. No left adnexal mass.

Other: No significant free fluid. No evidence of omental or
peritoneal disease.

Musculoskeletal: Moderate S-shaped lumbar spine curvature with
spondylosis.
IMPRESSION: 1. Findings which are suspicious for endometrial carcinoma, with
thickened heterogeneous endometrium. Ill definition of the
junctional zone within the right-side of the uterine fundus is
suspicious for myometrial invasion.
2. No evidence of metastatic disease in the abdomen or pelvis.
3. Mild hepatic steatosis.
4. Cholelithiasis.
5. Uterine fibroids.

## 2019-10-09 ENCOUNTER — Other Ambulatory Visit: Payer: Self-pay | Admitting: Physician Assistant

## 2019-11-01 ENCOUNTER — Other Ambulatory Visit: Payer: Self-pay | Admitting: Physician Assistant

## 2019-11-01 DIAGNOSIS — I10 Essential (primary) hypertension: Secondary | ICD-10-CM

## 2019-12-02 ENCOUNTER — Other Ambulatory Visit: Payer: Self-pay

## 2019-12-02 DIAGNOSIS — I10 Essential (primary) hypertension: Secondary | ICD-10-CM

## 2019-12-02 MED ORDER — OLMESARTAN MEDOXOMIL 40 MG PO TABS: 40.0000 mg | ORAL_TABLET | Freq: Every day | ORAL | 0 refills | Status: DC

## 2019-12-02 MED ORDER — METOPROLOL SUCCINATE ER 25 MG PO TB24: 25.0000 mg | ORAL_TABLET | Freq: Every day | ORAL | 0 refills | Status: DC

## 2019-12-11 ENCOUNTER — Other Ambulatory Visit: Payer: Self-pay

## 2019-12-11 DIAGNOSIS — I10 Essential (primary) hypertension: Secondary | ICD-10-CM

## 2019-12-11 MED ORDER — METOPROLOL SUCCINATE ER 25 MG PO TB24: 25.0000 mg | ORAL_TABLET | Freq: Every day | ORAL | 0 refills | Status: DC

## 2019-12-11 MED ORDER — OLMESARTAN MEDOXOMIL 40 MG PO TABS: 40.0000 mg | ORAL_TABLET | Freq: Every day | ORAL | 0 refills | Status: DC

## 2019-12-21 ENCOUNTER — Other Ambulatory Visit: Payer: Self-pay

## 2019-12-21 DIAGNOSIS — I10 Essential (primary) hypertension: Secondary | ICD-10-CM

## 2019-12-21 MED ORDER — METOPROLOL SUCCINATE ER 25 MG PO TB24: 25.0000 mg | ORAL_TABLET | Freq: Every day | ORAL | 0 refills | Status: DC

## 2019-12-21 MED ORDER — OLMESARTAN MEDOXOMIL 40 MG PO TABS: 40.0000 mg | ORAL_TABLET | Freq: Every day | ORAL | 0 refills | Status: DC

## 2019-12-22 ENCOUNTER — Other Ambulatory Visit: Payer: Self-pay | Admitting: Interventional Cardiology

## 2019-12-22 MED ORDER — ROSUVASTATIN CALCIUM 5 MG PO TABS: ORAL_TABLET | ORAL | 0 refills | Status: DC

## 2020-01-03 ENCOUNTER — Ambulatory Visit: Payer: Medicare Other | Attending: Internal Medicine

## 2020-01-03 DIAGNOSIS — Z23 Encounter for immunization: Secondary | ICD-10-CM | POA: Insufficient documentation

## 2020-01-03 NOTE — Progress Notes (Signed)
   Covid-19 Vaccination Clinic  Name:  Denise Fox    MRN: [id number] DOB: 1946/11/06  01/03/2020  Denise Fox was observed post Covid-19 immunization for 30 minutes based on pre-vaccination screening without incidence. She was provided with Vaccine Information Sheet and instruction to access the V-Safe system.   Denise Fox was instructed to call 911 with any severe reactions post vaccine: Marland Kitchen Difficulty breathing  . Swelling of your face and throat  . A fast heartbeat  . A bad rash all over your body  . Dizziness and weakness    Immunizations Administered    Name Date Dose VIS Date Route   Pfizer COVID-19 Vaccine 01/03/2020  4:53 PM 0.3 mL 11/06/2019 Intramuscular   Manufacturer: [place]   Lot: [id number]   [place]: [id number]

## 2020-01-24 ENCOUNTER — Ambulatory Visit: Payer: Medicare Other

## 2020-01-28 ENCOUNTER — Ambulatory Visit: Payer: Medicare Other | Attending: Internal Medicine

## 2020-01-28 ENCOUNTER — Other Ambulatory Visit: Payer: Self-pay

## 2020-01-28 DIAGNOSIS — I10 Essential (primary) hypertension: Secondary | ICD-10-CM

## 2020-01-28 DIAGNOSIS — Z23 Encounter for immunization: Secondary | ICD-10-CM | POA: Insufficient documentation

## 2020-01-28 MED ORDER — METOPROLOL SUCCINATE ER 25 MG PO TB24: 25.0000 mg | ORAL_TABLET | Freq: Every day | ORAL | 0 refills | Status: DC

## 2020-01-28 MED ORDER — OLMESARTAN MEDOXOMIL 40 MG PO TABS: 40.0000 mg | ORAL_TABLET | Freq: Every day | ORAL | 0 refills | Status: DC

## 2020-01-28 NOTE — Progress Notes (Signed)
   Covid-19 Vaccination Clinic  Name:  Denise Fox    MRN: [id number] DOB: [birth year]  01/28/2020  [name] was observed post Covid-19 immunization for 30 minutes based on pre-vaccination screening without incident. She was provided with Vaccine Information Sheet and instruction to access the V-Safe system.   [name] was instructed to call 911 with any severe reactions post vaccine: Marland Kitchen Difficulty breathing  . Swelling of face and throat  . A fast heartbeat  . A bad rash all over body  . Dizziness and weakness   Immunizations Administered    Name Date Dose VIS Date Route   Pfizer COVID-19 Vaccine 01/28/2020 12:51 PM 0.3 mL 11/06/2019 Intramuscular   Manufacturer: [place]   Lot: [id number]   [place]: [id number]

## 2020-02-04 NOTE — Progress Notes (Signed)
Cardiology Office Note:    Date:  02/05/2020   ID:  Denise Fox, DOB 1946/08/18, MRN [id number]  PCP:  Lauraine Rinne, MD  Cardiologist:  Sinclair Grooms, MD   Referring MD: Lauraine Rinne, MD   Chief Complaint  Patient presents with  . Hypertension  . Hyperlipidemia    History of Present Illness:    Denise Fox is a 74 y.o. 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  He has had a relatively quiet 2020 related to the COVID-19 pandemic.  There are no cardiac complaints.  She has not had syncope.  She specifically denies chest pain, orthopnea, PND, palpitations, and syncope.  She is compliant with her current list of medications.  Her home monitored blood pressure is always above 140 and blood pressures in physician's offices are usually greater than [id number] mmHg systolic.  She had a reaction to lisinopril which caused cough years ago.  It was a combination pill with HCTZ to which which she also attributes the cough  Past Medical History:  Diagnosis Date  . Arthritis   . 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 Complication of anesthesia    Increased heart rate, and stops breathing in her sleep the 1st 24hrs after the procedure  . Complication of anesthesia    Sleep Apnea    night of surgery  . Dyspnea    after excercise  . Hyperlipidemia   . Hypertension   . Myocardial infarction ([place]) 2001   no stents placed   x 3 small ones  . Prolapsing mitral valve   . Right bundle branch block   . Scoliosis     Past Surgical History:  Procedure Laterality Date  . CHOLECYSTECTOMY N/A 12/10/2018   Procedure: LAPAROSCOPIC CHOLECYSTECTOMY;  Surgeon: Ralene Ok, MD;  Location: [facility];  Service: General;  Laterality: N/A;  . KNEE SURGERY Bilateral 2007   [facility]  . ROBOTIC ASSISTED TOTAL HYSTERECTOMY WITH BILATERAL  SALPINGO OOPHERECTOMY Bilateral 10/29/2017   Procedure: XI ROBOTIC ASSISTED TOTAL HYSTERECTOMY WITH BILATERAL SALPINGO OOPHORECTOMY WITH SENTINAL LYMPH NODES, pelvic lymphadenectomy, and left ureterolysis;  Surgeon: Everitt Amber, MD;  Location: [facility];  Service: Gynecology;  Laterality: Bilateral;  . TOTAL KNEE ARTHROPLASTY Bilateral 01/29/2018   Procedure: TOTAL KNEE BILATERAL;  Surgeon: Gaynelle Arabian, MD;  Location: [facility];  Service: Orthopedics;  Laterality: Bilateral;    Current Medications: Current Meds  Medication Sig  . acetaminophen-codeine (TYLENOL #3) 300-30 MG tablet Take 1-2 tablets by mouth every 4 (four) hours as needed for moderate pain.  Marland Kitchen aspirin EC 81 MG tablet Take 1 tablet (81 mg total) by mouth daily.  . Coenzyme Q10 (COQ10) 200 MG CAPS Take 200 mg by mouth daily.  . LUTEIN PO Take 1 capsule by mouth daily.  . metoprolol succinate (TOPROL-XL) 25 MG 24 hr tablet Take 1 tablet (25 mg total) by mouth daily.  . Misc Natural Products (GLUCOSAMINE CHOND MSM FORMULA PO) Take 1 tablet by mouth daily.  . Multiple Vitamins-Minerals (MULTIVITAMIN PO) Take 1 tablet by mouth daily.  Marland Kitchen olmesartan (BENICAR) 40 MG tablet Take 1 tablet (40 mg total) by mouth daily.  . rosuvastatin (CRESTOR) 5 MG tablet Take 1 tablet by mouth every Monday, Wednesday and Friday.  . Vitamin D, Cholecalciferol, 10 MCG (400 UNIT) TABS Take 1 tablet by mouth  daily.  . vitamin E 400 UNIT capsule Take 400 Units by mouth daily.  . [DISCONTINUED] metoprolol succinate (TOPROL-XL) 25 MG 24 hr tablet Take 1 tablet (25 mg total) by mouth daily. Pt needs to make appt with provider for further refills -2nd attempt  . [DISCONTINUED] olmesartan (BENICAR) 40 MG tablet Take 1 tablet (40 mg total) by mouth daily. Pt needs to make appt with provider for further refills - 3rd attempt  . [DISCONTINUED] rosuvastatin (CRESTOR) 5 MG tablet Take 1 tablet by mouth every Monday, Wednesday and Friday. Please make overdue appt with Dr. Tamala Julian  before anymore refills. 2nd attempt     Allergies:   Bee venom, Epinephrine, Atorvastatin, Hydrocodone, Molds & smuts, Other, Oxycodone, Tramadol, Hydrochlorothiazide, and Lisinopril   Social History   Socioeconomic History  . Marital status: Widowed    Spouse name: Not on file  . Number of children: Not on file  . Years of education: Not on file  . Highest education level: Not on file  Occupational History  . Not on file  Tobacco Use  . Smoking status: Never Smoker  . Smokeless tobacco: Never Used  Substance and Sexual Activity  . Alcohol use: Not Currently    Comment: rare  . Drug use: No  . Sexual activity: Not Currently  Other Topics Concern  . Not on file  Social History Narrative  . Not on file   Social Determinants of Health   Financial Resource Strain:   . Difficulty of Paying Living Expenses:   Food Insecurity:   . Worried About Charity fundraiser in the Last Year:   . Arboriculturist in the Last Year:   Transportation Needs:   . Film/video editor (Medical):   Marland Kitchen Lack of Transportation (Non-Medical):   Physical Activity:   . Days of Exercise per Week:   . Minutes of Exercise per Session:   Stress:   . Feeling of Stress :   Social Connections:   . Frequency of Communication with Friends and Family:   . Frequency of Social Gatherings with Friends and Family:   . Attends Religious Services:   . Active Member of Clubs or Organizations:   . Attends Archivist Meetings:   Marland Kitchen Marital Status:      Family History: The patient's family history includes Cancer in her mother; Glaucoma in her sister; Heart disease in her father; Hypertension in her mother; Lung disease in her father; Thyroid disease in her mother.  ROS:   Please see the history of present illness.    Discerned about her family history of vascular disease.  Bilateral knee replacements have progressively improved.  No pain currently.  Has gained weight during the pandemic.  Has been  vaccinated.  Sleeps well.  No recent laboratory data.  All other systems reviewed and are negative.  EKGs/Labs/Other Studies Reviewed:    The following studies were reviewed today: No recent imaging or functional data.  EKG:  EKG normal sinus rhythm, incomplete right bundle branch block, QS pattern V1 through V3.  Left axis deviation.  When compared to her prior tracing from 2019, no significant change has occurred.  Recent Labs: No results found for requested labs within last 8760 hours.  Recent Lipid Panel    Component Value Date/Time   CHOL 170 07/08/2018 0744   TRIG 73 07/08/2018 0744   HDL 62 07/08/2018 0744   CHOLHDL 2.7 07/08/2018 0744   LDLCALC 93 07/08/2018 0744    Physical  Exam:    VS:  BP (!) 142/86   Pulse 66   Ht 5\' 3"  (1.6 m)   Wt 180 lb 12.8 oz (82 kg)   SpO2 96%   BMI 32.03 kg/m     Wt Readings from Last 3 Encounters:  02/05/20 180 lb 12.8 oz (82 kg)  12/10/18 173 lb (78.5 kg)  12/02/18 173 lb (78.5 kg)     GEN: Moderate obesity. No acute distress HEENT: Normal NECK: No JVD. LYMPHATICS: No lymphadenopathy CARDIAC:  RRR without murmur, gallop, or edema. VASCULAR:  Normal Pulses. No bruits. RESPIRATORY:  Clear to auscultation without rales, wheezing or rhonchi  ABDOMEN: Soft, non-tender, non-distended, No pulsatile mass, MUSCULOSKELETAL: No deformity  SKIN: Warm and dry NEUROLOGIC:  Alert and oriented x 3 PSYCHIATRIC:  Normal affect   ASSESSMENT:    1. Mixed hyperlipidemia   2. Essential hypertension, benign   3. Abnormal EKG   4. Educated about COVID-19 virus infection    PLAN:    In order of problems listed above:  1. Lipid panel fasting.  We would love to have LDL cholesterol less than 100 and if she develops additional risk factors or evidence of atherosclerosis on any imaging, target would then be less than 70. 2. Elevated.  Add HCTZ 12.5 mg on Monday, Wednesday, and Friday.  Additionally, recommended avoiding nonsteroidals, moderate  physical activity, weight loss, and low-salt diet. 3. Unchanged. 4. She has been vaccinated.  She will continue to practice the 3W's.  Overall education and awareness concerning primary risk prevention was discussed in detail: LDL less than 70, hemoglobin A1c less than 7, blood pressure target less than 130/80 mmHg, >150 minutes of moderate aerobic activity per week, avoidance of smoking, weight control (via diet and exercise), and continued surveillance/management of/for obstructive sleep apnea.    Medication Adjustments/Labs and Tests Ordered: Current medicines are reviewed at length with the patient today.  Concerns regarding medicines are outlined above.  Orders Placed This Encounter  Procedures  . Basic metabolic panel  . Lipid panel  . Hepatic function panel  . EKG 12-Lead   Meds ordered this encounter  Medications  . rosuvastatin (CRESTOR) 5 MG tablet    Sig: Take 1 tablet by mouth every Monday, Wednesday and Friday.    Dispense:  90 tablet    Refill:  3  . olmesartan (BENICAR) 40 MG tablet    Sig: Take 1 tablet (40 mg total) by mouth daily.    Dispense:  90 tablet    Refill:  3  . metoprolol succinate (TOPROL-XL) 25 MG 24 hr tablet    Sig: Take 1 tablet (25 mg total) by mouth daily.    Dispense:  90 tablet    Refill:  3  . hydrochlorothiazide (MICROZIDE) 12.5 MG capsule    Sig: Take 1 capsule (12.5 mg total) by mouth every Monday, Wednesday, and Friday.    Dispense:  36 capsule    Refill:  3    Patient Instructions  Medication Instructions:  1) START Hydrochlorothiazide 12.5mg  once daily on Monday, Wednesday and Friday.  Monitor your blood pressure at least 2 times per week and record those readings.  Check it at least 2 hours after your medication.  If your top number is consistently above 130, please let us know.   *If you need a refill on your cardiac medications before your next appointment, please call your pharmacy*   Lab Work: BMET, Lipid and Liver when you  are fasting (nothing to eat  or drink after midnight except water and black coffee).  If you have labs (blood work) drawn today and your tests are completely normal, you will receive your results only by: Marland Kitchen MyChart Message (if you have MyChart) OR . A paper copy in the mail If you have any lab test that is abnormal or we need to change your treatment, we will call you to review the results.   Testing/Procedures: None   Follow-Up: At [facility], you and your health needs are our priority.  As part of our continuing mission to provide you with exceptional heart care, we have created designated Provider Care Teams.  These Care Teams include your primary Cardiologist (physician) and Advanced Practice Providers (APPs -  Physician Assistants and Nurse Practitioners) who all work together to provide you with the care you need, when you need it.  We recommend signing up for the patient portal called "MyChart".  Sign up information is provided on this After Visit Summary.  MyChart is used to connect with patients for Virtual Visits (Telemedicine).  Patients are able to view lab/test results, encounter notes, upcoming appointments, etc.  Non-urgent messages can be sent to your provider as well.   To learn more about what you can do with MyChart, go to [email or web address].    Your next appointment:   12 month(s)  The format for your next appointment:   In Person  Provider:   You may see Sinclair Grooms, MD or one of the following Advanced Practice Providers on your designated Care Team:    Truitt Merle, NP  Cecilie Kicks, NP  Kathyrn Drown, NP    Other Instructions      Signed, Sinclair Grooms, MD  02/05/2020 10:36 AM    [place]

## 2020-02-05 ENCOUNTER — Encounter: Payer: Self-pay | Admitting: Interventional Cardiology

## 2020-02-05 ENCOUNTER — Ambulatory Visit: Payer: Medicare Other | Admitting: Interventional Cardiology

## 2020-02-05 ENCOUNTER — Other Ambulatory Visit: Payer: Self-pay

## 2020-02-05 VITALS — BP 142/86 | HR 66 | Ht 63.0 in | Wt 180.8 lb

## 2020-02-05 DIAGNOSIS — I10 Essential (primary) hypertension: Secondary | ICD-10-CM | POA: Diagnosis not present

## 2020-02-05 DIAGNOSIS — E782 Mixed hyperlipidemia: Secondary | ICD-10-CM

## 2020-02-05 DIAGNOSIS — R9431 Abnormal electrocardiogram [ECG] [EKG]: Secondary | ICD-10-CM | POA: Diagnosis not present

## 2020-02-05 DIAGNOSIS — Z7189 Other specified counseling: Secondary | ICD-10-CM

## 2020-02-05 MED ORDER — METOPROLOL SUCCINATE ER 25 MG PO TB24: 25.0000 mg | ORAL_TABLET | Freq: Every day | ORAL | 3 refills | Status: DC

## 2020-02-05 MED ORDER — HYDROCHLOROTHIAZIDE 12.5 MG PO CAPS: 12.5000 mg | ORAL_CAPSULE | ORAL | 3 refills | Status: AC

## 2020-02-05 MED ORDER — OLMESARTAN MEDOXOMIL 40 MG PO TABS: 40.0000 mg | ORAL_TABLET | Freq: Every day | ORAL | 3 refills | Status: DC

## 2020-02-05 MED ORDER — ROSUVASTATIN CALCIUM 5 MG PO TABS: ORAL_TABLET | ORAL | 3 refills | Status: DC

## 2020-02-05 NOTE — Patient Instructions (Signed)
Medication Instructions:  1) START Hydrochlorothiazide 12.5mg  once daily on Monday, Wednesday and Friday.  Monitor your blood pressure at least 2 times per week and record those readings.  Check it at least 2 hours after your medication.  If your top number is consistently above 130, please let us know.   *If you need a refill on your cardiac medications before your next appointment, please call your pharmacy*   Lab Work: BMET, Lipid and Liver when you are fasting (nothing to eat or drink after midnight except water and black coffee).  If you have labs (blood work) drawn today and your tests are completely normal, you will receive your results only by: Marland Kitchen MyChart Message (if you have MyChart) OR . A paper copy in the mail If you have any lab test that is abnormal or we need to change your treatment, we will call you to review the results.   Testing/Procedures: None   Follow-Up: At [facility], you and your health needs are our priority.  As part of our continuing mission to provide you with exceptional heart care, we have created designated Provider Care Teams.  These Care Teams include your primary Cardiologist (physician) and Advanced Practice Providers (APPs -  Physician Assistants and Nurse Practitioners) who all work together to provide you with the care you need, when you need it.  We recommend signing up for the patient portal called "MyChart".  Sign up information is provided on this After Visit Summary.  MyChart is used to connect with patients for Virtual Visits (Telemedicine).  Patients are able to view lab/test results, encounter notes, upcoming appointments, etc.  Non-urgent messages can be sent to your provider as well.   To learn more about what you can do with MyChart, go to [email or web address].    Your next appointment:   12 month(s)  The format for your next appointment:   In Person  Provider:   You may see Sinclair Grooms, MD or one of the following Advanced  Practice Providers on your designated Care Team:    Truitt Merle, NP  Cecilie Kicks, NP  Kathyrn Drown, NP    Other Instructions

## 2020-02-15 ENCOUNTER — Other Ambulatory Visit: Payer: Medicare Other | Admitting: *Deleted

## 2020-02-15 ENCOUNTER — Other Ambulatory Visit: Payer: Self-pay

## 2020-02-15 DIAGNOSIS — E782 Mixed hyperlipidemia: Secondary | ICD-10-CM

## 2020-02-15 DIAGNOSIS — I10 Essential (primary) hypertension: Secondary | ICD-10-CM

## 2020-02-15 LAB — BASIC METABOLIC PANEL
BUN/Creatinine Ratio: 20 (ref 12–28)
BUN: 17 mg/dL (ref 8–27)
CO2: 24 mmol/L (ref 20–29)
Calcium: 9.6 mg/dL (ref 8.7–10.3)
Chloride: 104 mmol/L (ref 96–106)
Creatinine, Ser: 0.84 mg/dL (ref 0.57–1.00)
GFR calc Af Amer: 79 mL/min/{1.73_m2} (ref >59)
GFR calc non Af Amer: 69 mL/min/{1.73_m2} (ref >59)
Glucose: 96 mg/dL (ref 65–99)
Potassium: 4.4 mmol/L (ref 3.5–5.2)
Sodium: 141 mmol/L (ref 134–144)

## 2020-02-15 LAB — HEPATIC FUNCTION PANEL
ALT: 20 IU/L (ref 0–32)
AST: 23 IU/L (ref 0–40)
Albumin: 4.3 g/dL (ref 3.7–4.7)
Alkaline Phosphatase: 97 IU/L (ref 39–117)
Bilirubin Total: 0.6 mg/dL (ref 0.0–1.2)
Bilirubin, Direct: 0.18 mg/dL (ref 0.00–0.40)
Total Protein: 6.7 g/dL (ref 6.0–8.5)

## 2020-02-15 LAB — LIPID PANEL
Chol/HDL Ratio: 2.8 ratio (ref 0.0–4.4)
Cholesterol, Total: 153 mg/dL (ref 100–199)
HDL: 55 mg/dL (ref >39)
LDL Chol Calc (NIH): 80 mg/dL (ref 0–99)
Triglycerides: 100 mg/dL (ref 0–149)
VLDL Cholesterol Cal: 18 mg/dL (ref 5–40)

## 2020-11-06 ENCOUNTER — Other Ambulatory Visit: Payer: Self-pay | Admitting: Physician Assistant

## 2021-06-30 ENCOUNTER — Encounter: Payer: Self-pay | Admitting: Obstetrics & Gynecology

## 2021-06-30 DIAGNOSIS — Z1231 Encounter for screening mammogram for malignant neoplasm of breast: Secondary | ICD-10-CM

## 2021-07-03 ENCOUNTER — Ambulatory Visit: Payer: No Typology Code available for payment source

## 2021-07-04 ENCOUNTER — Telehealth: Payer: Self-pay

## 2021-07-04 NOTE — Telephone Encounter (Signed)
MYO RECEIVED AND SCANNED TO 8.9.22 SCAN ONLY

## 2021-08-15 ENCOUNTER — Ambulatory Visit
Admission: RE | Admit: 2021-08-15 | Discharge: 2021-08-15 | Disposition: A | Discharge status: Home / Self Care | Payer: No Typology Code available for payment source | Source: Ambulatory Visit | Attending: Obstetrics & Gynecology | Admitting: Obstetrics & Gynecology

## 2021-08-15 DIAGNOSIS — Z1231 Encounter for screening mammogram for malignant neoplasm of breast: Secondary | ICD-10-CM

## 2021-08-15 HISTORY — DX: Malignant neoplasm of endometrium: C54.1

## 2021-08-16 ENCOUNTER — Ambulatory Visit: Payer: No Typology Code available for payment source

## 2021-08-29 ENCOUNTER — Other Ambulatory Visit: Payer: Self-pay | Admitting: Interventional Cardiology

## 2021-08-29 DIAGNOSIS — I10 Essential (primary) hypertension: Secondary | ICD-10-CM

## 2021-08-30 MED ORDER — OLMESARTAN MEDOXOMIL 40 MG PO TABS: 40.0000 mg | ORAL_TABLET | Freq: Every day | ORAL | 0 refills | Status: AC

## 2021-08-30 MED ORDER — METOPROLOL SUCCINATE ER 25 MG PO TB24: 25.0000 mg | ORAL_TABLET | Freq: Every day | ORAL | 0 refills | Status: AC

## 2021-09-05 ENCOUNTER — Ambulatory Visit
Admission: RE | Admit: 2021-09-05 | Discharge: 2021-09-05 | Disposition: A | Discharge status: Home / Self Care | Payer: Self-pay | Source: Ambulatory Visit | Attending: Surgery | Admitting: Surgery

## 2021-09-05 ENCOUNTER — Other Ambulatory Visit: Payer: Self-pay | Admitting: Surgery

## 2021-09-05 ENCOUNTER — Ambulatory Visit: Admission: RE | Admit: 2021-09-05 | Payer: Self-pay | Source: Ambulatory Visit

## 2021-09-05 ENCOUNTER — Other Ambulatory Visit: Payer: Self-pay | Admitting: Obstetrics & Gynecology

## 2021-09-28 ENCOUNTER — Telehealth: Payer: Self-pay

## 2021-09-28 NOTE — Telephone Encounter (Signed)
Previous Cardiology is in Care Everywhere -  Christine Little

## 2021-10-29 ENCOUNTER — Emergency Department
Admission: EM | Admit: 2021-10-29 | Discharge: 2021-10-29 | Disposition: A | Discharge status: Home / Self Care | Payer: No Typology Code available for payment source | Attending: Emergency Medicine | Admitting: Emergency Medicine

## 2021-10-29 ENCOUNTER — Emergency Department: Payer: No Typology Code available for payment source

## 2021-10-29 DIAGNOSIS — R059 Cough, unspecified: Secondary | ICD-10-CM

## 2021-10-29 DIAGNOSIS — I10 Essential (primary) hypertension: Secondary | ICD-10-CM | POA: Insufficient documentation

## 2021-10-29 DIAGNOSIS — U071 COVID-19: Secondary | ICD-10-CM | POA: Insufficient documentation

## 2021-10-29 DIAGNOSIS — R42 Dizziness and giddiness: Secondary | ICD-10-CM | POA: Insufficient documentation

## 2021-10-29 DIAGNOSIS — R0981 Nasal congestion: Secondary | ICD-10-CM | POA: Insufficient documentation

## 2021-10-29 DIAGNOSIS — R197 Diarrhea, unspecified: Secondary | ICD-10-CM | POA: Insufficient documentation

## 2021-10-29 LAB — COMPREHENSIVE METABOLIC PANEL
ALT: 20 U/L (ref 0–55)
AST (SGOT): 17 U/L (ref 10–42)
Albumin/Globulin Ratio: 1.06 Ratio (ref 0.80–2.00)
Albumin: 3.6 gm/dL (ref 3.5–5.0)
Alkaline Phosphatase: 84 U/L (ref 40–145)
Anion Gap: 14.8 mMol/L (ref 7.0–18.0)
BUN / Creatinine Ratio: 22.4 Ratio (ref 10.0–30.0)
BUN: 19 mg/dL (ref 7–22)
Bilirubin, Total: 0.6 mg/dL (ref 0.1–1.2)
CO2: 22 mMol/L (ref 20–30)
Calcium: 10.4 mg/dL (ref 8.5–10.5)
Chloride: 104 mMol/L (ref 98–110)
Creatinine: 0.85 mg/dL (ref 0.60–1.20)
EGFR: 71 mL/min/{1.73_m2} (ref 60–150)
Globulin: 3.4 gm/dL (ref 2.0–4.0)
Glucose: 143 mg/dL — ABNORMAL HIGH (ref 71–99)
Osmolality Calculated: 279 mOsm/kg (ref 275–300)
Potassium: 3.8 mMol/L (ref 3.5–5.3)
Protein, Total: 7 gm/dL (ref 6.0–8.3)
Sodium: 137 mMol/L (ref 136–147)

## 2021-10-29 LAB — CBC AND DIFFERENTIAL
Basophils %: 0.2 % (ref 0.0–3.0)
Basophils Absolute: 0 10*3/uL (ref 0.0–0.3)
Eosinophils %: 0.4 % (ref 0.0–7.0)
Eosinophils Absolute: 0 10*3/uL (ref 0.0–0.8)
Hematocrit: 45.7 % (ref 36.0–48.0)
Hemoglobin: 14.5 gm/dL (ref 12.0–16.0)
Lymphocytes Absolute: 0.7 10*3/uL (ref 0.6–5.1)
Lymphocytes: 11.9 % — ABNORMAL LOW (ref 15.0–46.0)
MCH: 29 pg (ref 28–35)
MCHC: 32 gm/dL (ref 32–36)
MCV: 91 fL (ref 80–100)
MPV: 8.1 fL (ref 6.0–10.0)
Monocytes Absolute: 0.5 10*3/uL (ref 0.1–1.7)
Monocytes: 7.5 % (ref 3.0–15.0)
Neutrophils %: 80 % — ABNORMAL HIGH (ref 42.0–78.0)
Neutrophils Absolute: 5 10*3/uL (ref 1.7–8.6)
PLT CT: 202 10*3/uL (ref 130–440)
RBC: 5.01 10*6/uL — ABNORMAL HIGH (ref 3.80–5.00)
RDW: 12.7 % (ref 11.0–14.0)
WBC: 6.2 10*3/uL (ref 4.0–11.0)

## 2021-10-29 LAB — MAGNESIUM: Magnesium: 1.7 mg/dL (ref 1.6–2.6)

## 2021-10-29 LAB — TROPONIN I: Troponin I: 0 ng/mL (ref 0.00–0.02)

## 2021-10-29 MED ORDER — VH SODIUM CHLORIDE 0.9 % IV BOLUS
250.0000 mL | Freq: Once | INTRAVENOUS | Status: AC
Start: 2021-10-29 — End: 2021-10-29
  Administered 2021-10-29: 250 mL via INTRAVENOUS

## 2021-10-29 MED ORDER — ALBUTEROL SULFATE HFA 108 (90 BASE) MCG/ACT IN AERS
1.0000 | INHALATION_SPRAY | RESPIRATORY_TRACT | 0 refills | Status: DC | PRN
Start: 2021-10-29 — End: 2021-11-13

## 2021-10-29 MED ORDER — ZINC SULFATE 220 (50 ZN) MG PO CAPS
220.0000 mg | ORAL_CAPSULE | Freq: Every day | ORAL | 0 refills | Status: DC
Start: 2021-10-29 — End: 2021-11-13

## 2021-10-29 MED ORDER — CLONIDINE HCL 0.1 MG PO TABS
0.1000 mg | ORAL_TABLET | Freq: Three times a day (TID) | ORAL | 0 refills | Status: DC | PRN
Start: 2021-10-29 — End: 2022-01-17

## 2021-10-29 MED ORDER — VITAMIN D (ERGOCALCIFEROL) 1.25 MG (50000 UT) PO CAPS
50000.0000 [IU] | ORAL_CAPSULE | ORAL | 0 refills | Status: AC
Start: 2021-10-29 — End: 2021-11-06

## 2021-10-29 MED ORDER — PAXLOVID (300/100) 20 X 150 MG & 10 X 100MG PO TBPK
3.0000 | ORAL_TABLET | Freq: Two times a day (BID) | ORAL | 0 refills | Status: AC
Start: 2021-10-29 — End: 2021-11-03

## 2021-10-29 MED ORDER — ALBUTEROL SULFATE HFA 108 (90 BASE) MCG/ACT IN AERS
INHALATION_SPRAY | RESPIRATORY_TRACT | Status: AC
Start: 2021-10-29 — End: ?
  Filled 2021-10-29: qty 18

## 2021-10-29 MED ORDER — ASCORBIC ACID 500 MG PO TABS
500.0000 mg | ORAL_TABLET | Freq: Every day | ORAL | 0 refills | Status: AC
Start: 2021-10-29 — End: 2021-11-12

## 2021-10-29 MED ORDER — VH SODIUM CHLORIDE 0.9 % IV BOLUS
500.0000 mL | Freq: Once | INTRAVENOUS | Status: DC
Start: 2021-10-29 — End: 2021-10-29

## 2021-10-29 NOTE — Discharge Instructions (Addendum)
Please hold your statin for the duration of paxlovid.

## 2021-10-29 NOTE — ED Notes (Signed)
Instructed in use of pulse ox. Given IS and instructed in use, gets to 1750 cc with fair technique. Given albuterol inhaler and spacer and shown use, has good technique.

## 2021-10-29 NOTE — ED Provider Notes (Signed)
History     Chief Complaint   Patient presents with    Dizziness     HPI     75 year old female presents emergency department stating that she has had URI symptoms since yesterday and she checked for her COVID which was in her words " distinctly positive."  She says that she also noticed that her blood pressure was uncontrolled and reported some dizziness at the time.  Also reports an episode of diarrhea. This led the patient to present to the emergency department for eval.  Patient did take her blood pressure medication today which included olmesartan and metoprolol.  Current BP is 141/88.    Past Medical History:   Diagnosis Date    Primary malignant neoplasm of endometrium        Past Surgical History:   Procedure Laterality Date    HYSTERECTOMY         Family History   Problem Relation Age of Onset    Breast cancer Mother     Breast cancer Maternal Aunt        Social       .     Allergies   Allergen Reactions    Epinephrine        Home Medications       None on File             Review of Systems   HENT:  Positive for congestion.    Respiratory:  Positive for cough.    Cardiovascular:  Negative for chest pain.   Gastrointestinal:  Positive for diarrhea. Negative for vomiting.   Musculoskeletal:  Positive for arthralgias and myalgias.   All other systems reviewed and are negative.    Physical Exam    BP: 156/80, Heart Rate: 76, Temp: 98.6 F (37 C), Resp Rate: 20, SpO2: 96 %, Weight: 88.5 kg    Physical Exam  Const: Active, Alert, No acute distress  Head: NC/AT  Eyes: PERRL, No conjunctival injection No secretions  ENMT: External nose and ears atraumatic. Dry MM. No pharyngeal erythema/exudates  Neck: Supple, Trachea midline, No JVD.  CVS: Regular rate and rhythm. No murmurs or gallops  Resp: Unlabored respiratory effort. Clear to auscultation bilaterally.  GI: Nondistended. No TTP. No guarding/rebound, BS+  MSK: Extremities w/o deformity. No tenderness. No cyanosis or clubbing. No edema.  Skin: Warm, Dry.  No rashes or lesions.  Neuro: Cns II-XII grossly intact. Sensation/Motor grossly intact.  Psych: Appropriate mood and affect.      MDM and ED Course     ED Medication Orders (From admission, onward)      Start Ordered     Status Ordering Provider    10/29/21 1630 10/29/21 1618    Once in ED        Route: Intravenous  Ordered Dose: 500 mL     Discontinued Swade Shonka    10/29/21 1630 10/29/21 1621  sodium chloride 0.9 % bolus 250 mL  Once in ED        Route: Intravenous  Ordered Dose: 250 mL     Thermon Leyland, Jarah Pember               MDM                   Procedures    Clinical Impression & Disposition     Clinical Impression  Final diagnoses:   None        ED Disposition  None             New Prescriptions    No medications on file

## 2021-10-30 LAB — ECG 12-LEAD
P Wave Axis: -2 deg
P-R Interval: 187 ms
Patient Age: 75 years
Q-T Interval(Corrected): 462 ms
Q-T Interval: 408 ms
QRS Axis: -59 deg
QRS Duration: 94 ms
T Axis: 4 years
Ventricular Rate: 77 //min

## 2021-11-13 ENCOUNTER — Encounter (RURAL_HEALTH_CENTER): Payer: Self-pay | Admitting: Family Medicine

## 2021-11-13 ENCOUNTER — Ambulatory Visit: Payer: No Typology Code available for payment source | Attending: Family Medicine | Admitting: Family Medicine

## 2021-11-13 VITALS — BP 156/84 | HR 73 | Temp 97.2°F | Resp 16 | Ht 63.0 in | Wt 176.0 lb

## 2021-11-13 DIAGNOSIS — I10 Essential (primary) hypertension: Secondary | ICD-10-CM

## 2021-11-13 DIAGNOSIS — M25521 Pain in right elbow: Secondary | ICD-10-CM

## 2021-11-13 DIAGNOSIS — L989 Disorder of the skin and subcutaneous tissue, unspecified: Secondary | ICD-10-CM

## 2021-11-13 MED ORDER — ROSUVASTATIN CALCIUM 5 MG PO TABS
ORAL_TABLET | ORAL | 1 refills | Status: DC
Start: ? — End: 2021-11-13

## 2021-11-13 MED ORDER — METOPROLOL SUCCINATE ER 50 MG PO TB24
50.0000 mg | ORAL_TABLET | Freq: Every day | ORAL | 1 refills | Status: DC
Start: ? — End: 2021-11-13

## 2021-11-13 MED ORDER — OLMESARTAN MEDOXOMIL 40 MG PO TABS
ORAL_TABLET | ORAL | 1 refills | Status: DC
Start: ? — End: 2021-11-13

## 2021-11-13 NOTE — Addendum Note (Signed)
Addended by: Francisca December on: 11/13/2021 02:23 PM     Modules accepted: Orders

## 2021-11-13 NOTE — Progress Notes (Signed)
Progress Note      Patient Name:  Christine Little    Subjective/HPI Comments:    Patient is a  75 y.o. female who presents with pseudo bursa right elbow, wants referral to dermatology, and hypertension.  Chief Complaint   Patient presents with    Establish Care     Needs meds refilled, needs them sent to Pharmacy in [place]        Outpatient Medications Marked as Taking for the 11/13/21 encounter (Office Visit) with Daylyn Azbill, Hipolito Bayley, MD   Medication Sig Dispense Refill    amoxicillin (AMOXIL) 500 MG capsule amoxicillin 500 mg capsule   TAKE 4 CAPSULES BY MOUTH 1 HOUR PRIOR TO DENTAL VISIT      aspirin 81 MG EC tablet aspirin 81 mg tablet,delayed release      cloNIDine (CATAPRES) 0.1 MG tablet Take 1 tablet (0.1 mg) by mouth 3 (three) times daily as needed (If BP>160/43mmHg) 15 tablet 0    mometasone (ELOCON) 0.1 % ointment       Multiple Vitamin (Thera) Tab Take 1 tablet by mouth daily      [DISCONTINUED] metoprolol succinate XL (TOPROL-XL) 25 MG 24 hr tablet Take 25 mg by mouth      [DISCONTINUED] olmesartan (BENICAR) 40 MG tablet TAKE 1 TABLET BY MOUTH ONCE DAILY. PLEASE MAKE OVERDUE APPOINTMENT WITH DOCTOR SMITH      [DISCONTINUED] rosuvastatin (CRESTOR) 5 MG tablet rosuvastatin 5 mg tablet   TAKE ONE TABLET BY MOUTH ON MONDAY, WEDNESDAY, AND FRIDAY.( MAKE YEARLY APPOINTMENT WITH DR. Katrinka Blazing FOR FUTURE REFILLS)      [DISCONTINUED] zinc sulfate (ZINCATE) 220 (50 Zn) MG capsule Take 1 capsule (220 mg) by mouth daily 14 capsule 0     Allergies   Allergen Reactions    Bee Venom Anaphylaxis    Tobacco Anaphylaxis, Dizziness, Respiratory Distress and Shortness Of Breath    Atorvastatin Muscle Pain and Other (See Comments)     Body pain, loss of sleep  Unknown      Epinephrine     Hydrocodone Nausea Only    Molds & Smuts Other (See Comments) and Nausea And Vomiting     Severe headache and tearing of the eyes  Severe headache and tearing of the eyes      Oxycodone Nausea Only    Powder Scent Fragrance     Tramadol  Other (See Comments) and Nausea And Vomiting     Unknown  Unknown      Hydrochlorothiazide Anxiety    Lisinopril Anxiety     Past Medical History:   Diagnosis Date    Primary malignant neoplasm of endometrium      Past Surgical History:   Procedure Laterality Date    CHOLECYSTECTOMY  11/2017    HYSTERECTOMY      TOTAL KNEE ARTHROPLASTY Bilateral 04/2017     Social History     Occupational History    Not on file   Tobacco Use    Smoking status: Never    Smokeless tobacco: Never   Vaping Use    Vaping Use: Never used   Substance and Sexual Activity    Alcohol use: Never    Drug use: Never    Sexual activity: Not on file     Family History   Problem Relation Age of Onset    Heart disease Mother     Cancer Mother     Breast cancer Mother     Heart disease Father     Cancer  Maternal Aunt     Breast cancer Maternal Aunt     Heart disease Maternal Grandmother     Diabetes Maternal Grandmother     Heart disease Maternal Grandfather     Heart disease Paternal Grandmother     Heart disease Paternal Grandfather        Review of Systems:  Review of Systems   Constitutional:  Negative for chills, diaphoresis, fever, malaise/fatigue and weight loss.   HENT:  Negative for congestion, ear discharge, ear pain, sinus pain and sore throat.    Eyes:  Negative for blurred vision, pain, discharge and redness.   Respiratory:  Negative for cough, shortness of breath and wheezing.    Cardiovascular:  Negative for chest pain, palpitations and orthopnea.   Gastrointestinal:  Negative for abdominal pain, diarrhea, nausea and vomiting.   Genitourinary:  Negative for dysuria, frequency and urgency.   Musculoskeletal:  Negative for joint pain and myalgias.        Pseudobursa right elbow   Skin:  Negative for rash.   Neurological:  Negative for dizziness, tingling, weakness and headaches.   Endo/Heme/Allergies:  Negative for environmental allergies and polydipsia. Does not bruise/bleed easily.   Psychiatric/Behavioral:  Negative for depression and  suicidal ideas. The patient is not nervous/anxious and does not have insomnia.      Objective/ Physical Exam:  Vitals:    11/13/21 1319   BP: 156/84   BP Site: Left arm   Patient Position: Sitting   Cuff Size: Medium   Pulse: 73   Resp: 16   Temp: 97.2 F (36.2 C)   TempSrc: Temporal   SpO2: 97%   Weight: 79.8 kg (176 lb)   Height: 1.6 m (5\' 3" )     Body mass index is 31.18 kg/m.  Pain Score: 3-mild pain    Physical Exam  Vitals reviewed.   Constitutional:       General: She is not in acute distress.     Appearance: She is well-developed. She is not diaphoretic.   HENT:      Head: Normocephalic and atraumatic.      Right Ear: External ear normal.      Left Ear: External ear normal.      Nose: Nose normal.   Eyes:      Conjunctiva/sclera: Conjunctivae normal.      Pupils: Pupils are equal, round, and reactive to light.   Neck:      Vascular: No carotid bruit.   Cardiovascular:      Rate and Rhythm: Normal rate and regular rhythm.      Heart sounds: Normal heart sounds. No murmur heard.    No friction rub. No gallop.   Pulmonary:      Effort: Pulmonary effort is normal. No respiratory distress.      Breath sounds: Normal breath sounds. No wheezing or rales.   Abdominal:      General: Bowel sounds are normal. There is no distension.      Palpations: Abdomen is soft.      Tenderness: There is no abdominal tenderness.   Musculoskeletal:         General: Normal range of motion.      Comments: Pseudo bursa right elbow   Skin:     General: Skin is warm and dry.   Neurological:      Mental Status: She is alert and oriented to person, place, and time.   Psychiatric:         Behavior: Behavior  normal.         Thought Content: Thought content normal.         Judgment: Judgment normal.     Assessment:    1. Right elbow pain    2. Dermatologic problem    3. Primary hypertension        Plan:  Orders Placed This Encounter   Procedures    Hemoglobin A1C     Standing Status:   Future     Standing Expiration Date:   11/13/2022      Order Specific Question:   Release to patient     Answer:   Immediate    Lipid panel     Standing Status:   Future     Standing Expiration Date:   11/13/2022     Order Specific Question:   Has the patient fasted?     Answer:   Yes     Order Specific Question:   Release to patient     Answer:   Immediate    Microalbumin / Creatinine Ratio     Standing Status:   Future     Standing Expiration Date:   11/13/2022     Order Specific Question:   Release to patient     Answer:   Immediate    TSH, Abn Reflex to Free T4, Serum     Standing Status:   Future     Standing Expiration Date:   11/13/2022     Order Specific Question:   Release to patient     Answer:   Immediate    Ambulatory referral to Orthopedic Surgery     Referral Priority:   Routine     Referral Type:   Consultation     Referral Reason:   Specialty Services Required     Requested Specialty:   Orthopaedic Surgery     Number of Visits Requested:   1    Ambulatory referral to Dermatology     Referral Priority:   Routine     Referral Type:   Consultation     Referral Reason:   Specialty Services Required     Requested Specialty:   Dermatology     Number of Visits Requested:   1     1. Right elbow pain  - Ambulatory referral to Orthopedic Surgery  - Hemoglobin A1C; Future  - Lipid panel; Future  - Microalbumin / Creatinine Ratio; Future  - TSH, Abn Reflex to Free T4, Serum; Future    2. Dermatologic problem  - Ambulatory referral to Dermatology  - Hemoglobin A1C; Future  - Lipid panel; Future  - Microalbumin / Creatinine Ratio; Future  - TSH, Abn Reflex to Free T4, Serum; Future    3. Primary hypertension  - Hemoglobin A1C; Future  - Lipid panel; Future  - Microalbumin / Creatinine Ratio; Future  - TSH, Abn Reflex to Free T4, Serum; Future    Outpatient Encounter Medications as of 11/13/2021   Medication Sig Dispense Refill    amoxicillin (AMOXIL) 500 MG capsule amoxicillin 500 mg capsule   TAKE 4 CAPSULES BY MOUTH 1 HOUR PRIOR TO DENTAL VISIT      aspirin 81 MG EC  tablet aspirin 81 mg tablet,delayed release      cloNIDine (CATAPRES) 0.1 MG tablet Take 1 tablet (0.1 mg) by mouth 3 (three) times daily as needed (If BP>160/44mmHg) 15 tablet 0    mometasone (ELOCON) 0.1 % ointment       Multiple Vitamin (Thera) Tab  Take 1 tablet by mouth daily      [DISCONTINUED] metoprolol succinate XL (TOPROL-XL) 25 MG 24 hr tablet Take 25 mg by mouth      [DISCONTINUED] olmesartan (BENICAR) 40 MG tablet TAKE 1 TABLET BY MOUTH ONCE DAILY. PLEASE MAKE OVERDUE APPOINTMENT WITH DOCTOR SMITH      [DISCONTINUED] rosuvastatin (CRESTOR) 5 MG tablet rosuvastatin 5 mg tablet   TAKE ONE TABLET BY MOUTH ON MONDAY, WEDNESDAY, AND FRIDAY.( MAKE YEARLY APPOINTMENT WITH DR. Katrinka Blazing FOR FUTURE REFILLS)      [DISCONTINUED] zinc sulfate (ZINCATE) 220 (50 Zn) MG capsule Take 1 capsule (220 mg) by mouth daily 14 capsule 0    albuterol sulfate HFA (PROVENTIL) 108 (90 Base) MCG/ACT inhaler Inhale 1-2 puffs into the lungs every 4 (four) hours as needed for Wheezing or Shortness of Breath 1 g 0    metoprolol succinate XL (TOPROL-XL) 50 MG 24 hr tablet Take 1 tablet (50 mg) by mouth daily 90 tablet 1    olmesartan (BENICAR) 40 MG tablet TAKE 1 TABLET BY MOUTH ONCE DAILY. 90 tablet 1    rosuvastatin (CRESTOR) 5 MG tablet TAKE ONE TABLET BY MOUTH ON MONDAY, WEDNESDAY, AND FRIDAY. 90 tablet 1    [EXPIRED] vitamin C (ASCORBIC ACID) 500 MG tablet Take 1 tablet (500 mg) by mouth daily for 14 days 14 tablet 0     No facility-administered encounter medications on file as of 11/13/2021.       The patient has been informed of all ordered tests and/or consults, if applicable.  All patient concerns and questions have been addressed.      Shanna Cisco, MD  [facility]  [facility]  [place]  [phone] [address]  [place] [address]  [phone]    This note was generated by the Epic EMR system/ Dragon speech recognition and may contain inherent errors or  omissions not intended by the user. Grammatical errors, random word insertions, deletions, pronoun errors and incomplete sentences are occasional consequences of this technology due to software limitations. Not all errors are caught or corrected. If there are questions or concerns about the content of this note or information contained within the body of this dictation they should be addressed directly with the author for clarification

## 2021-11-24 ENCOUNTER — Other Ambulatory Visit
Admission: RE | Admit: 2021-11-24 | Discharge: 2021-11-24 | Disposition: A | Discharge status: Home / Self Care | Payer: No Typology Code available for payment source | Source: Ambulatory Visit | Attending: Family Medicine | Admitting: Family Medicine

## 2021-11-24 ENCOUNTER — Ambulatory Visit (RURAL_HEALTH_CENTER): Payer: Self-pay | Admitting: Orthopaedic Surgery

## 2021-11-24 DIAGNOSIS — I10 Essential (primary) hypertension: Secondary | ICD-10-CM

## 2021-11-24 DIAGNOSIS — M25521 Pain in right elbow: Secondary | ICD-10-CM

## 2021-11-24 DIAGNOSIS — L989 Disorder of the skin and subcutaneous tissue, unspecified: Secondary | ICD-10-CM

## 2021-11-24 LAB — VH MICROALBUMIN / CREATININE RATIO
Microalbumin-Random, U: 13.5 ug/mL (ref 0.0–20.0)
Microalbumin/Creatinine Ratio: 11 mg/gm
Urine Creatinine Random: 121 mg/dL

## 2021-11-24 LAB — HEMOGLOBIN A1C
Estimated Average Glucose: 114 mg/dL
Hgb A1C, %: 5.6 %

## 2021-11-24 LAB — LIPID PANEL
Cholesterol: 198 mg/dL (ref 75–199)
Coronary Heart Disease Risk: 3.41
HDL: 58 mg/dL (ref 45–65)
LDL Calculated: 120 mg/dL
Triglycerides: 100 mg/dL (ref 10–150)
VLDL: 20 (ref 0–40)

## 2021-11-24 LAB — THYROID STIMULATING HORMONE (TSH), REFLEX ON ABNORMAL TO FREE T4, SERUM: TSH: 2.65 u[IU]/mL (ref 0.40–4.20)

## 2021-12-05 ENCOUNTER — Encounter (RURAL_HEALTH_CENTER): Payer: Self-pay | Admitting: Orthopaedic Surgery

## 2021-12-05 ENCOUNTER — Ambulatory Visit (RURAL_HEALTH_CENTER): Payer: No Typology Code available for payment source

## 2021-12-05 ENCOUNTER — Ambulatory Visit: Payer: No Typology Code available for payment source | Attending: Orthopaedic Surgery | Admitting: Orthopaedic Surgery

## 2021-12-05 VITALS — Ht 63.0 in | Wt 177.0 lb

## 2021-12-05 DIAGNOSIS — G8929 Other chronic pain: Secondary | ICD-10-CM | POA: Insufficient documentation

## 2021-12-05 DIAGNOSIS — M7541 Impingement syndrome of right shoulder: Secondary | ICD-10-CM | POA: Insufficient documentation

## 2021-12-05 DIAGNOSIS — M7021 Olecranon bursitis, right elbow: Secondary | ICD-10-CM | POA: Insufficient documentation

## 2021-12-05 DIAGNOSIS — M25521 Pain in right elbow: Secondary | ICD-10-CM | POA: Insufficient documentation

## 2021-12-05 DIAGNOSIS — M79644 Pain in right finger(s): Secondary | ICD-10-CM | POA: Insufficient documentation

## 2021-12-07 DIAGNOSIS — M7021 Olecranon bursitis, right elbow: Secondary | ICD-10-CM | POA: Insufficient documentation

## 2021-12-07 DIAGNOSIS — M7541 Impingement syndrome of right shoulder: Secondary | ICD-10-CM | POA: Insufficient documentation

## 2021-12-07 NOTE — Progress Notes (Signed)
Progress Note      Patient Name:  Christine Little    Chief Complaint   Patient presents with    Elbow Pain     Right, she has a lump on her elbow that has been there a few months- PCP  referred patient for elbow  She did have a fall August 26, 2021 and landed hitting elbow on asphalt surface  She has had pain in elbow since the fall and shoulder pain  Right hand dom       Subjective/HPI Comments:    Patient is a  76 y.o. female who presents with The primary encounter diagnosis was Olecranon bursitis, right elbow. Diagnoses of Chronic pain of right thumb, Right elbow pain, and Impingement syndrome of right shoulder were also pertinent to this visit.     Outpatient Medications Marked as Taking for the 12/05/21 encounter (Office Visit) with Eustaquio Boyden, Deandrae Wajda R, DO   Medication Sig Dispense Refill    amoxicillin (AMOXIL) 500 MG capsule amoxicillin 500 mg capsule   TAKE 4 CAPSULES BY MOUTH 1 HOUR PRIOR TO DENTAL VISIT      Ascorbic Acid (vitamin C) 1000 MG tablet Take 100 Units by mouth daily      aspirin 81 MG EC tablet aspirin 81 mg tablet,delayed release      Calcium-Magnesium 300-300 MG Tab Calcium Magnesium      cloNIDine (CATAPRES) 0.1 MG tablet Take 1 tablet (0.1 mg) by mouth 3 (three) times daily as needed (If BP>160/59mmHg) 15 tablet 0    LUTEIN PO Take 1 capsule by mouth daily      metoprolol succinate XL (TOPROL-XL) 50 MG 24 hr tablet Take 1 tablet (50 mg) by mouth daily 90 tablet 1    Misc Natural Products (GLUCOSAMINE CHOND MSM FORMULA PO) Take 1 tablet by mouth daily      mometasone (ELOCON) 0.1 % ointment       Multiple Vitamin (Thera) Tab Take 1 tablet by mouth daily      olmesartan (BENICAR) 40 MG tablet TAKE 1 TABLET BY MOUTH ONCE DAILY. 90 tablet 1    rosuvastatin (CRESTOR) 5 MG tablet TAKE ONE TABLET BY MOUTH ON MONDAY, WEDNESDAY, AND FRIDAY. 90 tablet 1    Sodium Hyaluronate, oral, (HYALURONIC ACID PO) Hyaluronic Acid (chond-collgn)       Allergies   Allergen Reactions    Bee Venom Anaphylaxis    Tobacco  Anaphylaxis, Dizziness, Respiratory Distress and Shortness Of Breath    Atorvastatin Muscle Pain and Other (See Comments)     Body pain, loss of sleep  Unknown      Epinephrine     Hydrocodone Nausea Only    Molds & Smuts Other (See Comments) and Nausea And Vomiting     Severe headache and tearing of the eyes  Severe headache and tearing of the eyes      Oxycodone Nausea Only    Powder Scent Fragrance     Tramadol Other (See Comments) and Nausea And Vomiting     Unknown  Unknown      Hydrochlorothiazide Anxiety    Lisinopril Anxiety     Past Medical History:   Diagnosis Date    Primary malignant neoplasm of endometrium      Past Surgical History:   Procedure Laterality Date    CHOLECYSTECTOMY  11/2017    HYSTERECTOMY      TOTAL KNEE ARTHROPLASTY Bilateral 04/2017     Social History     Occupational History  Occupation: retired   Tobacco Use    Smoking status: Never    Smokeless tobacco: Never   Vaping Use    Vaping Use: Never used   Substance and Sexual Activity    Alcohol use: Never    Drug use: Never    Sexual activity: Not on file     Family History   Problem Relation Age of Onset    Heart disease Mother     Cancer Mother     Breast cancer Mother     Heart disease Father     Cancer Maternal Aunt     Breast cancer Maternal Aunt     Heart disease Maternal Grandmother     Diabetes Maternal Grandmother     Heart disease Maternal Grandfather     Heart disease Paternal Grandmother     Heart disease Paternal Grandfather        Review of Systems:    Review of Systems   Constitutional:  Negative for activity change, fatigue, fever and unexpected weight change.   Musculoskeletal:  Positive for arthralgias and joint swelling.   Skin:  Negative for rash and wound.   Neurological:  Negative for weakness, numbness and headaches.   Hematological:  Does not bruise/bleed easily.   Psychiatric/Behavioral:  Negative for confusion and sleep disturbance. The patient is not nervous/anxious and is not hyperactive.       Objective/  Physical Exam:    Ht 1.6 m (5\' 3" )   Wt 80.3 kg (177 lb)   BMI 31.35 kg/m     Physical Exam  Musculoskeletal:      Comments: Right elbow inspection, swelling noted at the tip of the olecranon.  Slight discomfort with palpation of swelling.  No redness.  Full extension and 120 degrees of flexion.  Normal supination pronation.  Right shoulder patient able to achieve forward flexion of 120 degrees with 90 degrees of abduction.  Grossly positive Neer and Hawkins sign.  Slight weakness with resisted external rotation.  Normal elbow wrist motion.  Radial pulse palpated and strong sensation is intact.        Imaging Results:      XR Elbow Right AP Lateral And Obliques  AP, lateral, oblique view of right elbow x-ray that was done on 12/05/2021   shows normal x-ray.      Assessment:    1. Olecranon bursitis, right elbow    2. Chronic pain of right thumb    3. Right elbow pain    4. 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  Patient also advised that this may be aggravated with direct contact with heart surface especially with normal functional activity.  If this becomes problematic, this can be surgically removed.  Right shoulder pain, patient has impingement syndrome with possible rotator cuff tear.  Continue with home exercise plan and follow-up with Dr. Clovis Riley for treatment consideration.        This note was generated by the Epic EMR system/ Dragon speech recognition and may contain inherent errors or omissions not intended by the user. Grammatical errors, random word insertions, deletions, pronoun errors and incomplete sentences are occasional consequences of this technology due to software limitations. Not all errors are caught or corrected. If there are questions or concerns about the content of this note or information contained within the body of this dictation they should be addressed  directly with the author for  clarification

## 2021-12-29 ENCOUNTER — Encounter (RURAL_HEALTH_CENTER): Payer: Self-pay | Admitting: Family Medicine

## 2021-12-29 ENCOUNTER — Telehealth (RURAL_HEALTH_CENTER): Payer: Self-pay

## 2021-12-29 ENCOUNTER — Ambulatory Visit: Payer: No Typology Code available for payment source | Attending: Family Medicine | Admitting: Family Medicine

## 2021-12-29 VITALS — BP 138/80 | HR 48 | Temp 97.4°F | Resp 16 | Ht 63.0 in | Wt 179.0 lb

## 2021-12-29 DIAGNOSIS — Z8542 Personal history of malignant neoplasm of other parts of uterus: Secondary | ICD-10-CM | POA: Insufficient documentation

## 2021-12-29 DIAGNOSIS — M7541 Impingement syndrome of right shoulder: Secondary | ICD-10-CM

## 2021-12-29 DIAGNOSIS — I10 Essential (primary) hypertension: Secondary | ICD-10-CM | POA: Insufficient documentation

## 2021-12-29 DIAGNOSIS — Z23 Encounter for immunization: Secondary | ICD-10-CM

## 2021-12-29 DIAGNOSIS — M7021 Olecranon bursitis, right elbow: Secondary | ICD-10-CM

## 2021-12-29 DIAGNOSIS — E785 Hyperlipidemia, unspecified: Secondary | ICD-10-CM | POA: Insufficient documentation

## 2021-12-29 MED ORDER — MOMETASONE FUROATE 0.1 % EX OINT
TOPICAL_OINTMENT | Freq: Every day | CUTANEOUS | 1 refills | Status: DC
Start: ? — End: 2021-12-29

## 2021-12-29 MED ORDER — METOPROLOL SUCCINATE ER 25 MG PO TB24
25.0000 mg | ORAL_TABLET | Freq: Every day | ORAL | 1 refills | Status: DC
Start: ? — End: 2021-12-29

## 2021-12-29 NOTE — Telephone Encounter (Signed)
Pharmacy did not have mometasone ointment, can you resend to [facility]

## 2021-12-29 NOTE — Progress Notes (Signed)
Progress Note      Patient Name:  Christine Little    Subjective/HPI Comments:    Patient is a  76 y.o. female who presents with   Chief Complaint   Patient presents with    Follow-up     Go over BP medication      She has an appointment upcoming with cardiology in April.  Outpatient Medications Marked as Taking for the 12/29/21 encounter (Office Visit) with Marijah Larranaga, Hipolito Bayley, MD   Medication Sig Dispense Refill    amoxicillin (AMOXIL) 500 MG capsule amoxicillin 500 mg capsule   TAKE 4 CAPSULES BY MOUTH 1 HOUR PRIOR TO DENTAL VISIT      Ascorbic Acid (vitamin C) 1000 MG tablet Take 100 Units by mouth daily      aspirin 81 MG EC tablet aspirin 81 mg tablet,delayed release      Calcium-Magnesium 300-300 MG Tab Calcium Magnesium      LUTEIN PO Take 1 capsule by mouth daily      Misc Natural Products (GLUCOSAMINE CHOND MSM FORMULA PO) Take 1 tablet by mouth daily      Multiple Vitamin (Thera) Tab Take 1 tablet by mouth daily      olmesartan (BENICAR) 40 MG tablet TAKE 1 TABLET BY MOUTH ONCE DAILY. 90 tablet 1    Omega-3 Fatty Acids (Omega-3 Fish Oil) 500 MG Cap Take by mouth      rosuvastatin (CRESTOR) 5 MG tablet TAKE ONE TABLET BY MOUTH ON MONDAY, WEDNESDAY, AND FRIDAY. 90 tablet 1    Sodium Hyaluronate, oral, (HYALURONIC ACID PO) Hyaluronic Acid (chond-collgn)      [DISCONTINUED] metoprolol succinate XL (TOPROL-XL) 50 MG 24 hr tablet Take 1 tablet (50 mg) by mouth daily 90 tablet 1    [DISCONTINUED] mometasone (ELOCON) 0.1 % ointment        Allergies   Allergen Reactions    Bee Venom Anaphylaxis    Tobacco Anaphylaxis, Dizziness, Respiratory Distress and Shortness Of Breath    Atorvastatin Muscle Pain and Other (See Comments)     Body pain, loss of sleep  Unknown      Epinephrine     Hydrocodone Nausea Only    Molds & Smuts Other (See Comments) and Nausea And Vomiting     Severe headache and tearing of the eyes  Severe headache and tearing of the eyes      Oxycodone Nausea Only    Powder Scent Fragrance     Tramadol  Other (See Comments) and Nausea And Vomiting     Unknown  Unknown      Hydrochlorothiazide Anxiety    Lisinopril Anxiety     Past Medical History:   Diagnosis Date    Primary malignant neoplasm of endometrium      Past Surgical History:   Procedure Laterality Date    CHOLECYSTECTOMY  11/2017    HYSTERECTOMY      TOTAL KNEE ARTHROPLASTY Bilateral 04/2017     Social History     Occupational History    Occupation: retired   Tobacco Use    Smoking status: Never    Smokeless tobacco: Never   Vaping Use    Vaping Use: Never used   Substance and Sexual Activity    Alcohol use: Never    Drug use: Never    Sexual activity: Not on file     Family History   Problem Relation Age of Onset    Heart disease Mother     Cancer Mother  Breast cancer Mother     Heart disease Father     Cancer Maternal Aunt     Breast cancer Maternal Aunt     Heart disease Maternal Grandmother     Diabetes Maternal Grandmother     Heart disease Maternal Grandfather     Heart disease Paternal Grandmother     Heart disease Paternal Grandfather        Review of Systems:  Review of Systems   Constitutional:  Negative for chills, diaphoresis, fever, malaise/fatigue and weight loss.   HENT:  Negative for congestion, ear discharge, ear pain, sinus pain and sore throat.    Eyes:  Negative for blurred vision, pain, discharge and redness.   Respiratory:  Negative for cough, shortness of breath and wheezing.    Cardiovascular:  Negative for chest pain, palpitations and orthopnea.   Gastrointestinal:  Negative for abdominal pain, diarrhea, nausea and vomiting.   Genitourinary:  Negative for dysuria, frequency and urgency.   Musculoskeletal:  Negative for joint pain and myalgias.   Skin:  Negative for rash.   Neurological:  Negative for dizziness, tingling, weakness and headaches.   Endo/Heme/Allergies:  Negative for environmental allergies and polydipsia. Does not bruise/bleed easily.   Psychiatric/Behavioral:  Negative for depression and suicidal ideas. The  patient is not nervous/anxious and does not have insomnia.      Objective/ Physical Exam:  Vitals:    12/29/21 1348   BP: 138/80   BP Site: Left arm   Patient Position: Sitting   Cuff Size: Large   Pulse: (!) 48   Resp: 16   Temp: 97.4 F (36.3 C)   TempSrc: Temporal   SpO2: 96%   Weight: 81.2 kg (179 lb)   Height: 1.6 m (5\' 3" )     Body mass index is 31.71 kg/m.  Pain Score: 2-mild pain    Physical Exam  Vitals reviewed.   Constitutional:       General: She is not in acute distress.     Appearance: She is well-developed. She is not diaphoretic.   HENT:      Head: Normocephalic and atraumatic.      Right Ear: External ear normal.      Left Ear: External ear normal.      Nose: Nose normal.   Eyes:      Conjunctiva/sclera: Conjunctivae normal.      Pupils: Pupils are equal, round, and reactive to light.   Neck:      Vascular: No carotid bruit.   Cardiovascular:      Rate and Rhythm: Regular rhythm. Tachycardia present.      Heart sounds: Normal heart sounds. No murmur heard.    No friction rub. No gallop.   Pulmonary:      Effort: Pulmonary effort is normal. No respiratory distress.      Breath sounds: Normal breath sounds. No wheezing or rales.   Abdominal:      General: Bowel sounds are normal. There is no distension.      Palpations: Abdomen is soft.      Tenderness: There is no abdominal tenderness.   Musculoskeletal:         General: Normal range of motion.   Skin:     General: Skin is warm and dry.   Neurological:      Mental Status: She is alert and oriented to person, place, and time.   Psychiatric:         Behavior: Behavior normal.  Thought Content: Thought content normal.         Judgment: Judgment normal.     Assessment:    1. Need for Tdap vaccination    2. Impingement syndrome of right shoulder    3. Olecranon bursitis, right elbow    4. Primary hypertension    5. Dyslipidemia    6. History of endometrial cancer        Plan: Decrease Toprol back to 25 mg due to bradycardia.  Orders Placed This  Encounter   Procedures    Tdap vaccine greater than or equal to 7yo IM    Pneumococcal Conjugate 20 - Valent    CBC and differential     Standing Status:   Future     Standing Expiration Date:   12/29/2022     Order Specific Question:   Release to patient     Answer:   Immediate    Comprehensive metabolic panel     Standing Status:   Future     Standing Expiration Date:   12/29/2022     Order Specific Question:   Has the patient fasted?     Answer:   Yes     Order Specific Question:   Release to patient     Answer:   Immediate    Hemoglobin A1C     Standing Status:   Future     Standing Expiration Date:   12/29/2022     Order Specific Question:   Release to patient     Answer:   Immediate    Lipid panel     Standing Status:   Future     Standing Expiration Date:   12/29/2022     Order Specific Question:   Has the patient fasted?     Answer:   Yes     Order Specific Question:   Release to patient     Answer:   Immediate    Microalbumin / Creatinine Ratio     Standing Status:   Future     Standing Expiration Date:   12/29/2022     Order Specific Question:   Release to patient     Answer:   Immediate    TSH, Abn Reflex to Free T4, Serum     Standing Status:   Future     Standing Expiration Date:   12/29/2022     Order Specific Question:   Release to patient     Answer:   Immediate    Hepatitis C (HCV) antibody, Total     Standing Status:   Future     Standing Expiration Date:   12/29/2022     Order Specific Question:   Release to patient     Answer:   Immediate     1. Need for Tdap vaccination  - Tdap vaccine greater than or equal to 7yo IM  - CBC and differential; Future  - Comprehensive metabolic panel; Future  - Hemoglobin A1C; Future  - Lipid panel; Future  - Microalbumin / Creatinine Ratio; Future  - TSH, Abn Reflex to Free T4, Serum; Future  - Hepatitis C (HCV) antibody, Total; Future    2. Impingement syndrome of right shoulder  - CBC and differential; Future  - Comprehensive metabolic panel; Future  - Hemoglobin A1C;  Future  - Lipid panel; Future  - Microalbumin / Creatinine Ratio; Future  - TSH, Abn Reflex to Free T4, Serum; Future  - Hepatitis C (HCV) antibody, Total; Future  3. Olecranon bursitis, right elbow  - CBC and differential; Future  - Comprehensive metabolic panel; Future  - Hemoglobin A1C; Future  - Lipid panel; Future  - Microalbumin / Creatinine Ratio; Future  - TSH, Abn Reflex to Free T4, Serum; Future  - Hepatitis C (HCV) antibody, Total; Future    4. Primary hypertension  - CBC and differential; Future  - Comprehensive metabolic panel; Future  - Hemoglobin A1C; Future  - Lipid panel; Future  - Microalbumin / Creatinine Ratio; Future  - TSH, Abn Reflex to Free T4, Serum; Future  - Hepatitis C (HCV) antibody, Total; Future    5. Dyslipidemia  - CBC and differential; Future  - Comprehensive metabolic panel; Future  - Hemoglobin A1C; Future  - Lipid panel; Future  - Microalbumin / Creatinine Ratio; Future  - TSH, Abn Reflex to Free T4, Serum; Future  - Hepatitis C (HCV) antibody, Total; Future    6. History of endometrial cancer  - CBC and differential; Future  - Comprehensive metabolic panel; Future  - Hemoglobin A1C; Future  - Lipid panel; Future  - Microalbumin / Creatinine Ratio; Future  - TSH, Abn Reflex to Free T4, Serum; Future  - Hepatitis C (HCV) antibody, Total; Future      The patient has been informed of all ordered tests and/or consults, if applicable.  All patient concerns and questions have been addressed.      Shanna Cisco, MD  [facility]  [facility] [place]  [phone]  [place] [address]  [phone]    This note was generated by the Epic EMR system/ Dragon speech recognition and may contain inherent errors or omissions not intended by the user. Grammatical errors, random word insertions, deletions, pronoun errors and incomplete sentences are occasional consequences of this technology due to software  limitations. Not all errors are caught or corrected. If there are questions or concerns about the content of this note or information contained within the body of this dictation they should be addressed directly with the author for clarification

## 2022-01-01 ENCOUNTER — Other Ambulatory Visit (RURAL_HEALTH_CENTER): Payer: Self-pay | Admitting: Family Medicine

## 2022-01-01 MED ORDER — MOMETASONE FUROATE 0.1 % EX OINT
TOPICAL_OINTMENT | Freq: Every day | CUTANEOUS | 1 refills | Status: DC
Start: ? — End: 2022-01-01

## 2022-01-05 ENCOUNTER — Telehealth: Payer: Self-pay

## 2022-01-05 NOTE — Telephone Encounter (Signed)
PCP participates in Epic. Previous cardio participates in Care Everywhere. Updates requested. Patient has upcoming appointment 01/17/2022.

## 2022-01-17 ENCOUNTER — Encounter: Payer: Self-pay | Admitting: Cardiovascular Disease

## 2022-01-17 ENCOUNTER — Ambulatory Visit: Payer: No Typology Code available for payment source | Admitting: Cardiovascular Disease

## 2022-01-17 VITALS — BP 168/100 | HR 60 | Ht 63.0 in | Wt 177.1 lb

## 2022-01-17 DIAGNOSIS — G4733 Obstructive sleep apnea (adult) (pediatric): Secondary | ICD-10-CM

## 2022-01-17 DIAGNOSIS — I252 Old myocardial infarction: Secondary | ICD-10-CM

## 2022-01-17 NOTE — Progress Notes (Signed)
Cardiology Consult      Patient Name: Christine Little   Date of Birth: 10-24-1946    Provider: Carlyon Shadow, MD     Patient Care Team:  Christine Little Hipolito Bayley, MD as PCP - General (Family Medicine)    Chief Complaint: Hx of two Little  (in the 1990's), Hypertension, and Hyperlipidemia    History of Present Illness   Christine Little is a 76 y.o. female being seen today as a new patient for an evaluation of Hypertension and hyperlipidemia. She follows with Christine Little for primary care.     ED 10/29/21: COVID. Elevated BP (141/88).    She reports rare chest pain episodes. She notes SOB and lightheadedness with exertion. She also notes episodes in which she awakes from her sleep gasping for air.       She has frequent palpitations. She does not take her pulse during episodes of palpitations. Dr. Maylene Little increased Toprol-XL to 50 mg daily but lowered it back to 25 mg daily due to bradycardia.     Her BP is elevated today at 168/100. She notes SBP runs >150 at home. She is intolerant to Norvasc, Clonidine, Lisinopril and HCTZ.    She reports she used to follow with a cardiologist in [place] that diagnosed her with RBBB and MVP. We do not have these records.     She has a strong family hx of heart disease. Her mother had a pace maker implant.     She is moved to [place] in January of 2022. 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 She moved to [place] in 1999 to take care of her mother until she passed away. She moved back due to close to her daughter, grand daughter and two great grand children. She is widowed since 2017.     Review of Systems     Review of Systems   Constitutional: Negative.    HENT:  Positive for hearing loss.    Eyes: Negative.    Respiratory:  Positive for shortness of breath.    Cardiovascular:  Positive for chest pain (rare) and palpitations.   Gastrointestinal: Negative.    Genitourinary:  Positive for frequency.   Musculoskeletal:  Positive for back pain and joint pain.   Skin:  Negative.    Neurological:  Positive for tremors.   Endo/Heme/Allergies: Negative.    Psychiatric/Behavioral:  Positive for depression.      Past Medical History     PMHx 01/15/22 AS  PCP Christine Little    1.  DOE  remote cath no stents in [place]  a) Exercise ST 10/01/16-Myocardial perfusion is normal. This is a low risk study. Overall left ventricular systolic function was abnormal. Nuclear stress EF: 70%.     2. Hypertension  3. 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    Past Surgical History     Past Surgical History:   Procedure Laterality Date    CHOLECYSTECTOMY  11/2017    HYSTERECTOMY      TOTAL KNEE ARTHROPLASTY Bilateral 04/2017       Family History     Family History   Problem Relation Age of Onset    Heart disease Mother     Cancer Mother     Breast cancer Mother     Heart disease Father     Cancer Maternal Aunt     Breast cancer Maternal Aunt     Heart disease Maternal Grandmother  Diabetes Maternal Grandmother     Heart disease Maternal Grandfather     Heart disease Paternal Grandmother     Heart disease Paternal Grandfather    Her mother had a pacemaker    Social History     Social History     Tobacco Use    Smoking status: Never    Smokeless tobacco: Never   Vaping Use    Vaping Use: Never used   Substance Use Topics    Alcohol use: Never    Drug use: Never     She is moved to [place] in January of 2022.   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 She moved to [place] in 1999 to take care of her mother until she passed away.     She moved back due to close to her daughter, grand daughter and two great grand children.   She is widowed since 2017.     Allergies     Allergies   Allergen Reactions    Bee Venom Anaphylaxis    Tobacco Anaphylaxis, Dizziness, Respiratory Distress and Shortness Of Breath    Atorvastatin Muscle Pain and Other (See Comments)     Body pain, loss of sleep  Unknown      Epinephrine     Hydrocodone Nausea Only    Molds & Smuts  Other (See Comments) and Nausea And Vomiting     Severe headache and tearing of the eyes  Severe headache and tearing of the eyes      Oxycodone Nausea Only    Powder Scent Fragrance     Tramadol Other (See Comments) and Nausea And Vomiting     Unknown  Unknown      Hydrochlorothiazide Anxiety    Lisinopril Anxiety       Medications       Current Outpatient Medications:     Ascorbic Acid (vitamin C) 1000 MG tablet, Take 100 Units by mouth daily, Disp: , Rfl:     aspirin 81 MG EC tablet, aspirin 81 mg tablet,delayed release, Disp: , Rfl:     Calcium-Magnesium 300-300 MG Tab, Calcium Magnesium, Disp: , Rfl:     LUTEIN PO, Take 1 capsule by mouth daily, Disp: , Rfl:     metoprolol succinate XL (TOPROL-XL) 25 MG 24 hr tablet, Take 1 tablet (25 mg) by mouth daily, Disp: 90 tablet, Rfl: 1    Misc Natural Products (GLUCOSAMINE CHOND MSM FORMULA PO), Take 1 tablet by mouth daily, Disp: , Rfl:     mometasone (ELOCON) 0.1 % ointment, Apply topically daily As directed., Disp: 45 g, Rfl: 1    Multiple Vitamin (Thera) Tab, Take 1 tablet by mouth daily, Disp: , Rfl:     olmesartan (BENICAR) 40 MG tablet, TAKE 1 TABLET BY MOUTH ONCE DAILY., Disp: 90 tablet, Rfl: 1    Omega-3 Fatty Acids (Omega-3 Fish Oil) 500 MG Cap, Take by mouth, Disp: , Rfl:     rosuvastatin (CRESTOR) 5 MG tablet, TAKE ONE TABLET BY MOUTH ON MONDAY, WEDNESDAY, AND FRIDAY., Disp: 90 tablet, Rfl: 1    Sodium Hyaluronate, oral, (HYALURONIC ACID PO), Hyaluronic Acid (chond-collgn), Disp: , Rfl:     amoxicillin (AMOXIL) 500 MG capsule, amoxicillin 500 mg capsule  TAKE 4 CAPSULES BY MOUTH 1 HOUR PRIOR TO DENTAL VISIT (Patient not taking: Reported on 01/17/2022), Disp: , Rfl:     Physical Exam   Visit Vitals  BP (!) 168/100 (BP Site: Left arm, Patient Position: Sitting)  Pulse 60   Ht 1.6 m (5\' 3" )   Wt 80.3 kg (177 lb 1.6 oz)   BMI 31.37 kg/m     Vitals:    01/17/22 1142   BP: (!) 168/100   BP Site: Left arm   Patient Position: Sitting   Pulse: 60   Weight: 80.3 kg  (177 lb 1.6 oz)   Height: 1.6 m (5\' 3" )     Wt Readings from Last 3 Encounters:   01/17/22 80.3 kg (177 lb 1.6 oz)   12/29/21 81.2 kg (179 lb)   12/05/21 80.3 kg (177 lb)       Constitutional -  Well appearing, female bright  in no distress  Eyes - Pupils equal and reactive, extraocular eye movements intact, sclera anicteric  Oropharynx - Moist mucous membranes  Respiratory - Clear to auscultation bilaterally, normal respiratory effort    Cardiovascular system -    Regular rate and rhythm    Normal S1, S2    No  click  no ectopy   Jugular venous pulse is normal        Carotid upstroke normal, no carotid bruits auscultated   2+ pulses in the posterior tibial / dorsalis pedis bilaterally    Neurological - Alert, oriented, no focal neurological deficits  Extremities -  No clubbing or cyanosis. No peripheral edema  Skin - Warm and dry, no rashes  Psych- Appropriate affect    Labs     Lab Results   Component Value Date/Time    WBC 6.2 10/29/2021 04:41 PM    RBC 5.01 (H) 10/29/2021 04:41 PM    HGB 14.5 10/29/2021 04:41 PM    HCT 45.7 10/29/2021 04:41 PM    PLT 202 10/29/2021 04:41 PM       Lab Results   Component Value Date/Time    TSH 2.65 11/24/2021 09:15 AM       Lab Results   Component Value Date/Time    NA 137 10/29/2021 04:41 PM    K 3.8 10/29/2021 04:41 PM    CL 104 10/29/2021 04:41 PM    CO2 22 10/29/2021 04:41 PM    GLU 143 (H) 10/29/2021 04:41 PM    BUN 19 10/29/2021 04:41 PM    CREAT 0.85 10/29/2021 04:41 PM    PROT 7.0 10/29/2021 04:41 PM    ALKPHOS 84 10/29/2021 04:41 PM    AST 17 10/29/2021 04:41 PM    ALT 20 10/29/2021 04:41 PM       Lab Results   Component Value Date/Time    CHOL 198 11/24/2021 09:15 AM    TRIG 100 11/24/2021 09:15 AM    HDL 58 11/24/2021 09:15 AM    LDL 120 11/24/2021 09:15 AM       Lab Results   Component Value Date/Time    HGBA1CPERCNT 5.6 11/24/2021 09:15 AM     10/29/21- Mag 1.7, WBC 6.2, RBC 5.01, HGB 14.5, HCT 45.7, MCV 91, PLT 202, Na 137, K 3.8, Cl 104, CO2 22, Ca 10.4, Glu 143,  Creat 0.85, BUN 19, ALB 3.6, ALT 20, AST 17, GFR 71    11/24/21- A1c 5.6%, TSH 2.65, Chol 198, Trig 100, HDL 58, LDL 120    Reviewed by: Christine Shadow, MD  Cardiogenics:     EKG: NSR  pr 0.22 LAFB incomplete RBBB  nonspecific stt changes    Personally reviewed by by me on 01/17/2022  Impression and Recommendations:     1. SOB/DOE  2. Hypertension  - Continue Benicar 40 mg daily     3. Hyperlipidemia  - 11/24/21: Chol 198, Trig 100, HDL 58, LDL 120    4.  1 st degree av block  LAFB  incomplete RBBB      5. Glucose Intolerance-- A1c 5.6%    6. Palpitations  - Start increased dosage of 37.5 mg daily       PLAN: Advised taking her HR and BP during episodes of palpitations. She is going to trial Toprol-XL at 37.5 mg daily. If BP remains uncontrolled consider adding low dose aldactone or Cardura. Ordered an echo to evaluate MVP, EF and wall motion. Ordered nuclear ST to evaluate for ischemia  Ordered a sleep study to evaluate for OSA.     Follow-up in  3-4 months.        Thank you kindly for referring this patient.     Respectfully,   Christine Shadow, MD  01/17/2022      This note was scribed by Kathalene Frames on behalf of Mylinda Latina. Tressia Danas, MD.

## 2022-01-18 ENCOUNTER — Telehealth: Payer: Self-pay

## 2022-01-18 NOTE — Telephone Encounter (Signed)
Referral sent to [facility]    Christine Little

## 2022-02-01 NOTE — Telephone Encounter (Signed)
OXIMETER RECEIVED AND SCANNED TO 3.9.23 SCAN ONLY

## 2022-02-05 ENCOUNTER — Ambulatory Visit (RURAL_HEALTH_CENTER): Payer: Self-pay | Admitting: Dermatology

## 2022-02-19 ENCOUNTER — Telehealth (RURAL_HEALTH_CENTER): Payer: Self-pay

## 2022-02-19 NOTE — Telephone Encounter (Signed)
Patient called in stated she's been having having some right sided kidney pain for about a week, BP been running 170/100.  This is the same area in which she had CA so she is a little concerned.  Patient would like to be seen and have some blood work done.

## 2022-02-19 NOTE — Telephone Encounter (Signed)
Tried calling patient back but it rang and just got a dial tone

## 2022-02-21 NOTE — Telephone Encounter (Signed)
Patient was instructed to go to ER or UC if symptoms persisted or to call office to see if she could get a sooner appt with Dr

## 2022-02-25 ENCOUNTER — Emergency Department
Admission: EM | Admit: 2022-02-25 | Discharge: 2022-02-25 | Disposition: A | Discharge status: Home / Self Care | Payer: No Typology Code available for payment source | Attending: Emergency Medicine | Admitting: Emergency Medicine

## 2022-02-25 ENCOUNTER — Emergency Department: Payer: No Typology Code available for payment source

## 2022-02-25 DIAGNOSIS — H538 Other visual disturbances: Secondary | ICD-10-CM | POA: Insufficient documentation

## 2022-02-25 DIAGNOSIS — R42 Dizziness and giddiness: Secondary | ICD-10-CM

## 2022-02-25 DIAGNOSIS — I161 Hypertensive emergency: Secondary | ICD-10-CM | POA: Insufficient documentation

## 2022-02-25 DIAGNOSIS — R110 Nausea: Secondary | ICD-10-CM | POA: Insufficient documentation

## 2022-02-25 LAB — COMPREHENSIVE METABOLIC PANEL
ALT: 18 U/L (ref 0–55)
AST (SGOT): 17 U/L (ref 10–42)
Albumin/Globulin Ratio: 1.26 Ratio (ref 0.80–2.00)
Albumin: 3.9 gm/dL (ref 3.5–5.0)
Alkaline Phosphatase: 77 U/L (ref 40–145)
Anion Gap: 13.7 mMol/L (ref 7.0–18.0)
BUN / Creatinine Ratio: 21.3 Ratio (ref 10.0–30.0)
BUN: 19 mg/dL (ref 7–22)
Bilirubin, Total: 0.4 mg/dL (ref 0.1–1.2)
CO2: 23 mMol/L (ref 20–30)
Calcium: 9.9 mg/dL (ref 8.5–10.5)
Chloride: 106 mMol/L (ref 98–110)
Creatinine: 0.89 mg/dL (ref 0.60–1.20)
EGFR: 67 mL/min/{1.73_m2} (ref 60–150)
Globulin: 3.1 gm/dL (ref 2.0–4.0)
Glucose: 118 mg/dL — ABNORMAL HIGH (ref 71–99)
Osmolality Calculated: 281 mOsm/kg (ref 275–300)
Potassium: 3.7 mMol/L (ref 3.5–5.3)
Protein, Total: 7 gm/dL (ref 6.0–8.3)
Sodium: 139 mMol/L (ref 136–147)

## 2022-02-25 LAB — CBC AND DIFFERENTIAL
Basophils %: 1 % (ref 0.0–3.0)
Basophils Absolute: 0.1 10*3/uL (ref 0.0–0.3)
Eosinophils %: 7.8 % — ABNORMAL HIGH (ref 0.0–7.0)
Eosinophils Absolute: 0.5 10*3/uL (ref 0.0–0.8)
Hematocrit: 46.4 % (ref 36.0–48.0)
Hemoglobin: 15.7 gm/dL (ref 12.0–16.0)
Lymphocytes Absolute: 2.5 10*3/uL (ref 0.6–5.1)
Lymphocytes: 40.5 % (ref 15.0–46.0)
MCH: 31 pg (ref 28–35)
MCHC: 34 gm/dL (ref 32–36)
MCV: 92 fL (ref 80–100)
MPV: 8.3 fL (ref 6.0–10.0)
Monocytes Absolute: 0.4 10*3/uL (ref 0.1–1.7)
Monocytes: 7.1 % (ref 3.0–15.0)
Neutrophils %: 43.6 % (ref 42.0–78.0)
Neutrophils Absolute: 2.7 10*3/uL (ref 1.7–8.6)
PLT CT: 212 10*3/uL (ref 130–440)
RBC: 5.04 10*6/uL — ABNORMAL HIGH (ref 3.80–5.00)
RDW: 12.6 % (ref 11.0–14.0)
WBC: 6.2 10*3/uL (ref 4.0–11.0)

## 2022-02-25 LAB — ECG 12-LEAD
P Wave Axis: 13 deg
P-R Interval: 203 ms
Patient Age: 75 years
Q-T Interval(Corrected): 427 ms
Q-T Interval: 417 ms
QRS Axis: -53 deg
QRS Duration: 103 ms
T Axis: 16 years
Ventricular Rate: 63 //min

## 2022-02-25 LAB — VH URINALYSIS WITH MICROSCOPIC IF INDICATED
Bilirubin, UA: NEGATIVE
Blood, UA: NEGATIVE
Glucose, UA: NEGATIVE mg/dL
Ketones UA: NEGATIVE mg/dL
Leukocyte Esterase, UA: NEGATIVE Leu/uL
Nitrite, UA: NEGATIVE
Protein, UR: NEGATIVE mg/dL
Urine Specific Gravity: 1.015
Urobilinogen, UA: NORMAL mg/dL
pH, Urine: 6 pH (ref 5.0–8.0)

## 2022-02-25 LAB — TROPONIN I: Troponin I: 0 ng/mL (ref 0.00–0.02)

## 2022-02-25 MED ORDER — HYDRALAZINE HCL 20 MG/ML IJ SOLN
INTRAMUSCULAR | Status: AC
Start: 2022-02-25 — End: ?
  Filled 2022-02-25: qty 1

## 2022-02-25 MED ORDER — DOXAZOSIN MESYLATE 1 MG PO TABS
1.0000 mg | ORAL_TABLET | Freq: Every evening | ORAL | 0 refills | Status: DC
Start: 2022-02-25 — End: 2022-04-13

## 2022-02-25 MED ORDER — HYDRALAZINE HCL 20 MG/ML IJ SOLN
10.0000 mg | Freq: Once | INTRAMUSCULAR | Status: AC
Start: 2022-02-25 — End: 2022-02-25
  Administered 2022-02-25: 10 mg via INTRAVENOUS

## 2022-02-25 NOTE — ED Notes (Signed)
MD at bedside. 

## 2022-02-25 NOTE — ED Provider Notes (Signed)
History     Chief Complaint   Patient presents with    Hypertension    Dizziness     This patient has known hypertension.  Recently it has been uncontrolled.  She takes it twice a day at home and its been running in the 170/100 range.  She is currently on 40 mg of olmesartan and Dr. Tressia Danas recently increased her Toprol from 25 mg to 37.5 mg.  She has taken both this morning.  Dr. Maylene Roes had tried increasing her Toprol to 50 but her heart rate became too bradycardic.    I reviewed Dr. Janeann Forehand note dated 01/17/2022.  Regarding her blood pressure the patient reported she is in the past been intolerant of hydrochlorothiazide, amlodipine, clonidine, lisinopril.    Dr. Janeann Forehand note stated that if her blood pressure remained uncontrolled she will consider adding low-dose Aldactone or Cardura.  Dr. Tressia Danas plan to order an outpatient nuclear stress test, outpatient echo, and sleep study for obstructive sleep apnea.      This morning when the patient got up her blood pressure was 186 systolic.  She took her meds.  When she got up and tried to walk she felt unsteady.  She felt like she was lurching on a ship.  She called her daughter to come get her.    The daughter reports that she walks slow but does not describe ataxia.  Patient reports 3 years ago her blood pressure was up and she had a dizzy episode went to the hospital was given meclizine and the blood pressure went down.  Today the patient reports that her vision is "foggy like looking through a screen.  She feels like she is trying to get a headache.  No chest pain or shortness of breath.  Some nausea and some feeling like she is going to have diarrhea.          Past Medical History:   Diagnosis Date    Primary malignant neoplasm of endometrium        Past Surgical History:   Procedure Laterality Date    CHOLECYSTECTOMY  11/2017    HYSTERECTOMY      TOTAL KNEE ARTHROPLASTY Bilateral 04/2017       Family History   Problem Relation Age of Onset    Heart  disease Mother     Cancer Mother     Breast cancer Mother     Heart disease Father     Cancer Maternal Aunt     Breast cancer Maternal Aunt     Heart disease Maternal Grandmother     Diabetes Maternal Grandmother     Heart disease Maternal Grandfather     Heart disease Paternal Grandmother     Heart disease Paternal Grandfather        Social  Social History     Tobacco Use    Smoking status: Never    Smokeless tobacco: Never   Vaping Use    Vaping status: Never Used   Substance Use Topics    Alcohol use: Never    Drug use: Never       .     Allergies   Allergen Reactions    Bee Venom Anaphylaxis    Tobacco Anaphylaxis, Dizziness, Respiratory Distress and Shortness Of Breath    Atorvastatin Muscle Pain and Other (See Comments)     Body pain, loss of sleep  Unknown      Epinephrine     Hydrocodone Nausea Only    Molds &  Smuts Other (See Comments) and Nausea And Vomiting     Severe headache and tearing of the eyes  Severe headache and tearing of the eyes      Oxycodone Nausea Only    Powder Scent Fragrance     Tramadol Other (See Comments) and Nausea And Vomiting     Unknown  Unknown      Hydrochlorothiazide Anxiety    Lisinopril Anxiety       Home Medications               Ascorbic Acid (vitamin C) 1000 MG tablet     Take 100 Units by mouth daily     aspirin 81 MG EC tablet     aspirin 81 mg tablet,delayed release     Calcium-Magnesium 300-300 MG Tab     Calcium Magnesium     LUTEIN PO     Take 1 capsule by mouth daily     metoprolol succinate XL (TOPROL-XL) 25 MG 24 hr tablet     Take 1 tablet (25 mg) by mouth daily     Misc Natural Products (GLUCOSAMINE CHOND MSM FORMULA PO)     Take 1 tablet by mouth daily     mometasone (ELOCON) 0.1 % ointment     Apply topically daily As directed.     Multiple Vitamin (Thera) Tab     Take 1 tablet by mouth daily     olmesartan (BENICAR) 40 MG tablet     TAKE 1 TABLET BY MOUTH ONCE DAILY.     Omega-3 Fatty Acids (Omega-3 Fish Oil) 500 MG Cap     Take by mouth     rosuvastatin  (CRESTOR) 5 MG tablet     TAKE ONE TABLET BY MOUTH ON MONDAY, WEDNESDAY, AND FRIDAY.     Sodium Hyaluronate, oral, (HYALURONIC ACID PO)     Hyaluronic Acid (chond-collgn)          Flagged for Removal               amoxicillin (AMOXIL) 500 MG capsule     amoxicillin 500 mg capsule   TAKE 4 CAPSULES BY MOUTH 1 HOUR PRIOR TO DENTAL VISIT     Patient not taking: Reported on 01/17/2022             Review of Systems   Constitutional:  Negative for chills and fever.   HENT:  Negative for congestion.    Eyes:  Positive for visual disturbance.   Respiratory:  Negative for shortness of breath.    Cardiovascular:  Negative for chest pain.   Gastrointestinal:  Positive for nausea. Negative for abdominal pain and vomiting.   Genitourinary:  Negative for dysuria.   Neurological:  Positive for dizziness and light-headedness. Negative for headaches.       Physical Exam    BP: (!) 213/96, Heart Rate: 63, Temp: 97.9 F (36.6 C), Resp Rate: 18, SpO2: 99 %, Weight: 79.8 kg     Physical Exam  Vitals and nursing note reviewed.   HENT:      Head: Normocephalic and atraumatic.      Mouth/Throat:      Pharynx: Oropharynx is clear.   Eyes:      Extraocular Movements: Extraocular movements intact.      Pupils: Pupils are equal, round, and reactive to light.      Comments: No nystagmus or diplopia  Gross visual fields intact   Cardiovascular:      Rate and Rhythm: Normal rate  and regular rhythm.      Heart sounds: Normal heart sounds.   Pulmonary:      Effort: Pulmonary effort is normal.      Breath sounds: Normal breath sounds.   Musculoskeletal:      Right lower leg: No edema.      Left lower leg: No edema.   Skin:     General: Skin is warm and dry.      Capillary Refill: Capillary refill takes less than 2 seconds.   Neurological:      General: No focal deficit present.      Mental Status: She is alert and oriented to person, place, and time.      Comments: No pronator drift  Finger-nose-finger intact  Speech fluent  Mentating normally  Leg  raising symmetric             MDM and ED Course     ED Medication Orders (From admission, onward)      Start Ordered     Status Ordering Provider    02/25/22 0745 02/25/22 0738  hydrALAZINE (APRESOLINE) injection 10 mg  Once in ED        Route: Intravenous  Ordered Dose: 10 mg     Last MAR action: Given Paschal Blanton D           EKG today shows a sinus rhythm rate 63.  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  There is late transition of R waves across the precordium.  The computer is reading ST elevation in the lateral leads I do not agree.  She does have some nonspecific T wave changes in the inferior leads.  It is compared to EKG of 10/29/2021 and there is no significant change.  Results       Procedure Component Value Units Date/Time    Urinalysis with Microscopic if Indicated [[id number]] Collected: 02/25/22 0803    Specimen: Urine, Random Updated: 02/25/22 0812     Color, UA Light-Yellow     Clarity, UA Clear     Urine Specific Gravity 1.015     pH, Urine 6.0 pH      Protein, UR Negative mg/dL      Glucose, UA Negative mg/dL      Ketones UA Negative mg/dL      Bilirubin, UA Negative     Blood, UA Negative     Nitrite, UA Negative     Urobilinogen, UA Normal mg/dL      Leukocyte Esterase, UA Negative Leu/uL     Troponin I [[id number]] Collected: 02/25/22 0713    Specimen: Plasma Updated: 02/25/22 0741     Troponin I 0.00 ng/mL     Comprehensive metabolic panel [[id number]]  (Abnormal) Collected: 02/25/22 0713    Specimen: Plasma Updated: 02/25/22 0739     Sodium 139 mMol/L      Potassium 3.7 mMol/L      Chloride 106 mMol/L      CO2 23 mMol/L      Calcium 9.9 mg/dL      Glucose 086 mg/dL      Creatinine 5.78 mg/dL      BUN 19 mg/dL      Protein, Total 7.0 gm/dL      Albumin 3.9 gm/dL      Alkaline Phosphatase 77 U/L      ALT 18 U/L      AST (SGOT) 17 U/L      Bilirubin, Total 0.4 mg/dL      Albumin/Globulin Ratio 1.26  Ratio      Anion Gap 13.7 mMol/L      BUN / Creatinine Ratio 21.3 Ratio      EGFR 67  mL/min/1.51m2      Osmolality Calculated 281 mOsm/kg      Globulin 3.1 gm/dL     CBC and differential [[id number]]  (Abnormal) Collected: 02/25/22 0713    Specimen: Blood Updated: 02/25/22 0723     WBC 6.2 K/cmm      RBC 5.04 M/cmm      Hemoglobin 15.7 gm/dL      Hematocrit 40.9 %      MCV 92 fL      MCH 31 pg      MCHC 34 gm/dL      RDW 81.1 %      PLT CT 212 K/cmm      MPV 8.3 fL      Neutrophils % 43.6 %      Lymphocytes 40.5 %      Monocytes 7.1 %      Eosinophils % 7.8 %      Basophils % 1.0 %      Neutrophils Absolute 2.7 K/cmm      Lymphocytes Absolute 2.5 K/cmm      Monocytes Absolute 0.4 K/cmm      Eosinophils Absolute 0.5 K/cmm      Basophils Absolute 0.1 K/cmm            Radiology Results (24 Hour)       Procedure Component Value Units Date/Time    CT Head WO Contrast [[id number]] Collected: 02/25/22 2130    Order Status: Completed Updated: 02/25/22 0827    Narrative:      Clinical History:  Hypertensive emergency  Visual disturbance, gait disturbance, hypertensive emergency    Ordering Comments:   None.      Study Notes:   None.     Examination:  CT HEAD WO CONTRAST    TECHNIQUE:  5 mm helical images obtained from the skull base through the vertex without contrast.  2.5 mm reconstructed bone algorithm, and 3 mm sagittal and coronal reformatted images provided.  All CT scans are performed using dose optimization techniques as   appropriate to the performed exam, including automated exposure control (AEC) and iterative reconstruction.     COMPARISON:   None available    FINDINGS:   Mild small vessel ischemic changes. No intracranial hemorrhage or mass effect. No hydrocephalus. No acute territorial infarct.    Mild ethmoid mucosal thickening. Mild-to-moderate left maxillary antral opacity, consisting of mucosal thickening and possibly fluid. No mastoid opacity seen.    Consider MRI follow-up, if symptoms persist.      Impression:      IMPRESSION: No intracranial hemorrhage or mass effect. No acute territorial  infarct visualized. Sinus disease. Follow-up as warranted.    ReadingStation:[id number]        Head CT interpreted by me.  No obvious intracerebral hemorrhage  Medical Decision Making  Differential of dizziness today includes benign positional vertigo, cerebellar stroke, uncontrolled hypertension, intracerebral bleed, and other etiologies to include electrolyte abnormality.        Problems Addressed:  Dizziness: undiagnosed new problem with uncertain prognosis  Hypertensive emergency without congestive heart failure: acute illness or injury that poses a threat to life or bodily functions    Amount and/or Complexity of Data Reviewed  Independent Historian:      Details: daughter- see hpi  External Data Reviewed: ECG and notes.  Details: Dr Silvestre Moment  Labs: ordered. Decision-making details documented in ED Course.  Radiology: ordered and independent interpretation performed. Decision-making details documented in ED Course.  ECG/medicine tests: ordered and independent interpretation performed. Decision-making details documented in ED Course.    Risk  Prescription drug management.          ED Course as of 02/25/22 0939   Sun Feb 25, 2022   5409 Fortunately today head CT does not show any evidence of hemorrhage or edema.  Her kidney function is normal her urinalysis is unremarkable.  Her blood pressure has slowly come down nicely with 10 mg of IV hydralazine.  She was able to get up to the bedside commode without dizziness.    Plan will be to discharge her home on Cardura as recommended in Dr. Janeann Forehand note [KK]      ED Course User Index  [KK] Manning Charity, MD         NIH Stroke Score      Flowsheet Row Most Recent Value   Patient's calculated Stroke Score: 0 filed at 02/25/2022 0659        Patient blood pressure came down to the 180 range.  I have explained to her that it is dangerous to drop her blood pressure too quickly.  Her dizziness symptoms have resolved with this blood pressure control.    We will start  Cardura 1 mg at bedtime.  Patient has the ability to check her blood pressure at home.    Today she had no symptoms of cardiac ischemia or CHF.  Her dizziness resolved so I think it was blood pressure related.        Procedures    Clinical Impression & Disposition     Clinical Impression  Final diagnoses:   Hypertensive emergency without congestive heart failure   Dizziness        ED Disposition       ED Disposition   Discharge    Condition   --    Date/Time   Sun Feb 25, 2022 8119    Comment   Christine Little discharge to home/self care.    Condition at disposition: Stable                  Discharge Medication List as of 02/25/2022  9:28 AM        START taking these medications    Details   doxazosin (CARDURA) 1 MG tablet Take 1 tablet (1 mg) by mouth nightly, Starting Sun 02/25/2022, E-Rx            This note was completed using dragon medical speech recognition software. Grammatical errors, random word insertions, pronoun errors, incorrect word insertion, misspellings  and incomplete sentences are occasional consequences of this technology due to software limitations. If there are questions or concerns about the content of this note or information contained within the body of this dictation they should be addressed with the provider for clarification.              Manning Charity, MD  02/25/22 [phone]

## 2022-02-25 NOTE — ED Notes (Signed)
Pt assisted to bedside commode for clean catch urine. Pt then assisted back into stretcher and taken to CT with radiology tech.

## 2022-02-25 NOTE — ED Notes (Signed)
Pt assisted to bedside commode

## 2022-02-25 NOTE — ED Notes (Signed)
Pt returned from CT and placed on purwik. Pt voiced no current concerns at this time.

## 2022-02-25 NOTE — Discharge Instructions (Signed)
Start cardura(doxazosin) at bedtime   F/U dr Maylene Roes next week for BP recheck - take you BP cuff for comparision

## 2022-02-25 NOTE — ED Notes (Signed)
Pt advised to attempt to relax as best she is able.

## 2022-02-25 NOTE — ED Notes (Signed)
Pt resting calmly in ED exam rm 2. AAOX4.

## 2022-02-28 ENCOUNTER — Telehealth: Payer: Self-pay

## 2022-02-28 NOTE — Telephone Encounter (Signed)
PCP participates in Epic. Updates requested in Care Everywhere. Patient has upcoming appointment 03/01/2022.

## 2022-02-28 NOTE — Progress Notes (Deleted)
Cardiology Follow-Up      Patient Name: Christine Little   Date of Birth: 1946/03/02    Provider: Lolita Patella, FNP     Patient Care Team:  Maylene Roes, Hipolito Bayley, MD as PCP - General (Family Medicine)    Chief Complaint: No chief complaint on file.      History of Present Illness   Ms. Grode is a 76 y.o. female being seen today for an earlier appointment r/t elevated BP.    She was evaluated by Dr. 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    She was in the ER 02/25/22 with hypertension with BP 170-180s/100s, dizziness, and visual changes.  She received IV hydralazine with improvement of BP. She was d/c home on Cardura.  CT of head-no intracranial hemorrhage or mass effect, no acute territorial infarct    Echo ordered  Stress test ordered  Sleep study    She is intolerant to Norvasc, Clonidine, Lisinopril and HCTZ.    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 This is a low risk study. Overall left ventricular systolic function was abnormal. Nuclear stress EF: 70%.     2. Hypertension  3. Hyperlipidemia  4. Malignant neoplasm of endometrium    10/29/21- Mag 1.7, WBC 6.2, RBC 5.01, HGB 14.5, HCT 45.7, MCV 91, PLT 202, Na 137, K 3.8, Cl 104, CO2 22, Ca 10.4, Glu 143, Creat 0.85, BUN 19, ALB 3.6, ALT 20, AST 17, GFR 71    11/24/21- A1c 5.6%, TSH 2.65, Chol 198, Trig 100, HDL 58, LDL 120    Past Surgical History     Past Surgical History:   Procedure Laterality Date    CHOLECYSTECTOMY  11/2017    HYSTERECTOMY      TOTAL KNEE ARTHROPLASTY Bilateral 04/2017       Family History     Family History   Problem Relation Age of Onset    Heart disease Mother     Cancer Mother     Breast cancer  Mother     Heart disease Father     Cancer Maternal Aunt     Breast cancer Maternal Aunt     Heart disease Maternal Grandmother     Diabetes Maternal Grandmother     Heart disease Maternal Grandfather     Heart disease Paternal Grandmother     Heart disease Paternal Grandfather        Social History     Social History     Tobacco Use    Smoking status: Never    Smokeless tobacco: Never   Vaping Use    Vaping status: Never Used   Substance Use Topics    Alcohol use: Never    Drug use: Never   She is moved to [place] in January of 2022. 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 She moved to [place] in 1999 to take care of her mother until she passed away. She moved back due to close to her daughter, grand daughter and two  great grand children. She is widowed since 2017.     Allergies     Allergies   Allergen Reactions    Bee Venom Anaphylaxis    Tobacco Anaphylaxis, Dizziness, Respiratory Distress and Shortness Of Breath    Atorvastatin Muscle Pain and Other (See Comments)     Body pain, loss of sleep  Unknown      Epinephrine     Hydrocodone Nausea Only    Molds & Smuts Other (See Comments) and Nausea And Vomiting     Severe headache and tearing of the eyes  Severe headache and tearing of the eyes      Oxycodone Nausea Only    Powder Scent Fragrance     Tramadol Other (See Comments) and Nausea And Vomiting     Unknown  Unknown      Hydrochlorothiazide Anxiety    Lisinopril Anxiety       Medications       Current Outpatient Medications:     amoxicillin (AMOXIL) 500 MG capsule, amoxicillin 500 mg capsule  TAKE 4 CAPSULES BY MOUTH 1 HOUR PRIOR TO DENTAL VISIT (Patient not taking: Reported on 01/17/2022), Disp: , Rfl:     Ascorbic Acid (vitamin C) 1000 MG tablet, Take 100 Units by mouth daily, Disp: , Rfl:     aspirin 81 MG EC tablet, aspirin 81 mg tablet,delayed release, Disp: , Rfl:     Calcium-Magnesium 300-300 MG Tab, Calcium Magnesium, Disp: , Rfl:     doxazosin (CARDURA) 1 MG tablet, Take 1 tablet  (1 mg) by mouth nightly, Disp: 30 tablet, Rfl: 0    LUTEIN PO, Take 1 capsule by mouth daily, Disp: , Rfl:     metoprolol succinate XL (TOPROL-XL) 25 MG 24 hr tablet, Take 1 tablet (25 mg) by mouth daily, Disp: 90 tablet, Rfl: 1    Misc Natural Products (GLUCOSAMINE CHOND MSM FORMULA PO), Take 1 tablet by mouth daily, Disp: , Rfl:     mometasone (ELOCON) 0.1 % ointment, Apply topically daily As directed., Disp: 45 g, Rfl: 1    Multiple Vitamin (Thera) Tab, Take 1 tablet by mouth daily, Disp: , Rfl:     olmesartan (BENICAR) 40 MG tablet, TAKE 1 TABLET BY MOUTH ONCE DAILY., Disp: 90 tablet, Rfl: 1    Omega-3 Fatty Acids (Omega-3 Fish Oil) 500 MG Cap, Take by mouth, Disp: , Rfl:     rosuvastatin (CRESTOR) 5 MG tablet, TAKE ONE TABLET BY MOUTH ON MONDAY, WEDNESDAY, AND FRIDAY., Disp: 90 tablet, Rfl: 1    Sodium Hyaluronate, oral, (HYALURONIC ACID PO), Hyaluronic Acid (chond-collgn), Disp: , Rfl:     Physical Exam   There were no vitals taken for this visit.  There were no vitals filed for this visit.  Wt Readings from Last 3 Encounters:   02/25/22 79.8 kg (176 lb)   01/17/22 80.3 kg (177 lb 1.6 oz)   12/29/21 81.2 kg (179 lb)        Constitutional -  Well appearing, female in no distress  Respiratory - Clear to auscultation bilaterally, normal respiratory effort    Cardiovascular system -    Regular rate and rhythm    Normal S1, S2    No murmurs, rubs, gallops   Jugular venous pulse is normal        Carotid upstroke normal, no carotid bruits auscultated   2+ pulses in the posterior tibial / dorsalis pedis bilaterally    Neurological - Alert, oriented, no focal neurological deficits  Extremities -  No clubbing or cyanosis. No peripheral edema  Skin - Warm and dry, no rashes  Psych- Appropriate affect    Labs     Lab Results   Component Value Date/Time    WBC 6.2 02/25/2022 07:13 AM    RBC 5.04 (H) 02/25/2022 07:13 AM    HGB 15.7 02/25/2022 07:13 AM    HCT 46.4 02/25/2022 07:13 AM    PLT 212 02/25/2022 07:13 AM       Lab  Results   Component Value Date/Time    TSH 2.65 11/24/2021 09:15 AM       Lab Results   Component Value Date/Time    NA 139 02/25/2022 07:13 AM    K 3.7 02/25/2022 07:13 AM    CL 106 02/25/2022 07:13 AM    CO2 23 02/25/2022 07:13 AM    GLU 118 (H) 02/25/2022 07:13 AM    BUN 19 02/25/2022 07:13 AM    CREAT 0.89 02/25/2022 07:13 AM    PROT 7.0 02/25/2022 07:13 AM    ALKPHOS 77 02/25/2022 07:13 AM    AST 17 02/25/2022 07:13 AM    ALT 18 02/25/2022 07:13 AM       Lab Results   Component Value Date/Time    CHOL 198 11/24/2021 09:15 AM    TRIG 100 11/24/2021 09:15 AM    HDL 58 11/24/2021 09:15 AM    LDL 120 11/24/2021 09:15 AM       Lab Results   Component Value Date/Time    HGBA1CPERCNT 5.6 11/24/2021 09:15 AM       Cardiogenics:     EKG:     Impression and Recommendations:     Hypertension    2.  Dizziness    3.  palpitations    PLAN:      Electronically signed by: Lolita Patella, FNP  02/28/2022

## 2022-03-01 ENCOUNTER — Encounter: Payer: No Typology Code available for payment source | Admitting: Nurse Practitioner

## 2022-03-01 ENCOUNTER — Telehealth: Payer: Self-pay

## 2022-03-01 ENCOUNTER — Emergency Department
Admission: EM | Admit: 2022-03-01 | Discharge: 2022-03-01 | Disposition: A | Discharge status: Home / Self Care | Payer: No Typology Code available for payment source | Attending: Family Medicine | Admitting: Family Medicine

## 2022-03-01 DIAGNOSIS — R42 Dizziness and giddiness: Secondary | ICD-10-CM | POA: Insufficient documentation

## 2022-03-01 DIAGNOSIS — I10 Essential (primary) hypertension: Secondary | ICD-10-CM | POA: Insufficient documentation

## 2022-03-01 DIAGNOSIS — G43009 Migraine without aura, not intractable, without status migrainosus: Secondary | ICD-10-CM | POA: Insufficient documentation

## 2022-03-01 LAB — ECG 12-LEAD
P Wave Axis: 10 deg
P-R Interval: 218 ms
Patient Age: 75 years
Q-T Interval(Corrected): 425 ms
Q-T Interval: 444 ms
QRS Axis: -29 deg
QRS Duration: 103 ms
T Axis: 0 years
Ventricular Rate: 55 //min

## 2022-03-01 MED ORDER — BUTALBITAL-APAP-CAFFEINE 50-325-40 MG PO TABS
1.0000 | ORAL_TABLET | Freq: Once | ORAL | Status: AC
Start: 2022-03-01 — End: 2022-03-01
  Administered 2022-03-01: 1 via ORAL
  Filled 2022-03-01: qty 1

## 2022-03-01 MED ORDER — MECLIZINE HCL 25 MG PO TABS
25.0000 mg | ORAL_TABLET | Freq: Four times a day (QID) | ORAL | 0 refills | Status: DC | PRN
Start: 2022-03-01 — End: 2022-05-18

## 2022-03-01 MED ORDER — AMLODIPINE BESYLATE 5 MG PO TABS
5.0000 mg | ORAL_TABLET | Freq: Every day | ORAL | Status: DC
Start: 2022-03-01 — End: 2022-03-01
  Administered 2022-03-01: 5 mg via ORAL
  Filled 2022-03-01: qty 1

## 2022-03-01 MED ORDER — MECLIZINE HCL 12.5 MG PO TABS
25.0000 mg | ORAL_TABLET | Freq: Once | ORAL | Status: AC
Start: 2022-03-01 — End: 2022-03-01
  Administered 2022-03-01: 25 mg via ORAL
  Filled 2022-03-01 (×2): qty 2

## 2022-03-01 MED ORDER — BUTALBITAL-APAP-CAFFEINE 50-325-40 MG PO TABS
1.0000 | ORAL_TABLET | ORAL | 0 refills | Status: AC | PRN
Start: 2022-03-01 — End: 2022-03-11

## 2022-03-01 MED ORDER — AMLODIPINE BESYLATE 5 MG PO TABS
5.0000 mg | ORAL_TABLET | Freq: Every day | ORAL | 0 refills | Status: DC
Start: 2022-03-01 — End: 2022-03-13

## 2022-03-01 NOTE — ED Provider Notes (Signed)
[facility] EMERGENCY DEPT             Patient Name: Christine Little, Christine Little Patient DOB:  November 16, 1946   Encounter Date:  03/01/2022 Age: 76 y.o. female   Attending ED Physician: Tyson Dense, MD MRN:  [id number]   Room:  ED21/ED21-A PCP: Shanna Cisco, MD      Diagnosis / Disposition:   Final Impression  1. Migraine without aura and without status migrainosus, not intractable    2. Benign essential HTN        Disposition              ED Disposition       ED Disposition   Discharge    Condition   --    Date/Time   Thu Mar 01, 2022  4:18 PM    Comment   Truly Stankiewicz discharge to home/self care.    Condition at disposition: Stable                 Follow up  [place], [place]  [address] [address]  [place] [address]  [phone]    In 3 days  For reevaluation and further management as needed.    [facility] Emergency Department  [address] [place] [address]  [phone]  In 1 day  If symptoms worsen      Prescriptions  New Prescriptions    AMLODIPINE (NORVASC) 5 MG TABLET    Take 1 tablet (5 mg) by mouth daily    BUTALBITAL-ACETAMINOPHEN-CAFFEINE (FIORICET) 50-325-40 MG PER TABLET    Take 1-2 tablets by mouth every 4 (four) hours as needed for Pain    MECLIZINE (ANTIVERT) 25 MG TABLET    Take 1 tablet (25 mg) by mouth every 6 (six) hours as needed for Dizziness         History of Presenting Illness:   Chief complaint: Headache and Hypertension      HPI:The patient is a 76 year old who came to the ED with a CC of headache and htn. The patient reports that her blood pressure has been labile and she was placed on cardura 1 mg4 days prior to this. She reports that he has dizziness and is unable to tolerate the cardura. The patient reports that 4 days prior to this she has an episode of dizziness and she felt like she was on a boat. She reports that it happens intermittently. She came today because she has a headache to the right side dull in nature with mild shooting  stabbing pain intermittently to bilateral temporal regions. She denies any sob, cp, abd pain or any other acute symptoms.          No notes on file    In addition to the above history, please see nursing notes. Allergies, meds, past medical, family, social hx, and the results of the diagnostic studies performed have been reviewed by myself.      Review of Systems   Review of Systems   Constitutional:  Negative for fatigue and fever.   HENT:  Negative for congestion, ear pain and facial swelling.    Cardiovascular:  Negative for chest pain and palpitations.   Gastrointestinal:  Negative for abdominal distention and blood in stool.   Musculoskeletal:  Negative for arthralgias and gait problem.   Neurological:  Positive for dizziness and headaches. Negative for facial asymmetry.   All other systems reviewed and are negative.      All  other systems reviewed and negative except as above, pertinent findings in HPI.        Allergies / Medications:   Pt is allergic to bee venom, tobacco, atorvastatin, epinephrine, hydrocodone, molds & smuts, oxycodone, powder scent fragrance, tramadol, hydrochlorothiazide, and lisinopril.    Current/Home Medications    AMOXICILLIN (AMOXIL) 500 MG CAPSULE    amoxicillin 500 mg capsule   TAKE 4 CAPSULES BY MOUTH 1 HOUR PRIOR TO DENTAL VISIT    ASCORBIC ACID (VITAMIN C) 1000 MG TABLET    Take 100 Units by mouth daily    ASPIRIN 81 MG EC TABLET    aspirin 81 mg tablet,delayed release    CALCIUM-MAGNESIUM 300-300 MG TAB    Calcium Magnesium    DOXAZOSIN (CARDURA) 1 MG TABLET    Take 1 tablet (1 mg) by mouth nightly    LUTEIN PO    Take 1 capsule by mouth daily    METOPROLOL SUCCINATE XL (TOPROL-XL) 25 MG 24 HR TABLET    Take 1 tablet (25 mg) by mouth daily    MISC NATURAL PRODUCTS (GLUCOSAMINE CHOND MSM FORMULA PO)    Take 1 tablet by mouth daily    MOMETASONE (ELOCON) 0.1 % OINTMENT    Apply topically daily As directed.    MULTIPLE VITAMIN (THERA) TAB    Take 1 tablet by mouth daily     OLMESARTAN (BENICAR) 40 MG TABLET    TAKE 1 TABLET BY MOUTH ONCE DAILY.    OMEGA-3 FATTY ACIDS (OMEGA-3 FISH OIL) 500 MG CAP    Take by mouth    ROSUVASTATIN (CRESTOR) 5 MG TABLET    TAKE ONE TABLET BY MOUTH ON MONDAY, WEDNESDAY, AND FRIDAY.    SODIUM HYALURONATE, ORAL, (HYALURONIC ACID PO)    Hyaluronic Acid (chond-collgn)         Past History:   Medical: Pt has a past medical history of Primary malignant neoplasm of endometrium.    Surgical: Pt  has a past surgical history that includes Hysterectomy; Total knee arthroplasty (Bilateral, 04/2017); and Cholecystectomy (11/2017).    Family: The family history includes Breast cancer in her maternal aunt and mother; Cancer in her maternal aunt and mother; Diabetes in her maternal grandmother; Heart disease in her father, maternal grandfather, maternal grandmother, mother, paternal grandfather, and paternal grandmother.    Social: Pt reports that she has never smoked. She has never used smokeless tobacco. She reports that she does not drink alcohol and does not use drugs.      Physical Exam:   Physical Exam  Vitals and nursing note reviewed.   Constitutional:       Appearance: Normal appearance. She is not toxic-appearing.   HENT:      Head: Normocephalic and atraumatic.      Mouth/Throat:      Mouth: Mucous membranes are moist.      Pharynx: Oropharynx is clear.   Eyes:      Extraocular Movements: Extraocular movements intact.      Pupils: Pupils are equal, round, and reactive to light.   Cardiovascular:      Rate and Rhythm: Normal rate and regular rhythm.      Pulses: Normal pulses.   Pulmonary:      Effort: Pulmonary effort is normal. No respiratory distress.      Breath sounds: Normal breath sounds.   Musculoskeletal:         General: No tenderness or deformity.   Skin:     General: Skin is warm  and dry.   Neurological:      General: No focal deficit present.      Mental Status: She is alert and oriented to person, place, and time.      Cranial Nerves: No cranial  nerve deficit.      Sensory: No sensory deficit.   Psychiatric:         Mood and Affect: Mood normal.         Behavior: Behavior normal.            ED Vital Signs   Vitals:    03/01/22 1330 03/01/22 1400 03/01/22 1430 03/01/22 1600   BP: 150/85 182/74 (!) 168/91 179/80   Pulse:       Resp:       Temp:       SpO2: 99% 98% 97% 99%   Weight:       Height:                 MDM:   Medical Decision Making  Differential diagnoses include but are not limited to migraine headache/cluster headache/hypertensive urgency/essential hypertension/noncompliance.  Her blood pressure has mild to moderate elevation, nothing that is concerning at this point.  She does not want to take the Cardura.  Patient started on amlodipine, and given meclizine and Fioricet.    Patient has had a significant alleviation of her headache, I have counseled her regarding compliance with her medication and checking her blood pressure and keeping a diary.  She needs to follow-up with Dr. Maylene Roes for medication reconciliation.  The patient is having headache that is typical for them.  No sudden onset, not maximal onset of the worst headache of her life.  No neck stiffness or fever were present on exam.  No worrisome signs or symptoms to suggest subarachnoid hemorrhage or meningitis.  Patient had a completely benign exam and on reexamination is comfortable and feels safe to be discharged home.    Amount and/or Complexity of Data Reviewed  Labs: ordered.  ECG/medicine tests: ordered.    Risk  Prescription drug management.       4:19 PM      Course in ED:             Diagnostic Results:   The results of the diagnostic studies have been reviewed by myself:    Radiologic Studies  No results found.    Lab Studies  Lab Results    None               EKG:           Procedure / Critical Care time/EKG:   Procedures    Total time providing critical care:     ATTESTATIONS   This chart was generated by an EMR and may contain errors or additions/omissions not intended by  the user.           Tyson Dense, MD                 Tyson Dense, MD  03/01/22 [phone]

## 2022-03-01 NOTE — Telephone Encounter (Signed)
Just a FYI, pt cancelled appt for today at 11:20 am.     Thanks,  Restaurant manager, fast food, CNA

## 2022-03-01 NOTE — ED Notes (Signed)
Pt noted visual changes and palpitations with addition of Cardura 1mg  prescribed 02/25/2022. Pt stated she is also experiencing nausea and dizziness described as "walking on a boat." Pt's pupils equal and reactive w/ no nystagmus noted.

## 2022-03-01 NOTE — Telephone Encounter (Signed)
Received call from patient reporting that she is "feeling on the edge of a health crisis." She reports that she has been having gradually increasing BP from 160s/90s to 170/85. Patient also endorses HR "racing" at 110, dizziness, nausea, headache, difficulty walking. Patient states that she is considering going to the ER, as she cannot make appointment today with Liborio Nixon due to other reasons. Advised that she should be seen at closest ED, [facility]. Patient also requesting medication adjustment advice by Dr. Tressia Danas team. Advised that this can be done after ED visit, as they will likely change her medications. Patient agreeable, appointment canceled at this time.    Gerrit Halls, RN

## 2022-03-05 NOTE — Progress Notes (Signed)
No previous Echo or Stress Echo

## 2022-03-08 ENCOUNTER — Other Ambulatory Visit: Payer: No Typology Code available for payment source

## 2022-03-08 ENCOUNTER — Other Ambulatory Visit
Admission: RE | Admit: 2022-03-08 | Discharge: 2022-03-08 | Disposition: A | Discharge status: Home / Self Care | Payer: No Typology Code available for payment source | Source: Ambulatory Visit | Attending: Family Medicine | Admitting: Family Medicine

## 2022-03-08 DIAGNOSIS — M7021 Olecranon bursitis, right elbow: Secondary | ICD-10-CM

## 2022-03-08 DIAGNOSIS — M7541 Impingement syndrome of right shoulder: Secondary | ICD-10-CM

## 2022-03-08 DIAGNOSIS — I10 Essential (primary) hypertension: Secondary | ICD-10-CM

## 2022-03-08 DIAGNOSIS — Z23 Encounter for immunization: Secondary | ICD-10-CM

## 2022-03-08 DIAGNOSIS — E785 Hyperlipidemia, unspecified: Secondary | ICD-10-CM

## 2022-03-08 DIAGNOSIS — Z8542 Personal history of malignant neoplasm of other parts of uterus: Secondary | ICD-10-CM

## 2022-03-08 LAB — CBC AND DIFFERENTIAL
Basophils %: 0.1 % (ref 0.0–3.0)
Basophils Absolute: 0 10*3/uL (ref 0.0–0.3)
Eosinophils %: 3.5 % (ref 0.0–7.0)
Eosinophils Absolute: 0.2 10*3/uL (ref 0.0–0.8)
Hematocrit: 46.5 % (ref 36.0–48.0)
Hemoglobin: 14.9 gm/dL (ref 12.0–16.0)
Lymphocytes Absolute: 2.1 10*3/uL (ref 0.6–5.1)
Lymphocytes: 31.6 % (ref 15.0–46.0)
MCH: 30 pg (ref 28–35)
MCHC: 32 gm/dL (ref 32–36)
MCV: 93 fL (ref 80–100)
MPV: 9.8 fL (ref 6.0–10.0)
Monocytes Absolute: 0.4 10*3/uL (ref 0.1–1.7)
Monocytes: 6.8 % (ref 3.0–15.0)
Neutrophils %: 58 % (ref 42.0–78.0)
Neutrophils Absolute: 3.8 10*3/uL (ref 1.7–8.6)
PLT CT: 207 10*3/uL (ref 130–440)
RBC: 5.01 10*6/uL — ABNORMAL HIGH (ref 3.80–5.00)
RDW: 12.5 % (ref 11.0–14.0)
WBC: 6.6 10*3/uL (ref 4.0–11.0)

## 2022-03-08 LAB — VH MICROALBUMIN / CREATININE RATIO
Microalbumin-Random, U: 13.4 ug/mL (ref 0.0–20.0)
Microalbumin/Creatinine Ratio: 6 mg/gm
Urine Creatinine Random: 216 mg/dL

## 2022-03-08 LAB — HEPATITIS C ANTIBODY: Hepatitis C Antibody: NONREACTIVE

## 2022-03-08 LAB — COMPREHENSIVE METABOLIC PANEL
ALT: 17 U/L (ref 0–55)
AST (SGOT): 18 U/L (ref 10–42)
Albumin/Globulin Ratio: 1.46 Ratio (ref 0.80–2.00)
Albumin: 4.1 gm/dL (ref 3.5–5.0)
Alkaline Phosphatase: 89 U/L (ref 40–145)
Anion Gap: 12.3 mMol/L (ref 7.0–18.0)
BUN / Creatinine Ratio: 23 Ratio (ref 10.0–30.0)
BUN: 20 mg/dL (ref 7–22)
Bilirubin, Total: 0.7 mg/dL (ref 0.1–1.2)
CO2: 25 mMol/L (ref 20–30)
Calcium: 10.1 mg/dL (ref 8.5–10.5)
Chloride: 106 mMol/L (ref 98–110)
Creatinine: 0.87 mg/dL (ref 0.60–1.20)
EGFR: 69 mL/min/{1.73_m2} (ref 60–150)
Globulin: 2.8 gm/dL (ref 2.0–4.0)
Glucose: 109 mg/dL — ABNORMAL HIGH (ref 71–99)
Osmolality Calculated: 281 mOsm/kg (ref 275–300)
Potassium: 4.3 mMol/L (ref 3.5–5.3)
Protein, Total: 6.9 gm/dL (ref 6.0–8.3)
Sodium: 139 mMol/L (ref 136–147)

## 2022-03-08 LAB — LIPID PANEL
Cholesterol: 161 mg/dL (ref 75–199)
Coronary Heart Disease Risk: 3.22
HDL: 50 mg/dL (ref 45–65)
LDL Calculated: 96 mg/dL
Triglycerides: 73 mg/dL (ref 10–150)
VLDL: 15 (ref 0–40)

## 2022-03-08 LAB — THYROID STIMULATING HORMONE (TSH), REFLEX ON ABNORMAL TO FREE T4, SERUM: TSH: 1.71 u[IU]/mL (ref 0.40–4.20)

## 2022-03-08 LAB — HEMOGLOBIN A1C
Estimated Average Glucose: 105 mg/dL
Hgb A1C, %: 5.3 %

## 2022-03-10 ENCOUNTER — Encounter (INDEPENDENT_AMBULATORY_CARE_PROVIDER_SITE_OTHER): Payer: Self-pay

## 2022-03-12 ENCOUNTER — Ambulatory Visit: Payer: No Typology Code available for payment source

## 2022-03-12 MED ORDER — VH TECHNETIUM TC 99 SESTAMIBI (WRAP)
40.0000 | Freq: Once | Status: AC
Start: 2022-03-12 — End: 2022-03-12
  Administered 2022-03-12: 40 via INTRAVENOUS

## 2022-03-13 ENCOUNTER — Encounter (RURAL_HEALTH_CENTER): Payer: Self-pay | Admitting: Family Medicine

## 2022-03-13 ENCOUNTER — Ambulatory Visit: Payer: No Typology Code available for payment source | Attending: Family Medicine | Admitting: Family Medicine

## 2022-03-13 ENCOUNTER — Telehealth: Payer: Self-pay

## 2022-03-13 VITALS — BP 124/86 | HR 64 | Temp 97.3°F | Resp 16 | Ht 63.0 in | Wt 173.0 lb

## 2022-03-13 DIAGNOSIS — I10 Essential (primary) hypertension: Secondary | ICD-10-CM

## 2022-03-13 DIAGNOSIS — I471 Supraventricular tachycardia: Secondary | ICD-10-CM

## 2022-03-13 DIAGNOSIS — R42 Dizziness and giddiness: Secondary | ICD-10-CM

## 2022-03-13 DIAGNOSIS — Z8542 Personal history of malignant neoplasm of other parts of uterus: Secondary | ICD-10-CM

## 2022-03-13 DIAGNOSIS — R9431 Abnormal electrocardiogram [ECG] [EKG]: Secondary | ICD-10-CM | POA: Insufficient documentation

## 2022-03-13 DIAGNOSIS — E785 Hyperlipidemia, unspecified: Secondary | ICD-10-CM

## 2022-03-13 MED ORDER — AMLODIPINE BESYLATE 2.5 MG PO TABS
2.5000 mg | ORAL_TABLET | Freq: Every day | ORAL | 1 refills | Status: DC
Start: ? — End: 2022-03-13

## 2022-03-13 NOTE — Telephone Encounter (Signed)
-----   Message from Carlyon Shadow, MD sent at 03/13/2022  8:40 AM EDT -----  NUC stress  No ischemia  EF normal  But   She had narrow complex tachycardia  Need to see  EP for eval  May just need more BB but may need to consider ablation  Please get appt with Dr Emeline General  ----- Message -----  From: Leory Plowman, [facility] Results In  Sent: 03/12/2022   2:14 PM EDT  To: Carlyon Shadow, MD

## 2022-03-13 NOTE — Progress Notes (Addendum)
Progress Note      Patient Name:  Christine Little    Subjective/HPI Comments:    Patient is a  76 y.o. female who presents with hypertension.  She was unable to take the 5 mg of the amlodipine and she has been breaking that in half and taking 2.5 mg, and is doing quite well with that dosage.  Her blood pressure today is 124/86.  She would like the 2.5 mg of the amlodipine sent to her mail order pharmacy.  Chief Complaint   Patient presents with    Follow-up     Follow up, having BP issues and has gone to ER twice, was told it was a hypertensive crisis both times. Having a lot of vertigo     She said she would like to see PT for the various exercises for vertigo and dizziness.  Outpatient Medications Marked as Taking for the 03/13/22 encounter (Office Visit) with Ival Pacer, Hipolito Bayley, MD   Medication Sig Dispense Refill    amLODIPine (NORVASC) 5 MG tablet Take 1 tablet (5 mg) by mouth daily 30 tablet 0    aspirin 81 MG EC tablet aspirin 81 mg tablet,delayed release      metoprolol succinate XL (TOPROL-XL) 25 MG 24 hr tablet Take 1 tablet (25 mg) by mouth daily 90 tablet 1    mometasone (ELOCON) 0.1 % ointment Apply topically daily As directed. 45 g 1    olmesartan (BENICAR) 40 MG tablet TAKE 1 TABLET BY MOUTH ONCE DAILY. 90 tablet 1    Omega-3 Fatty Acids (Omega-3 Fish Oil) 500 MG Cap Take by mouth      rosuvastatin (CRESTOR) 5 MG tablet TAKE ONE TABLET BY MOUTH ON MONDAY, WEDNESDAY, AND FRIDAY. 90 tablet 1     Current Facility-Administered Medications for the 03/13/22 encounter (Office Visit) with Anandi Abramo, Hipolito Bayley, MD   Medication Dose Route Frequency Provider Last Rate Last Admin    [COMPLETED] technetium sestamibi injection 40 millicurie  40 millicurie Intravenous Once Janifer Adie   40 millicurie at 03/12/22 1400     Allergies   Allergen Reactions    Bee Venom Anaphylaxis    Tobacco Anaphylaxis, Dizziness, Respiratory Distress and Shortness Of Breath    Atorvastatin Muscle Pain and Other (See Comments)      Body pain, loss of sleep  Unknown      Epinephrine     Hydrocodone Nausea Only    Molds & Smuts Other (See Comments) and Nausea And Vomiting     Severe headache and tearing of the eyes  Severe headache and tearing of the eyes      Oxycodone Nausea Only    Powder Scent Fragrance     Tramadol Other (See Comments) and Nausea And Vomiting     Unknown  Unknown      Hydrochlorothiazide Anxiety    Lisinopril Anxiety     Past Medical History:   Diagnosis Date    Primary malignant neoplasm of endometrium      Past Surgical History:   Procedure Laterality Date    CHOLECYSTECTOMY  11/2017    HYSTERECTOMY      TOTAL KNEE ARTHROPLASTY Bilateral 04/2017     Social History     Occupational History    Occupation: retired   Tobacco Use    Smoking status: Never    Smokeless tobacco: Never   Vaping Use    Vaping status: Never Used   Substance and Sexual Activity    Alcohol use: Never  Drug use: Never    Sexual activity: Not on file     Family History   Problem Relation Age of Onset    Heart disease Mother     Cancer Mother     Breast cancer Mother     Heart disease Father     Cancer Maternal Aunt     Breast cancer Maternal Aunt     Heart disease Maternal Grandmother     Diabetes Maternal Grandmother     Heart disease Maternal Grandfather     Heart disease Paternal Grandmother     Heart disease Paternal Grandfather        Review of Systems:  Review of Systems   Constitutional:  Negative for chills, diaphoresis, fever, malaise/fatigue and weight loss.   HENT:  Negative for congestion, ear discharge, ear pain, sinus pain and sore throat.    Eyes:  Negative for blurred vision, pain, discharge and redness.   Respiratory:  Negative for cough, shortness of breath and wheezing.    Cardiovascular:  Negative for chest pain, palpitations and orthopnea.   Gastrointestinal:  Negative for abdominal pain, diarrhea, nausea and vomiting.   Genitourinary:  Negative for dysuria, frequency and urgency.   Musculoskeletal:  Negative for joint pain and  myalgias.   Skin:  Negative for rash.   Neurological:  Negative for dizziness, tingling, weakness and headaches.   Endo/Heme/Allergies:  Negative for environmental allergies and polydipsia. Does not bruise/bleed easily.   Psychiatric/Behavioral:  Negative for depression and suicidal ideas. The patient is not nervous/anxious and does not have insomnia.        Objective/ Physical Exam:  Vitals:    03/13/22 1137   BP: 124/86   BP Site: Left arm   Patient Position: Sitting   Cuff Size: Large   Pulse: 64   Resp: 16   Temp: 97.3 F (36.3 C)   TempSrc: Temporal   SpO2: 96%   Weight: 78.5 kg (173 lb)   Height: 1.6 m (5\' 3" )     Body mass index is 30.65 kg/m.  Pain Score: 2-mild pain    Physical Exam  Vitals reviewed.   Constitutional:       General: She is not in acute distress.     Appearance: She is well-developed. She is not diaphoretic.   HENT:      Head: Normocephalic and atraumatic.      Right Ear: Tympanic membrane, ear canal and external ear normal.      Left Ear: Tympanic membrane, ear canal and external ear normal.      Nose: Nose normal.   Eyes:      Conjunctiva/sclera: Conjunctivae normal.      Pupils: Pupils are equal, round, and reactive to light.   Neck:      Vascular: No carotid bruit.   Cardiovascular:      Rate and Rhythm: Normal rate and regular rhythm.      Heart sounds: Normal heart sounds. No murmur heard.     No friction rub. No gallop.   Pulmonary:      Effort: Pulmonary effort is normal. No respiratory distress.      Breath sounds: Normal breath sounds. No wheezing or rales.   Abdominal:      General: Bowel sounds are normal. There is no distension.      Palpations: Abdomen is soft.      Tenderness: There is no abdominal tenderness.   Musculoskeletal:         General: Normal range  of motion.   Skin:     General: Skin is warm and dry.   Neurological:      Mental Status: She is alert and oriented to person, place, and time.   Psychiatric:         Behavior: Behavior normal.         Thought Content:  Thought content normal.         Judgment: Judgment normal.       Assessment:    1. Dizziness    2. Primary hypertension    3. History of endometrial cancer    4. Dyslipidemia        Plan:  Orders Placed This Encounter   Procedures    Ambulatory referral to Physical Therapy     Standing Status:   Future     Standing Expiration Date:   03/14/2023     Referral Priority:   Routine     Referral Type:   Consultation     Referral Reason:   Specialty Services Required     Requested Specialty:   Physical Therapy     Number of Visits Requested:   1     Outpatient Encounter Medications as of 03/13/2022   Medication Sig Dispense Refill    aspirin 81 MG EC tablet aspirin 81 mg tablet,delayed release      metoprolol succinate XL (TOPROL-XL) 25 MG 24 hr tablet Take 1 tablet (25 mg) by mouth daily 90 tablet 1    mometasone (ELOCON) 0.1 % ointment Apply topically daily As directed. 45 g 1    olmesartan (BENICAR) 40 MG tablet TAKE 1 TABLET BY MOUTH ONCE DAILY. 90 tablet 1    Omega-3 Fatty Acids (Omega-3 Fish Oil) 500 MG Cap Take by mouth      rosuvastatin (CRESTOR) 5 MG tablet TAKE ONE TABLET BY MOUTH ON MONDAY, WEDNESDAY, AND FRIDAY. 90 tablet 1    [DISCONTINUED] amLODIPine (NORVASC) 5 MG tablet Take 1 tablet (5 mg) by mouth daily 30 tablet 0    amLODIPine (NORVASC) 2.5 MG tablet Take 1 tablet (2.5 mg) by mouth daily 90 tablet 1    amoxicillin (AMOXIL) 500 MG capsule amoxicillin 500 mg capsule   TAKE 4 CAPSULES BY MOUTH 1 HOUR PRIOR TO DENTAL VISIT (Patient not taking: Reported on 01/17/2022)      Ascorbic Acid (vitamin C) 1000 MG tablet Take 100 Units by mouth daily (Patient not taking: Reported on 03/13/2022)      Calcium-Magnesium 300-300 MG Tab Calcium Magnesium (Patient not taking: Reported on 03/13/2022)      doxazosin (CARDURA) 1 MG tablet Take 1 tablet (1 mg) by mouth nightly 30 tablet 0    LUTEIN PO Take 1 capsule by mouth daily (Patient not taking: Reported on 03/13/2022)      meclizine (ANTIVERT) 25 MG tablet Take 1 tablet (25  mg) by mouth every 6 (six) hours as needed for Dizziness (Patient not taking: Reported on 03/13/2022) 20 tablet 0    Misc Natural Products (GLUCOSAMINE CHOND MSM FORMULA PO) Take 1 tablet by mouth daily (Patient not taking: Reported on 03/13/2022)      Multiple Vitamin (Thera) Tab Take 1 tablet by mouth daily (Patient not taking: Reported on 03/13/2022)      Sodium Hyaluronate, oral, (HYALURONIC ACID PO) Hyaluronic Acid (chond-collgn) (Patient not taking: Reported on 03/13/2022)       Facility-Administered Encounter Medications as of 03/13/2022   Medication Dose Route Frequency Provider Last Rate Last Admin    [COMPLETED] technetium  sestamibi injection 40 millicurie  40 millicurie Intravenous Once Janifer Adie   40 millicurie at 03/12/22 1400           The patient has been informed of all ordered tests and/or consults, if applicable.  All patient concerns and questions have been addressed.      Shanna Cisco, MD  [facility]  [facility] [place]  [phone]  [place] [address]  [phone]    This note was generated by the Epic EMR system/ Dragon speech recognition and may contain inherent errors or omissions not intended by the user. Grammatical errors, random word insertions, deletions, pronoun errors and incomplete sentences are occasional consequences of this technology due to software limitations. Not all errors are caught or corrected. If there are questions or concerns about the content of this note or information contained within the body of this dictation they should be addressed directly with the author for clarification

## 2022-03-13 NOTE — Telephone Encounter (Signed)
-----   Message from Carlyon Shadow, MD sent at 03/13/2022  8:39 AM EDT -----  Echo looks great  EF 60-65%  mild AR  no significant valvular disease  ----- Message -----  From: [facility] Results In  Sent: 03/12/2022   1:51 PM EDT  To: Carlyon Shadow, MD

## 2022-03-13 NOTE — Telephone Encounter (Signed)
Spoke to patient. Relayed echo results on behalf of [facility]. Pt voiced understanding, no questions or concerns at this time.       Apainter,R.M.A.

## 2022-03-13 NOTE — Addendum Note (Signed)
Addended by: Francisca December on: 03/13/2022 12:19 PM     Modules accepted: Orders

## 2022-03-13 NOTE — Telephone Encounter (Signed)
Spoke to patient. Relayed NUC stress test results on behalf of [facility]. Placed order for EP and discussed with patient. Pt voiced understanding, no questions or concerns at this time.       Jon Gills,  Please call patient ans schedule EP consult with Dr. Laurell Josephs.      Thank you  Apainter,R.M.A.

## 2022-04-02 ENCOUNTER — Ambulatory Visit: Payer: No Typology Code available for payment source | Attending: Family Medicine

## 2022-04-02 DIAGNOSIS — R42 Dizziness and giddiness: Secondary | ICD-10-CM | POA: Insufficient documentation

## 2022-04-13 ENCOUNTER — Ambulatory Visit: Payer: No Typology Code available for payment source | Attending: Dermatology | Admitting: Dermatology

## 2022-04-13 ENCOUNTER — Telehealth: Payer: Self-pay

## 2022-04-13 ENCOUNTER — Encounter (RURAL_HEALTH_CENTER): Payer: Self-pay | Admitting: Dermatology

## 2022-04-13 DIAGNOSIS — L814 Other melanin hyperpigmentation: Secondary | ICD-10-CM

## 2022-04-13 DIAGNOSIS — D1801 Hemangioma of skin and subcutaneous tissue: Secondary | ICD-10-CM

## 2022-04-13 DIAGNOSIS — L821 Other seborrheic keratosis: Secondary | ICD-10-CM

## 2022-04-13 DIAGNOSIS — D2271 Melanocytic nevi of right lower limb, including hip: Secondary | ICD-10-CM

## 2022-04-13 NOTE — Telephone Encounter (Signed)
PCP participates in EPIC, updates were requested in Care Everywhere and upcoming appointment on 05.25.23

## 2022-04-13 NOTE — Progress Notes (Signed)
[facility]  [facility]  [address]  [address], [address]  [place] [id number]  Dept: [phone]  Dept Fax: [phone]          Skin Evaluation Progress Note            ID: Christine Little   DOB: 06/23/1946  Primary Care: Maylene Roesressler, Hipolito BayleyWilliam Conrad, MD        CC:    Chief Complaint   Patient presents with    New Visit     Excessive sun exposure in the past- hx of multiple blistering sun burn. No hx of skin ca- endometrial ca.- father hx of skin ca. No hx of tanning bed use.  Protects skin from sun now.           HPI:   Christine Little is a 76 y.o. female who presents today for skin check, h/o multiple blistering sunburns in the past.  [place]  No hx of skin ca- endometrial ca.- father hx of skin ca. No hx of tanning bed use.  Protects skin from sun now.    PMH:   Past Medical History:   Diagnosis Date    Primary malignant neoplasm of endometrium      Patient Active Problem List    Diagnosis Date Noted    Abnormal ECG 03/13/2022    History of endometrial cancer 12/29/2021    Dyslipidemia 12/29/2021    Primary hypertension 12/29/2021    Olecranon bursitis, right elbow 12/07/2021    Impingement syndrome of right shoulder 12/07/2021       MEDICATIONS: As listed in medication list tab.  Reviewed.    SOCIAL HISTORY:   reports that she has never smoked. She has never used smokeless tobacco. She reports that she does not drink alcohol and does not use drugs.    PHYSICAL EXAM:  There were no vitals taken for this visit.    GENERAL:  Alert, No acute distress  SKIN: I inspected the full skin from scalp to toes including the following areas: hair/scalp, head, including face, lips, neck, R upper extremity, L upper extremity, nails and digits, chest, abdomen, back, R lower extremity, L lower extremity, buttocks (but not genitalia and perianal).   And then palpated any of these areas with skin changes. The only conditions of note are  described below:    Many seborrheic keratoses on the back and inframammary skin  Cherry angiomas on the scalp and abdomen and extremities  Lentigo on the shoulders  Scar L medial thigh from injury in 7th grade    Nevus on the R medial thigh 5mm, has been there for years but not her whole life, no noted changes                  ASSESSMENT/PLAN:    1. Seborrheic keratosis      2. Cherry angioma      3. Solar lentigo      4. Nevus of right thigh  Will monitor      No skin lesions suspicious for malignancy.  Reviewed sun protection, information given on ABCDEs of melanoma.  Sun protection, including use of over the counter sunscreen at least spf 30 and clothing            Return in about 1 year (around 04/14/2023) for annual skin exam, risk factors for skin cancer .    Patient Instructions  What sunscreen should I use?     The American Academy of Dermatology recommends everyone use sunscreen that offers the following:    Broad-spectrum protection (protects against UVA and UVB rays)   Sun Protection Factor (SPF) 30 or higher   Water resistance    A sunscreen that offers the above helps to protect your skin from sunburn, early skin aging and skin cancer. However, sunscreen alone cannot fully protect you. In addition to wearing sunscreen, dermatologists recommend taking the following steps to protect your skin and find skin cancer early:    Seek shade when appropriate, remembering that the sun's rays are strongest between 10 a.m. and 2 p.m. If your shadow is shorter than you are, seek shade.  Wear protective clothing, such as a long-sleeved shirt, pants, a wide-brimmed hat and sunglasses, when possible.   Use extra caution near water, snow and sand as they reflect the damaging rays of the sun, which can increase your chance of sunburn.   Get vitamin D safely through a healthy diet that may include vitamin supplements. Don't seek the sun.  Avoid tanning beds. Ultraviolet light from the sun and tanning beds can cause skin  cancer and wrinkling. If you want to look tan, you may wish to use a self-tanning product, but continue to use sunscreen with it.  Check your birthday suit on your birthday. If you notice anything changing, itching or bleeding on your skin, come see [place] for an appointment. Skin cancer is very treatable when caught early.    How much sunscreen should I use, and how often should I apply it?     Use enough sunscreen to generously coat all skin that will be not be covered by clothing. Ask yourself, "Will my face, ears, arms or hands be covered by clothing?" If not, apply sunscreen. Most people only apply 25-50 percent of the recommended amount of sunscreen.8 Follow the guideline of "1 ounce, enough to fill a shot glass," which dermatologists consider the amount needed to cover the exposed areas of the body. Adjust the amount of sunscreen applied depending on your body size. Apply sunscreen to dry skin 15 minutes BEFORE going outdoors. Skin cancer also can form on the lips. To protect your lips, apply a lip balm or lipstick that contains sunscreen with an SPF of 30 or higher. Reapply sunscreen approximately every two hours, or after swimming or sweating, according to the directions on the bottle.       What does melanoma look like?    Recognition of changes in the skin is the best way to detect early melanoma. They most frequently appear on the upper back, torso, lower legs, head and neck.13, 21 In females 59-1 years old, the torso/trunk is the most common location for developing melanoma, which may be due to high-risk tanning behaviors.13, 21 If you have a changing mole, a new mole or a mole that is different from the rest, make an appointment to see [place].    If you notice a mole on your skin, you should follow the ABCDE rule, which outlines the warning signs of melanoma:  Asymmetry: One half does not match the other half.   Border irregularity: The edges are ragged, notched or blurred.   Color: The pigmentation is not  uniform. Different shades of tan, brown or black are often present. Dashes of red, white, and blue can add to the mottled appearance.   Diameter: While melanomas are usually greater than 9mm in diameter when diagnosed, they  can be smaller.   Evolving: A mole or skin lesion that looks different from the rest or is changing in size, shape or color.    The American Academy of Dermatology urges everyone to examine their skin regularly. This means looking over your entire body, including your back, your scalp, your palms, your soles and between your toes.  If you notice a mole different from others, or one that changes, itches or bleeds, even if it is smaller than 6mm, you should make an appointment to see [place] as soon as possible.       Adapted from the American Academy of Dermatology website: InfoExam.si             Linus Orn, MD  Family Medicine  Fellowship-trained in Dermatology

## 2022-04-13 NOTE — Patient Instructions (Signed)
What sunscreen should I use?     The American Academy of Dermatology recommends everyone use sunscreen that offers the following:    • Broad-spectrum protection (protects against UVA and UVB rays)   • Sun Protection Factor (SPF) 30 or higher   • Water resistance    A sunscreen that offers the above helps to protect your skin from sunburn, early skin aging and skin cancer. However, sunscreen alone cannot fully protect you. In addition to wearing sunscreen, dermatologists recommend taking the following steps to protect your skin and find skin cancer early:    • Seek shade when appropriate, remembering that the sun’s rays are strongest between 10 a.m. and 2 p.m. If your shadow is shorter than you are, seek shade.  • Wear protective clothing, such as a long-sleeved shirt, pants, a wide-brimmed hat and sunglasses, when possible.   • Use extra caution near water, snow and sand as they reflect the damaging rays of the sun, which can increase your chance of sunburn.   • Get vitamin D safely through a healthy diet that may include vitamin supplements. Don’t seek the sun.  • Avoid tanning beds. Ultraviolet light from the sun and tanning beds can cause skin cancer and wrinkling. If you want to look tan, you may wish to use a self-tanning product, but continue to use sunscreen with it.  • Check your birthday suit on your birthday. If you notice anything changing, itching or bleeding on your skin, come see us for an appointment. Skin cancer is very treatable when caught early.    How much sunscreen should I use, and how often should I apply it?     Use enough sunscreen to generously coat all skin that will be not be covered by clothing. Ask yourself, “Will my face, ears, arms or hands be covered by clothing?” If not, apply sunscreen. Most people only apply 25-50 percent of the recommended amount of sunscreen.8 Follow the guideline of “1 ounce, enough to fill a shot glass,” which dermatologists consider the amount needed to cover  the exposed areas of the body. Adjust the amount of sunscreen applied depending on your body size. Apply sunscreen to dry skin 15 minutes BEFORE going outdoors. Skin cancer also can form on the lips. To protect your lips, apply a lip balm or lipstick that contains sunscreen with an SPF of 30 or higher. Reapply sunscreen approximately every two hours, or after swimming or sweating, according to the directions on the bottle.       What does melanoma look like?    Recognition of changes in the skin is the best way to detect early melanoma. They most frequently appear on the upper back, torso, lower legs, head and neck.13, 21 In females 15-29 years old, the torso/trunk is the most common location for developing melanoma, which may be due to high-risk tanning behaviors.13, 21 If you have a changing mole, a new mole or a mole that is different from the rest, make an appointment to see us.    If you notice a mole on your skin, you should follow the ABCDE rule, which outlines the warning signs of melanoma:  • Asymmetry: One half does not match the other half.   • Border irregularity: The edges are ragged, notched or blurred.   • Color: The pigmentation is not uniform. Different shades of tan, brown or black are often present. Dashes of red, white, and blue can add to the mottled appearance.   • Diameter:   While melanomas are usually greater than 6mm in diameter when diagnosed, they can be smaller.   • Evolving: A mole or skin lesion that looks different from the rest or is changing in size, shape or color.    The American Academy of Dermatology urges everyone to examine their skin regularly. This means looking over your entire body, including your back, your scalp, your palms, your soles and between your toes.  If you notice a mole different from others, or one that changes, itches or bleeds, even if it is smaller than 6mm, you should make an appointment to see us as soon as possible.       Adapted from the American Academy  of Dermatology website: www.aad.org

## 2022-04-19 ENCOUNTER — Encounter: Payer: No Typology Code available for payment source | Admitting: Cardiovascular Disease

## 2022-04-20 ENCOUNTER — Ambulatory Visit (RURAL_HEALTH_CENTER): Payer: Self-pay | Admitting: Family Medicine

## 2022-04-26 ENCOUNTER — Ambulatory Visit: Payer: No Typology Code available for payment source

## 2022-04-30 NOTE — Telephone Encounter (Signed)
Updates requested in Care Everywhere. Patient has upcoming appointment 05/18/2022.

## 2022-05-14 ENCOUNTER — Emergency Department
Admission: EM | Admit: 2022-05-14 | Discharge: 2022-05-14 | Disposition: A | Discharge status: Home / Self Care | Payer: No Typology Code available for payment source | Attending: Emergency Medicine | Admitting: Emergency Medicine

## 2022-05-14 DIAGNOSIS — I10 Essential (primary) hypertension: Secondary | ICD-10-CM | POA: Insufficient documentation

## 2022-05-14 DIAGNOSIS — Z760 Encounter for issue of repeat prescription: Secondary | ICD-10-CM | POA: Insufficient documentation

## 2022-05-14 MED ORDER — CLONIDINE HCL 0.1 MG PO TABS
0.1000 mg | ORAL_TABLET | Freq: Three times a day (TID) | ORAL | 0 refills | Status: DC | PRN
Start: 2022-05-14 — End: 2022-05-18

## 2022-05-14 NOTE — ED Notes (Signed)
Pt has requested a refill on her Clonidine; however, I don't see Clonidine on her medication list or outside medication list.

## 2022-05-14 NOTE — ED Provider Notes (Signed)
History     Chief Complaint   Patient presents with    Medication Refill     76 year old female presents requesting refill of her clonidine.  States that her blood pressure has been spiking recently and she is out of the clonidine that she previously used to control this.  No chest pain, difficulty breathing, headaches, visual changes.           Past Medical History:   Diagnosis Date    Primary malignant neoplasm of endometrium        Past Surgical History:   Procedure Laterality Date    CHOLECYSTECTOMY  11/2017    HYSTERECTOMY      TOTAL KNEE ARTHROPLASTY Bilateral 04/2017       Family History   Problem Relation Age of Onset    Heart disease Mother     Cancer Mother     Breast cancer Mother     Heart disease Father     Cancer Maternal Aunt     Breast cancer Maternal Aunt     Heart disease Maternal Grandmother     Diabetes Maternal Grandmother     Heart disease Maternal Grandfather     Heart disease Paternal Grandmother     Heart disease Paternal Grandfather        Social  Social History     Tobacco Use    Smoking status: Never    Smokeless tobacco: Never   Vaping Use    Vaping status: Never Used   Substance Use Topics    Alcohol use: Never    Drug use: Never       .     Allergies   Allergen Reactions    Bee Venom Anaphylaxis    Tobacco Anaphylaxis, Dizziness, Respiratory Distress and Shortness Of Breath    Atorvastatin Muscle Pain and Other (See Comments)     Body pain, loss of sleep  Unknown      Epinephrine     Hydrocodone Nausea Only    Molds & Smuts Other (See Comments) and Nausea And Vomiting     Severe headache and tearing of the eyes  Severe headache and tearing of the eyes      Oxycodone Nausea Only    Powder Scent Fragrance     Tramadol Other (See Comments) and Nausea And Vomiting     Unknown  Unknown      Hydrochlorothiazide Anxiety    Lisinopril Anxiety       Home Medications               amoxicillin (AMOXIL) 500 MG capsule          Ascorbic Acid (vitamin C) 1000 MG tablet     Take 100 Units by  mouth daily     aspirin 81 MG EC tablet     aspirin 81 mg tablet,delayed release     Calcium-Magnesium 300-300 MG Tab          LUTEIN PO     Take 1 capsule by mouth daily     metoprolol succinate XL (TOPROL-XL) 25 MG 24 hr tablet     Take 1 tablet (25 mg) by mouth daily     Misc Natural Products (GLUCOSAMINE CHOND MSM FORMULA PO)     Take 1 tablet by mouth daily     mometasone (ELOCON) 0.1 % ointment     Apply topically daily As directed.     Multiple Vitamin (Thera) Tab     Take 1 tablet  by mouth daily     olmesartan (BENICAR) 40 MG tablet     TAKE 1 TABLET BY MOUTH ONCE DAILY.     Omega-3 Fatty Acids (Omega-3 Fish Oil) 500 MG Cap     Take by mouth     rosuvastatin (CRESTOR) 5 MG tablet     TAKE ONE TABLET BY MOUTH ON MONDAY, WEDNESDAY, AND FRIDAY.     Sodium Hyaluronate, oral, (HYALURONIC ACID PO)               Flagged for Removal               meclizine (ANTIVERT) 25 MG tablet     Take 1 tablet (25 mg) by mouth every 6 (six) hours as needed for Dizziness     Patient not taking: Reported on 04/13/2022             Review of Systems   Respiratory:  Negative for shortness of breath.    Cardiovascular:  Negative for chest pain.   Neurological:  Negative for headaches.   All other systems reviewed and are negative.      Physical Exam    BP: 145/89, Heart Rate: 62, Temp: 98.1 F (36.7 C), Resp Rate: 16, SpO2: 96 %, Weight: 77.1 kg    Physical Exam  Vitals and nursing note reviewed.   Constitutional:       General: She is not in acute distress.     Appearance: Normal appearance. She is not toxic-appearing.   HENT:      Head: Normocephalic.   Eyes:      Conjunctiva/sclera: Conjunctivae normal.   Cardiovascular:      Rate and Rhythm: Normal rate.   Pulmonary:      Effort: Pulmonary effort is normal.   Skin:     General: Skin is warm and dry.   Neurological:      General: No focal deficit present.      Mental Status: She is alert and oriented to person, place, and time.   Psychiatric:         Mood and Affect: Mood normal.            MDM and ED Course     ED Medication Orders (From admission, onward)      None               Medical Decision Making  76 year old female presents requesting refill of clonidine.  On exam here hemodynamically stable and afebrile.  Denies any symptoms of endorgan dysfunction related to hypertension.  Will refill prescription as requested and recommend follow-up with PCP and cardiology.    Problems Addressed:  Medication refill: self-limited or minor problem    Risk  Prescription drug management.                     Procedures    Clinical Impression & Disposition     Clinical Impression  Final diagnoses:   Medication refill        ED Disposition       ED Disposition   Discharge    Condition   --    Date/Time   Mon May 14, 2022 10:53 AM    Comment   Raina Mina discharge to home/self care.    Condition at disposition: Stable                  Discharge Medication List as of 05/14/2022 10:54 AM  START taking these medications    Details   cloNIDine (CATAPRES) 0.1 MG tablet Take 1 tablet (0.1 mg) by mouth 3 (three) times daily as needed (HTN), Starting Mon 05/14/2022, Until Wed 06/13/2022 at 2359, Normal                         Laney Pastor, MD  05/14/22 1606

## 2022-05-18 ENCOUNTER — Encounter: Payer: Self-pay | Admitting: Internal Medicine

## 2022-05-18 ENCOUNTER — Ambulatory Visit: Payer: No Typology Code available for payment source | Admitting: Internal Medicine

## 2022-05-18 VITALS — BP 146/86 | HR 66 | Ht 63.0 in | Wt 166.0 lb

## 2022-05-18 DIAGNOSIS — R42 Dizziness and giddiness: Secondary | ICD-10-CM

## 2022-05-18 DIAGNOSIS — I471 Supraventricular tachycardia: Secondary | ICD-10-CM

## 2022-05-18 DIAGNOSIS — I252 Old myocardial infarction: Secondary | ICD-10-CM

## 2022-05-18 NOTE — Progress Notes (Unsigned)
Cardiology Consult Note      Patient Name: Christine Little Little   Date of Birth: 25-Mar-1946    Provider: Sima Matas, MD     Patient Care Team:  Maylene Roes Hipolito Bayley, MD as PCP - General (Family Medicine)    Chief Complaint: narrow complex tachycardia        History of Present Illness   Christine Little Little is a 76 y.o. female referred for Hx of Tachycardia, HTN, and HLD. She is a retired history professor.  She has been on metoprolol 25 mg daily for many years, and after attempting to increase to 50 mg, had worsening bradycardia.    Echo 03/12/22: EF 60-65%.  LAd 2.3 cm and LAVI 13.4 mL/m2    Stress 03/01/22: I personally reviewed the EKG portion. During stage 1 of Bruce protocol on 03/01/22 had SVT to 202 bpm for about 20 seconds. Intiated  wby likely PAC and ended in QRS. Difficult to see atrial activity during SVT, but potentially short RP tachycardia.    Subj:  She states when her HR drops she just gets very dizzy, tired, and out of breath. She measures her pulse with an at home pulse-ox and her blood pressure cuff. She states she gets dizzy and feels like she is going to pass out frequently, potentially every day or 2 and sometimes multiple times per day. Her resting HR over the last several decades have been in the 60s, but now they are in the 30s and 40s. This is when she feels dizzy and like she is about to pass out. She states that if she is sitting down reading or watching tv, she gets a couple really hard beats followed by a single pause. When this happens she hits herself in the chest to knock in back into regular feeling. She states when she is sleeping she wakes up to her extremities tingling and is able to subconsciously tell herself that something is wrong. She jumps out of bed and gets her heart rate increased to make sure she feels as if everything is normal. She had a sleep study completed but the results have not appeared yet. The morning of April 2nd she states that she woke up and couldn't walk across the room.  She states she felt like she was in an atmosphere as if she was dying. She denies syncope.     Review of Systems   Constitution: Negative for activity change, appetite change, fatigue, fever, and unexpected weight change  HENT: Negative for trouble swallowing, and voice change  Eyes: Negative for any visual disturbances  Respiratory: Negative for apnea and/or awakening with sob, chest tightness, and sob  Cardiovascular: Negative for chest pain/discomfort, leg or ankle swelling, difficulty breathing lying down, and palpitations  Gastrointestinal: Negative for abdominal pain, blood in stool, nausea, and vomiting  Endocrine: Negative for cold intolerance, heat intolerance, and polydipsia  GU: Negative for flank pain, frequent urination, and hematuria  Musculoskeletal: Negative for gait problems, joint swelling, neck pain, and pain or heaviness in legs  Skin: Negative for rash, wound, and ulcers on legs/feet  Neurological: Negative for dizziness, headaches, and syncope  Hematologic: Negative for bruises  Psychiatric: Negative for anxiety, and sleep disturbance    Comprehensive review of systems performed by me. Unless otherwise noted, all systems are negative.    Past Medical History     PMH 05/18/22 TLT/TA  LOV 01/17/22 MDM   Labs 03/08/22     1. 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 This is a low risk study. Overall left ventricular systolic function was abnormal. Nuclear stress EF: 70%.   b) Echo 03/12/22- EF 60-65%. Mild AR. Normal RV and systolic function. No significant stenosis. LV DD 4.1, Ao root 2.7, LA D 2.3, LA V 13.4, Asc Ao 2.9, AV PV 1.79 m/s, AV PG 12.8  c) Stress test 03/12/22- Fair exercise capacity for age and gender (3:08 Bruce protocol, 4.7 METs), which could in part be related to the abrupt onset of a fairly regular narrow complex tachycardia at a rate of approximately 200 bpm late in stage one of the Bruce protocol, with spontaneous resolution after 20  seconds.  Morphology to tachycardia cannot be definitively determined. No inducible angina or electrocardiographic evidence of myocardial ischemia at low workload.  [facility] treadmill score 3, intermediate risk exercise treadmill test. Normal exercise stress myocardial perfusion study at fairly low workload (4.7 METs). Normal left ventricular size and systolic function. EF post stress is approximately 70%. Low to intermediate risk exercise myocardial perfusion study with no electrocardiographic or scintigraphic evidence of myocardial ischemia, albeit at a fairly low achieved workload.     2. Hypertension  3. Hyperlipidemia  4. Malignant neoplasm of endometrium      ED 02/25/22: Hypertension and dizziness. BP upon arrival was 170/110.  ED 03/01/22: Migraine. BP upon arrival was 168/91.    Past Surgical History     Past Surgical History:   Procedure Laterality Date    CHOLECYSTECTOMY  11/2017    HYSTERECTOMY      TOTAL KNEE ARTHROPLASTY Bilateral 04/2017       Family History     Family History   Problem Relation Age of Onset    Heart disease Mother     Cancer Mother     Breast cancer Mother     Heart disease Father     Cancer Maternal Aunt     Breast cancer Maternal Aunt     Heart disease Maternal Grandmother     Diabetes Maternal Grandmother     Heart disease Maternal Grandfather     Heart disease Paternal Grandmother     Heart disease Paternal Grandfather      Her sister has hypertension. Daughter has cardiac issues and she urges her to follow with a diligent cardiologist  Social History     Social History     Tobacco Use    Smoking status: Never    Smokeless tobacco: Never   Vaping Use    Vaping Use: Never used   Substance Use Topics    Alcohol use: Never    Drug use: Never       Allergies     is allergic to bee venom, tobacco, atorvastatin, epinephrine, hydrocodone, molds & smuts, oxycodone, powder scent fragrance, tramadol, hydrochlorothiazide, and lisinopril.    Medications       Current Outpatient Medications:      amoxicillin (AMOXIL) 500 MG capsule, , Disp: , Rfl:     Ascorbic Acid (vitamin C) 1000 MG tablet, Take 100 Units by mouth daily, Disp: , Rfl:     aspirin 81 MG EC tablet, aspirin 81 mg tablet,delayed release, Disp: , Rfl:     Calcium-Magnesium 300-300 MG Tab, , Disp: , Rfl:     LUTEIN PO, Take 1 capsule by mouth daily, Disp: , Rfl:     metoprolol succinate XL (TOPROL-XL) 25 MG 24 hr tablet, Take 1 tablet (25 mg) by mouth daily (Patient taking differently:  Take 1 tablet (25 mg) by mouth daily Takes 25 mg daily), Disp: 90 tablet, Rfl: 1    Misc Natural Products (GLUCOSAMINE CHOND MSM FORMULA PO), Take 1 tablet by mouth daily, Disp: , Rfl:     mometasone (ELOCON) 0.1 % ointment, Apply topically daily As directed., Disp: 45 g, Rfl: 1    Multiple Vitamin (Thera) Tab, Take 1 tablet by mouth daily, Disp: , Rfl:     olmesartan (BENICAR) 40 MG tablet, TAKE 1 TABLET BY MOUTH ONCE DAILY., Disp: 90 tablet, Rfl: 1    Omega-3 Fatty Acids (Omega-3 Fish Oil) 500 MG Cap, Take by mouth, Disp: , Rfl:     rosuvastatin (CRESTOR) 5 MG tablet, TAKE ONE TABLET BY MOUTH ON MONDAY, WEDNESDAY, AND FRIDAY., Disp: 90 tablet, Rfl: 1    Sodium Hyaluronate, oral, (HYALURONIC ACID PO), , Disp: , Rfl:       Physical Exam     Visit Vitals  BP 146/86 (BP Site: Left arm, Patient Position: Sitting)   Pulse 66   Ht 1.6 m (5\' 3" )   Wt 75.3 kg (166 lb)   BMI 29.41 kg/m     Vitals:    05/18/22 1339   BP: 146/86   BP Site: Left arm   Patient Position: Sitting   Pulse: 66   Weight: 75.3 kg (166 lb)   Height: 1.6 m (5\' 3" )     Wt Readings from Last 3 Encounters:   05/18/22 75.3 kg (166 lb)   05/14/22 77.1 kg (170 lb)   03/13/22 78.5 kg (173 lb)        Constitutional -  Well appearing, and in no distress  Eyes - Pupils equal and reactive, extraocular eye movements intact, sclera anicteric  Oropharynx - Moist mucous membranes  Respiratory - Clear to auscultation bilaterally, normal respiratory effort    Cardiovascular system -    Normal rate and regular rhythm     Normal S1, S2    No murmurs, rubs, gallops   Jugular venous pulse is normal        Carotid upstroke normal, no carotid bruits auscultated   2+ pulses in the posterior tibial / dorsalis pedis bilaterally    Neurological - Alert, oriented, no focal neurological deficits  Extremities -  No clubbing or cyanosis. No peripheral edema  Skin - Warm and dry, no rashes  Psych- Appropriate affect    Labs     Lab Results   Component Value Date/Time    WBC 6.6 03/08/2022 10:20 AM    RBC 5.01 (H) 03/08/2022 10:20 AM    HGB 14.9 03/08/2022 10:20 AM    HCT 46.5 03/08/2022 10:20 AM    PLT 207 03/08/2022 10:20 AM    TSH 1.71 03/08/2022 10:20 AM       Lab Results   Component Value Date/Time    NA 139 03/08/2022 10:20 AM    K 4.3 03/08/2022 10:20 AM    CL 106 03/08/2022 10:20 AM    CO2 25 03/08/2022 10:20 AM    GLU 109 (H) 03/08/2022 10:20 AM    BUN 20 03/08/2022 10:20 AM    CREAT 0.87 03/08/2022 10:20 AM    PROT 6.9 03/08/2022 10:20 AM    ALKPHOS 89 03/08/2022 10:20 AM    AST 18 03/08/2022 10:20 AM    ALT 17 03/08/2022 10:20 AM       Lab Results   Component Value Date/Time    CHOL 161 03/08/2022 10:20 AM    TRIG  73 03/08/2022 10:20 AM    HDL 50 03/08/2022 10:20 AM    LDL 96 03/08/2022 10:20 AM       Lab Results   Component Value Date/Time    HGBA1CPERCNT 5.3 03/08/2022 10:20 AM       Cardiogenics:   EKG: Performed in clinic today and interpreted by me showing normal sinus rhythm at a rate of 65 bpm. QRS duration is 97 ms. QT/QTc 428/445.     ECGs independently interpreted by me that have not been interpreted by a clinical cardiac electrophysiologist:  Stress 03/01/22: I personally reviewed the EKG portion. During stage 1 of Bruce protocol on 03/01/22 had SVT to 202 bpm for about 20 seconds. Intiated  wby likely PAC and ended in QRS. Difficult to see atrial activity during SVT, but potentially short RP tachycardia.    Impression and Recommendations:     1. Narrow complex tachycardia  - Referral to Cardiac Electrophysiology    2. History  of MI (myocardial infarction)    3. Dizziness  - Office - Event Monitor - 30 day; Future    She is experiencing several symptoms that she is correlating with bradycardia. We discussed the possibility of adjusting her medications or the placement of a permanent pacemaker based on the results of her monitor.  This is in the background of newly difficult to control blood pressure.  She recognizes multiple times where she feels tired and on pulse oximeter, her heart rate is in the 30s and 40s.  This may indeed reflect bradycardia or rather due to bradycardia from heartbeats not picked up after PACs or PVCs, pseudo bradycardia.  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  Without her concerning history of bradycardia, I would have liked to increase the metoprolol, but I cannot do this.  I would first like to correlate her symptoms with a monitor.  We will order an event monitor to try and correlate bradycardia with symptoms.  If she is having symptoms of bradycardia with sinus bradycardia, would likely then require pacemaker as I do not think stopping her beta-blocker in the setting of SVT would be possible.  We explained the risks, benefits, and alternatives to pacemaker implantation if 1 were to be indicated, and she understands this as her mother had a pacemaker implanted that served her well for over 10 years.   She will continue to follow with Dr. Carolynn Sayers for her blood pressure maintenance. We will follow up in 4 weeks with myself.  Given the asymptomatic nature of the 1 episode of tachycardia that was not sustained, would not recommend EP study at this time.  -Continue Metoprolol 25mg  once daily  -Get event monitor placed   -BP management per Dr. Vergia Alcon    Thank you kindly for referring this patient.     Respectfully,   Sima Matas,  MD  05/20/2022  Electronically signed by:  Sima Matas, MD     This note was scribed by Donnetta Hail. Caralee Ates.

## 2022-05-26 ENCOUNTER — Ambulatory Visit: Payer: No Typology Code available for payment source

## 2022-06-11 ENCOUNTER — Other Ambulatory Visit (RURAL_HEALTH_CENTER): Payer: Self-pay

## 2022-06-11 MED ORDER — METOPROLOL SUCCINATE ER 25 MG PO TB24
25.0000 mg | ORAL_TABLET | Freq: Every day | ORAL | 1 refills | Status: DC
Start: ? — End: 2022-06-11

## 2022-06-19 ENCOUNTER — Ambulatory Visit (RURAL_HEALTH_CENTER): Payer: Self-pay | Admitting: Family Medicine

## 2022-06-19 ENCOUNTER — Ambulatory Visit (RURAL_HEALTH_CENTER): Payer: Self-pay | Admitting: Nurse Practitioner

## 2022-07-13 ENCOUNTER — Telehealth: Payer: Self-pay | Admitting: Internal Medicine

## 2022-07-13 NOTE — Telephone Encounter (Signed)
We reviewed her monitor and I called her on the phone.  She has heart rates that are occasionally in the 40s with symptoms and she also has PACs around the time of symptoms.  She also has symptoms in sinus rhythm with heart rates in the 70s to 90s.  This is overall a bit of a confusing picture, but she has lightheadedness and dizziness with heart rates in the 40s occasionally in the 30s while awake and would likely benefit from a higher dose of metoprolol.  We discussed that she could potentially benefit from a pacemaker and I be willing to do this.  She has another appointment with a cardiologist in the next week or 2 and will call us back.  If she does, we will plan on doing a [employer] dual-chamber left bundle pacemaker.

## 2022-07-18 ENCOUNTER — Ambulatory Visit: Payer: No Typology Code available for payment source | Attending: Family Medicine | Admitting: Family Medicine

## 2022-07-18 ENCOUNTER — Encounter (RURAL_HEALTH_CENTER): Payer: Self-pay | Admitting: Family Medicine

## 2022-07-18 VITALS — BP 134/82 | HR 62 | Temp 97.0°F | Resp 16 | Ht 63.0 in | Wt 165.0 lb

## 2022-07-18 DIAGNOSIS — I10 Essential (primary) hypertension: Secondary | ICD-10-CM

## 2022-07-18 DIAGNOSIS — E785 Hyperlipidemia, unspecified: Secondary | ICD-10-CM

## 2022-07-18 DIAGNOSIS — R7309 Other abnormal glucose: Secondary | ICD-10-CM

## 2022-07-18 DIAGNOSIS — Z8542 Personal history of malignant neoplasm of other parts of uterus: Secondary | ICD-10-CM

## 2022-07-18 MED ORDER — AMOXICILLIN 500 MG PO CAPS
ORAL_CAPSULE | ORAL | 0 refills | Status: DC
Start: ? — End: 2022-07-18

## 2022-07-18 NOTE — Progress Notes (Addendum)
Progress Note      Patient Name:  Christine Little    Subjective/HPI Comments:    Patient is a  76 y.o. female who presents with follow-up and is requesting amoxicillin prior to dental procedure.  I gave the patient a paper from Up To Date, that shows that the American Dental Association and the American Orthopedic Association does not recommend dental prophylaxis to cover previous joint replacements.  She said she would  show that to her dentist.  Chief Complaint   Patient presents with    Follow-up     3 month       Outpatient Medications Marked as Taking for the 07/18/22 encounter (Office Visit) with Averee Harb, Hipolito BayleyWilliam Conrad, MD   Medication Sig Dispense Refill    Ascorbic Acid (vitamin C) 1000 MG tablet Take 100 Units by mouth daily      aspirin 81 MG EC tablet aspirin 81 mg tablet,delayed release      Calcium-Magnesium 300-300 MG Tab       cloNIDine (CATAPRES) 0.1 MG tablet Take 5 tablets (0.5 mg) by mouth      LUTEIN PO Take 1 capsule by mouth daily      Misc Natural Products (GLUCOSAMINE CHOND MSM FORMULA PO) Take 1 tablet by mouth daily      mometasone (ELOCON) 0.1 % ointment Apply topically daily As directed. 45 g 1    Multiple Vitamin (Thera) Tab Take 1 tablet by mouth daily      olmesartan (BENICAR) 40 MG tablet TAKE 1 TABLET BY MOUTH ONCE DAILY. 90 tablet 1    Omega-3 Fatty Acids (Omega-3 Fish Oil) 500 MG Cap Take by mouth      rosuvastatin (CRESTOR) 5 MG tablet TAKE ONE TABLET BY MOUTH ON MONDAY, WEDNESDAY, AND FRIDAY. 90 tablet 1    Sodium Hyaluronate, oral, (HYALURONIC ACID PO)       [DISCONTINUED] amoxicillin (AMOXIL) 500 MG capsule        Allergies   Allergen Reactions    Bee Venom Anaphylaxis    Tobacco Anaphylaxis, Dizziness, Respiratory Distress and Shortness Of Breath    Atorvastatin Muscle Pain and Other (See Comments)     Body pain, loss of sleep  Unknown      Epinephrine     Hydrocodone Nausea Only    Molds & Smuts Other (See Comments) and Nausea And Vomiting     Severe headache and tearing of the  eyes  Severe headache and tearing of the eyes      Oxycodone Nausea Only    Powder Scent Fragrance     Tramadol Other (See Comments) and Nausea And Vomiting     Unknown  Unknown      Hydrochlorothiazide Anxiety    Lisinopril Anxiety     Past Medical History:   Diagnosis Date    Primary malignant neoplasm of endometrium      Past Surgical History:   Procedure Laterality Date    CHOLECYSTECTOMY  11/2017    HYSTERECTOMY      TOTAL KNEE ARTHROPLASTY Bilateral 04/2017     Social History     Occupational History    Occupation: retired   Tobacco Use    Smoking status: Never    Smokeless tobacco: Never   Vaping Use    Vaping Use: Never used   Substance and Sexual Activity    Alcohol use: Never    Drug use: Never    Sexual activity: Not on file     Family History  Problem Relation Age of Onset    Heart disease Mother     Cancer Mother     Breast cancer Mother     Heart disease Father     Cancer Maternal Aunt     Breast cancer Maternal Aunt     Heart disease Maternal Grandmother     Diabetes Maternal Grandmother     Heart disease Maternal Grandfather     Heart disease Paternal Grandmother     Heart disease Paternal Grandfather        Review of Systems:  Review of Systems   Constitutional:  Negative for chills, diaphoresis, fever, malaise/fatigue and weight loss.   HENT:  Negative for congestion, ear discharge, ear pain, sinus pain and sore throat.    Eyes:  Negative for blurred vision, pain, discharge and redness.   Respiratory:  Negative for cough, shortness of breath and wheezing.    Cardiovascular:  Negative for chest pain, palpitations and orthopnea.   Gastrointestinal:  Negative for abdominal pain, diarrhea, nausea and vomiting.   Genitourinary:  Negative for dysuria, frequency and urgency.   Musculoskeletal:  Negative for joint pain and myalgias.   Skin:  Negative for rash.   Neurological:  Negative for dizziness, tingling, weakness and headaches.   Endo/Heme/Allergies:  Negative for environmental allergies and  polydipsia. Does not bruise/bleed easily.   Psychiatric/Behavioral:  Negative for depression and suicidal ideas. The patient is not nervous/anxious and does not have insomnia.        Objective/ Physical Exam:  Vitals:    07/18/22 0922   BP: 134/82   BP Site: Left arm   Patient Position: Sitting   Cuff Size: Large   Pulse: 62   Resp: 16   Temp: 97 F (36.1 C)   TempSrc: Temporal   SpO2: 97%   Weight: 74.8 kg (165 lb)   Height: 1.6 m (5\' 3" )     Body mass index is 29.23 kg/m.  Pain Score: 3-mild pain    Physical Exam  Vitals reviewed.   Constitutional:       General: She is not in acute distress.     Appearance: She is well-developed. She is not diaphoretic.   HENT:      Head: Normocephalic and atraumatic.      Right Ear: External ear normal.      Left Ear: External ear normal.      Nose: Nose normal.   Eyes:      Conjunctiva/sclera: Conjunctivae normal.      Pupils: Pupils are equal, round, and reactive to light.   Neck:      Vascular: No carotid bruit.   Cardiovascular:      Rate and Rhythm: Normal rate and regular rhythm.      Heart sounds: Normal heart sounds. No murmur heard.     No friction rub. No gallop.   Pulmonary:      Effort: Pulmonary effort is normal. No respiratory distress.      Breath sounds: Normal breath sounds. No wheezing or rales.   Abdominal:      General: Bowel sounds are normal. There is no distension.      Palpations: Abdomen is soft.      Tenderness: There is no abdominal tenderness.   Musculoskeletal:         General: Normal range of motion.   Skin:     General: Skin is warm and dry.   Neurological:      Mental Status: She is alert and  oriented to person, place, and time.   Psychiatric:         Behavior: Behavior normal.         Thought Content: Thought content normal.         Judgment: Judgment normal.       Assessment:    1. Primary hypertension    2. History of endometrial cancer    3. Dyslipidemia    4. Elevated glucose        Plan:  Orders Placed This Encounter   Procedures    CBC  and differential     Standing Status:   Future     Standing Expiration Date:   07/19/2023     Order Specific Question:   Release to patient     Answer:   Immediate    Comprehensive metabolic panel     Standing Status:   Future     Standing Expiration Date:   07/19/2023     Order Specific Question:   Has the patient fasted?     Answer:   Yes     Order Specific Question:   Release to patient     Answer:   Immediate    Hemoglobin A1C     Standing Status:   Future     Standing Expiration Date:   07/19/2023     Order Specific Question:   Release to patient     Answer:   Immediate    Lipid panel     Standing Status:   Future     Standing Expiration Date:   07/19/2023     Order Specific Question:   Has the patient fasted?     Answer:   Yes     Order Specific Question:   Release to patient     Answer:   Immediate    Microalbumin / Creatinine Ratio     Standing Status:   Future     Standing Expiration Date:   07/19/2023     Order Specific Question:   Release to patient     Answer:   Immediate    TSH, Abn Reflex to Free T4, Serum     Standing Status:   Future     Standing Expiration Date:   07/19/2023     Order Specific Question:   Release to patient     Answer:   Immediate     1. Primary hypertension  - CBC and differential; Future  - Comprehensive metabolic panel; Future  - Hemoglobin A1C; Future  - Lipid panel; Future  - Microalbumin / Creatinine Ratio; Future  - TSH, Abn Reflex to Free T4, Serum; Future    2. History of endometrial cancer  - CBC and differential; Future  - Comprehensive metabolic panel; Future  - Hemoglobin A1C; Future  - Lipid panel; Future  - Microalbumin / Creatinine Ratio; Future  - TSH, Abn Reflex to Free T4, Serum; Future    3. Dyslipidemia  - CBC and differential; Future  - Comprehensive metabolic panel; Future  - Hemoglobin A1C; Future  - Lipid panel; Future  - Microalbumin / Creatinine Ratio; Future  - TSH, Abn Reflex to Free T4, Serum; Future    4. Elevated glucose  - CBC and differential; Future  -  Comprehensive metabolic panel; Future  - Hemoglobin A1C; Future  - Lipid panel; Future  - Microalbumin / Creatinine Ratio; Future  - TSH, Abn Reflex to Free T4, Serum; Future      The patient has been  informed of all ordered tests and/or consults, if applicable.  All patient concerns and questions have been addressed.      Shanna Cisco, MD  [facility]  [facility] [place]  [phone]  [place] [address]  [phone]    This note was generated by the Epic EMR system/ Dragon speech recognition and may contain inherent errors or omissions not intended by the user. Grammatical errors, random word insertions, deletions, pronoun errors and incomplete sentences are occasional consequences of this technology due to software limitations. Not all errors are caught or corrected. If there are questions or concerns about the content of this note or information contained within the body of this dictation they should be addressed directly with the author for clarification

## 2022-08-06 ENCOUNTER — Other Ambulatory Visit (RURAL_HEALTH_CENTER): Payer: Self-pay | Admitting: Family Medicine

## 2022-08-14 HISTORY — PX: CARDIAC PACEMAKER PLACEMENT: SHX583

## 2022-08-19 ENCOUNTER — Observation Stay
Admission: EM | Admit: 2022-08-19 | Discharge: 2022-08-20 | Disposition: A | Discharge status: Home / Self Care | Payer: No Typology Code available for payment source | Attending: Student in an Organized Health Care Education/Training Program | Admitting: Student in an Organized Health Care Education/Training Program

## 2022-08-19 ENCOUNTER — Emergency Department: Payer: No Typology Code available for payment source

## 2022-08-19 DIAGNOSIS — Z91148 Patient's other noncompliance with medication regimen for other reason: Secondary | ICD-10-CM | POA: Insufficient documentation

## 2022-08-19 DIAGNOSIS — Z9049 Acquired absence of other specified parts of digestive tract: Secondary | ICD-10-CM | POA: Insufficient documentation

## 2022-08-19 DIAGNOSIS — I10 Essential (primary) hypertension: Secondary | ICD-10-CM | POA: Insufficient documentation

## 2022-08-19 DIAGNOSIS — R0609 Other forms of dyspnea: Secondary | ICD-10-CM | POA: Insufficient documentation

## 2022-08-19 DIAGNOSIS — Z7982 Long term (current) use of aspirin: Secondary | ICD-10-CM | POA: Insufficient documentation

## 2022-08-19 DIAGNOSIS — G4733 Obstructive sleep apnea (adult) (pediatric): Secondary | ICD-10-CM | POA: Insufficient documentation

## 2022-08-19 DIAGNOSIS — Z79899 Other long term (current) drug therapy: Secondary | ICD-10-CM | POA: Insufficient documentation

## 2022-08-19 DIAGNOSIS — Z950 Presence of cardiac pacemaker: Secondary | ICD-10-CM

## 2022-08-19 DIAGNOSIS — R55 Syncope and collapse: Principal | ICD-10-CM | POA: Insufficient documentation

## 2022-08-19 DIAGNOSIS — I4949 Other premature depolarization: Secondary | ICD-10-CM | POA: Insufficient documentation

## 2022-08-19 DIAGNOSIS — R001 Bradycardia, unspecified: Secondary | ICD-10-CM | POA: Insufficient documentation

## 2022-08-19 DIAGNOSIS — I4430 Unspecified atrioventricular block: Secondary | ICD-10-CM | POA: Insufficient documentation

## 2022-08-19 DIAGNOSIS — Z96653 Presence of artificial knee joint, bilateral: Secondary | ICD-10-CM | POA: Insufficient documentation

## 2022-08-19 LAB — CBC AND DIFFERENTIAL
Basophils %: 0.4 % (ref 0.0–3.0)
Basophils Absolute: 0 10*3/uL (ref 0.0–0.3)
Eosinophils %: 5.8 % (ref 0.0–7.0)
Eosinophils Absolute: 0.5 10*3/uL (ref 0.0–0.8)
Hematocrit: 48 % (ref 36.0–48.0)
Hemoglobin: 15.4 gm/dL (ref 12.0–16.0)
Lymphocytes Absolute: 2.6 10*3/uL (ref 0.6–5.1)
Lymphocytes: 32.9 % (ref 15.0–46.0)
MCH: 30 pg (ref 28–35)
MCHC: 32 gm/dL (ref 32–36)
MCV: 93 fL (ref 80–100)
MPV: 8.7 fL (ref 6.0–10.0)
Monocytes Absolute: 0.5 10*3/uL (ref 0.1–1.7)
Monocytes: 6.1 % (ref 3.0–15.0)
Neutrophils %: 54.9 % (ref 42.0–78.0)
Neutrophils Absolute: 4.3 10*3/uL (ref 1.7–8.6)
PLT CT: 185 10*3/uL (ref 130–440)
RBC: 5.16 10*6/uL — ABNORMAL HIGH (ref 3.80–5.00)
RDW: 12.6 % (ref 11.0–14.0)
WBC: 7.9 10*3/uL (ref 4.0–11.0)

## 2022-08-19 LAB — VH CULTURE, URINE

## 2022-08-19 LAB — ECG 12-LEAD
P Wave Axis: 0 deg
P-R Interval: 277 ms
Patient Age: 76 years
Q-T Interval(Corrected): 458 ms
Q-T Interval: 410 ms
QRS Axis: -54 deg
QRS Duration: 118 ms
T Axis: 28 years
Ventricular Rate: 75 //min

## 2022-08-19 LAB — VH URINALYSIS WITH MICROSCOPIC AND CULTURE IF INDICATED
Bilirubin, UA: NEGATIVE
Blood, UA: NEGATIVE
Glucose, UA: NEGATIVE mg/dL
Ketones UA: NEGATIVE mg/dL
Leukocyte Esterase, UA: 25 Leu/uL — AB
Nitrite, UA: NEGATIVE
Protein, UR: NEGATIVE mg/dL
Squam Epithel, UA: 16 /lpf — ABNORMAL HIGH (ref 0–2)
Urine Specific Gravity: 1.006
Urobilinogen, UA: NORMAL mg/dL
WBC, UA: <1 /hpf (ref 0–4)
pH, Urine: 6 pH (ref 5.0–8.0)

## 2022-08-19 LAB — COMPREHENSIVE METABOLIC PANEL
ALT: 16 U/L (ref 0–55)
AST (SGOT): 21 U/L (ref 10–42)
Albumin/Globulin Ratio: 1.11 Ratio (ref 0.80–2.00)
Albumin: 4.2 gm/dL (ref 3.5–5.0)
Alkaline Phosphatase: 103 U/L (ref 40–145)
Anion Gap: 12.9 mMol/L (ref 7.0–18.0)
BUN / Creatinine Ratio: 20.2 Ratio (ref 10.0–30.0)
BUN: 17 mg/dL (ref 7–22)
Bilirubin, Total: 0.4 mg/dL (ref 0.1–1.2)
CO2: 25 mMol/L (ref 20–30)
Calcium: 10.4 mg/dL (ref 8.5–10.5)
Chloride: 105 mMol/L (ref 98–110)
Creatinine: 0.84 mg/dL (ref 0.60–1.20)
EGFR: 72 mL/min/{1.73_m2} (ref 60–150)
Globulin: 3.8 gm/dL (ref 2.0–4.0)
Glucose: 106 mg/dL — ABNORMAL HIGH (ref 71–99)
Osmolality Calculated: 280 mOsm/kg (ref 275–300)
Potassium: 3.9 mMol/L (ref 3.5–5.3)
Protein, Total: 8 gm/dL (ref 6.0–8.3)
Sodium: 139 mMol/L (ref 136–147)

## 2022-08-19 LAB — VH URINE DRUG SCREEN - NO CONFIRMATION
Propoxyphene: NEGATIVE
Urine Amphetamine: NEGATIVE
Urine Barbiturates: NEGATIVE
Urine Benzodiazepines: POSITIVE — AB
Urine Buprenorphine: NEGATIVE
Urine Cannabinoids: NEGATIVE
Urine Cocaine: NEGATIVE
Urine Fentanyl Screen: NEGATIVE
Urine Methadone Screen: NEGATIVE
Urine Methamphetamine: NEGATIVE
Urine Opiates: NEGATIVE
Urine Oxycodone: NEGATIVE
Urine Phencyclidine: NEGATIVE
Urine Tricyclics: NEGATIVE

## 2022-08-19 LAB — THYROID STIMULATING HORMONE (TSH), REFLEX ON ABNORMAL TO FREE T4, SERUM: TSH (Reflex Free T4 if Indicated): 2.5 m[IU]/mL (ref 0.40–4.20)

## 2022-08-19 LAB — TROPONIN I
Troponin I: 0.01 ng/mL (ref 0.00–0.02)
Troponin I: 0 ng/mL (ref 0.00–0.02)
Troponin I: 0.01 ng/mL (ref 0.00–0.02)

## 2022-08-19 LAB — LACTIC ACID, PLASMA: Lactic Acid: 1.2 mMol/L (ref 0.5–1.9)

## 2022-08-19 LAB — MAGNESIUM: Magnesium: 2 mg/dL (ref 1.6–2.6)

## 2022-08-19 LAB — PHOSPHORUS: Phosphorus: 3.7 mg/dL (ref 2.3–4.7)

## 2022-08-19 MED ORDER — ACETAMINOPHEN 325 MG PO TABS
650.0000 mg | ORAL_TABLET | Freq: Four times a day (QID) | ORAL | Status: DC | PRN
Start: 2022-08-19 — End: 2022-08-20

## 2022-08-19 MED ORDER — SODIUM CHLORIDE (PF) 0.9 % IJ SOLN
0.4000 mg | INTRAMUSCULAR | Status: DC | PRN
Start: 2022-08-19 — End: 2022-08-20

## 2022-08-19 MED ORDER — ROSUVASTATIN CALCIUM 10 MG PO TABS
5.0000 mg | ORAL_TABLET | Freq: Every evening | ORAL | Status: DC
Start: 2022-08-19 — End: 2022-08-20
  Filled 2022-08-19: qty 1

## 2022-08-19 MED ORDER — METOPROLOL SUCCINATE ER 50 MG PO TB24
50.0000 mg | ORAL_TABLET | Freq: Every day | ORAL | Status: DC
Start: 2022-08-19 — End: 2022-08-20
  Administered 2022-08-19 – 2022-08-20 (×2): 50 mg via ORAL
  Filled 2022-08-19 (×2): qty 1

## 2022-08-19 MED ORDER — ASPIRIN 81 MG PO CHEW
81.0000 mg | CHEWABLE_TABLET | Freq: Every day | ORAL | Status: DC
Start: 2022-08-19 — End: 2022-08-20
  Administered 2022-08-19 – 2022-08-20 (×2): 81 mg via ORAL
  Filled 2022-08-19 (×2): qty 1

## 2022-08-19 MED ORDER — VH SODIUM CHLORIDE 0.9 % IV BOLUS
1000.0000 mL | Freq: Once | INTRAVENOUS | Status: DC
Start: 2022-08-19 — End: 2022-08-20

## 2022-08-19 MED ORDER — CLONIDINE HCL 0.3 MG PO TABS
0.5000 mg | ORAL_TABLET | Freq: Two times a day (BID) | ORAL | Status: DC
Start: 2022-08-19 — End: 2022-08-20
  Filled 2022-08-19 (×3): qty 1

## 2022-08-19 MED ORDER — LABETALOL HCL 5 MG/ML IV SOLN
10.0000 mg | Freq: Once | INTRAVENOUS | Status: AC
Start: 2022-08-19 — End: 2022-08-19
  Administered 2022-08-19: 10 mg via INTRAVENOUS
  Filled 2022-08-19: qty 20

## 2022-08-19 MED ORDER — ENOXAPARIN SODIUM 40 MG/0.4ML IJ SOSY
40.0000 mg | PREFILLED_SYRINGE | INTRAMUSCULAR | Status: DC
Start: 2022-08-19 — End: 2022-08-20
  Administered 2022-08-20: 40 mg via SUBCUTANEOUS
  Filled 2022-08-19 (×2): qty 0.4

## 2022-08-19 MED ORDER — SODIUM CHLORIDE (PF) 0.9 % IJ SOLN
3.0000 mL | Freq: Two times a day (BID) | INTRAMUSCULAR | Status: DC
Start: 2022-08-19 — End: 2022-08-20
  Administered 2022-08-19 – 2022-08-20 (×3): 3 mL via INTRAVENOUS
  Filled 2022-08-19 (×2): qty 10

## 2022-08-19 MED ORDER — IRBESARTAN 150 MG PO TABS
300.0000 mg | ORAL_TABLET | Freq: Every day | ORAL | Status: DC
Start: 2022-08-19 — End: 2022-08-20
  Administered 2022-08-19 – 2022-08-20 (×2): 300 mg via ORAL
  Filled 2022-08-19 (×2): qty 2

## 2022-08-19 NOTE — ED Notes (Signed)
Patient ready for transfer

## 2022-08-19 NOTE — H&P (Addendum)
Medicine History & Physical   [facility]  [employer]   Patient Name: Christine Little,Christine Little LOS: 0 days   Attending Physician: Wylie Hail, MD PCP: Shanna Cisco, MD      Assessment and Plan:                                                                Hypertension  SBP on arrival > 200 - secondary to non compliance   - home regimen - unclear what patient takes. She said she was told to stop taking metoprolol and takes clonidine prn. Takes benicar as prescribed.- Metoprolol 50xl prescribed 9/11 - pre records   - Given labetalol 10mg  IV in the ED  - Troponin flat - no chest pain  -- Will continue Metoprolol xl 50mg , and clonidine 0.5mg  BID and  avapro   - Sees cardiologist outpatient     Syncope   - Patient said she had three episode of syncope. When she describes the event she describes  Waking up from sleep with a panicked feeling - hands felt heavy and palpitations   - In the ED her pacemaker was interrogated - results normal  - HD stable   - UDS positive for benzodiazepines - not prescribed  - Electrolytes wnl  - ECG ordered  - check Orthostatic vitals  - Check TSH   - Continuous cardia monitoring   - work with PT/OT  - Admitted for observation     OSA   - Patient states she completed a sleep study at [facility] [place] [facility] and believes she needs home oxygen.    - Incomplete pulse ox study in chart    DVT PPx: Medication VTE Prophylaxis Orders: enoxaparin (LOVENOX) syringe 40 mg  Mechanical VTE Prophylaxis Orders: Mechanical VTE: Pneumatic Compression; Knee high  Dispo: Observation  Healthcare Proxy: Daughter  Code: full code     History of Presenting Illness                                  CC: Syncope    Christine Little is a 76 y.o. female patient PMH includes Tachycardia s/p pacer placement, HTN, and HLD.    Patient presents with complaints of three episodes of syncope - described as experiencing auras and feeling like she cant breath or as though her limbs are heavy. The  last episode happened while she was a sleep she said she got out of bed and took deep breath then drove herself to the hospital. The symptoms haven't happened since she got to the hospital. She also complains of elevated BP readings at home. She said her metoprolol was recently discontinued and she's been trying to wean herself off her clonidine which on average she take three times a day. She said she does not tolerate a lot of her Blood pressure medications.    She denies smoking/drinking   Past Medical History:   Diagnosis Date    Primary malignant neoplasm of endometrium      Past Surgical History:   Procedure Laterality Date    CHOLECYSTECTOMY  11/2017    HYSTERECTOMY      TOTAL KNEE ARTHROPLASTY Bilateral 04/2017     Family History  Problem Relation Age of Onset    Heart disease Mother     Cancer Mother     Breast cancer Mother     Heart disease Father     Cancer Maternal Aunt     Breast cancer Maternal Aunt     Heart disease Maternal Grandmother     Diabetes Maternal Grandmother     Heart disease Maternal Grandfather     Heart disease Paternal Grandmother     Heart disease Paternal Grandfather      Social History     Tobacco Use    Smoking status: Never    Smokeless tobacco: Never   Vaping Use    Vaping Use: Never used   Substance Use Topics    Alcohol use: Never    Drug use: Never            Subjective     Review of Systems:  Review of systems as noted in HPI. Full 12 point ROS otherwise negative.         Objective   Physical Exam:     Vitals: T:97.9 F (36.6 C) (Oral),  BP:172/89, HR:70, RR:22, SaO2:96%    1) General Appearance: Alert and oriented x 4. In no acute distress.   2) Eyes: Pink conjunctiva, anicteric sclera. Pupils are equally reactive to light.  3) ENT: Oral mucosa moist with no pharyngeal congestion, erythema or swelling.  4) Neck: Supple, with full range of motion. Trachea is central, no JVD noted  5) Chest: Clear to auscultation bilaterally, no wheezes or rhonchi.  6) CVS: normal rate and  regular rhythm, with no murmurs.  7) Abdomen: Soft, non-tender, no palpable mass. Bowel sounds normal. No CVA tenderness  8) Extremities: No pitting edema, pulses palpable, no calf swelling and no gross deformity.  9) Skin: Warm, dry with normal skin turgor, no rash  10) Lymphatics: No lymphadenopathy in axillary, cervical and inguinal area.   11) Neurological: Cranial nerves II-XII intact. No gross focal motor or sensory deficits noted.  12) Psychiatric: Affect is appropriate. No hallucinations.    EKG: Per my review shows atrial paced  Chest Xray: Per my review shows no acute abnormalities     Patient Vitals for the past 12 hrs:   BP Temp Pulse Resp   08/19/22 1234 172/89 -- 70 --   08/19/22 1058 146/81 -- 63 --   08/19/22 0818 157/80 -- 61 --   08/19/22 0803 169/76 -- 60 --   08/19/22 0747 186/83 -- 60 --   08/19/22 0731 186/90 -- 64 --   08/19/22 0716 196/90 -- 60 --   08/19/22 0704 (!) 208/94 -- 62 --   08/19/22 0658 (!) 205/88 -- 66 --   08/19/22 0654 -- -- 65 --   08/19/22 0624 -- -- 60 --   08/19/22 0603 178/81 -- 63 --   08/19/22 0546 (!) 176/91 -- 63 --   08/19/22 0535 -- -- 64 22   08/19/22 0516 164/81 -- 60 16   08/19/22 0501 171/85 -- 60 12   08/19/22 0457 188/87 -- 61 15   08/19/22 0403 157/90 -- 60 20   08/19/22 0351 -- -- 61 20   08/19/22 0346 (!) 155/91 -- 65 19   08/19/22 0344 (!) 187/95 -- 63 19   08/19/22 0341 197/86 -- 68 17   08/19/22 0331 -- -- 60 18   08/19/22 0321 -- -- 60 17   08/19/22 0319 (!) 203/87 -- 60 19   08/19/22  0315 -- -- 60 13   08/19/22 0301 (!) 198/100 97.9 F (36.6 C) 71 20         02/25/2022     7:04 AM 03/01/2022    11:46 AM 03/13/2022    11:37 AM 05/14/2022    10:05 AM 05/18/2022     1:39 PM 07/18/2022     9:22 AM 08/19/2022     3:01 AM   Weight Monitoring   Height 160 cm 160 cm 160 cm 160 cm 160 cm 160 cm 160 cm   Height Method    Stated   Stated   Weight 79.833 kg 79.379 kg 78.472 kg 77.111 kg 75.297 kg 74.844 kg 76.34 kg   Weight Method    Stated   Bed Scale   BMI (calculated)  31.2 kg/m2 31.1 kg/m2 30.7 kg/m2 30.2 kg/m2 29.5 kg/m2 29.3 kg/m2 29.9 kg/m2         LABS:  Estimated Creatinine Clearance: 54.9 mL/min (based on SCr of 0.84 mg/dL).   Recent Results (from the past 24 hour(s))   ECG 12 lead    Collection Time: 08/19/22  3:02 AM   Result Value Ref Range    Patient Age 76 years    Patient DOB 1946/04/21     Patient Height      Patient Weight      Interpretation Text       Atrial-paced rhythm  Electronically Signed On 08-19-2022 04:02:06 EDT by Blima Dessert      Physician Interpreter [facility]     Ventricular Rate 75 //min    QRS Duration 118 ms    P-R Interval 277 ms    Q-T Interval 410 ms    Q-T Interval(Corrected) 458 ms    P Wave Axis 0 deg    QRS Axis -54 deg    T Axis 28 years   Troponin I    Collection Time: 08/19/22  3:10 AM   Result Value Ref Range    Troponin I 0.01 0.00 - 0.02 ng/mL   CBC and differential    Collection Time: 08/19/22  3:11 AM   Result Value Ref Range    WBC 7.9 4.0 - 11.0 K/cmm    RBC 5.16 (H) 3.80 - 5.00 M/cmm    Hemoglobin 15.4 12.0 - 16.0 gm/dL    Hematocrit 16.1 09.6 - 48.0 %    MCV 93 80 - 100 fL    MCH 30 28 - 35 pg    MCHC 32 32 - 36 gm/dL    RDW 04.5 40.9 - 81.1 %    PLT CT 185 130 - 440 K/cmm    MPV 8.7 6.0 - 10.0 fL    Neutrophils % 54.9 42.0 - 78.0 %    Lymphocytes 32.9 15.0 - 46.0 %    Monocytes 6.1 3.0 - 15.0 %    Eosinophils % 5.8 0.0 - 7.0 %    Basophils % 0.4 0.0 - 3.0 %    Neutrophils Absolute 4.3 1.7 - 8.6 K/cmm    Lymphocytes Absolute 2.6 0.6 - 5.1 K/cmm    Monocytes Absolute 0.5 0.1 - 1.7 K/cmm    Eosinophils Absolute 0.5 0.0 - 0.8 K/cmm    Basophils Absolute 0.0 0.0 - 0.3 K/cmm   Comprehensive metabolic panel    Collection Time: 08/19/22  3:11 AM   Result Value Ref Range    Sodium 139 136 - 147 mMol/L    Potassium 3.9 3.5 - 5.3 mMol/L    Chloride  105 98 - 110 mMol/L    CO2 25 20 - 30 mMol/L    Calcium 10.4 8.5 - 10.5 mg/dL    Glucose 742 (H) 71 - 99 mg/dL    Creatinine 5.95 6.38 - 1.20 mg/dL    BUN 17 7 - 22 mg/dL    Protein,  Total 8.0 6.0 - 8.3 gm/dL    Albumin 4.2 3.5 - 5.0 gm/dL    Alkaline Phosphatase 103 40 - 145 U/L    ALT 16 0 - 55 U/L    AST (SGOT) 21 10 - 42 U/L    Bilirubin, Total 0.4 0.1 - 1.2 mg/dL    Albumin/Globulin Ratio 1.11 0.80 - 2.00 Ratio    Anion Gap 12.9 7.0 - 18.0 mMol/L    BUN / Creatinine Ratio 20.2 10.0 - 30.0 Ratio    EGFR 72 60 - 150 mL/min/1.56m2    Osmolality Calculated 280 275 - 300 mOsm/kg    Globulin 3.8 2.0 - 4.0 gm/dL   Urinalysis w Microscopic and Culture if Indicated    Collection Time: 08/19/22  3:37 AM    Specimen: Urine, Random   Result Value Ref Range    Color, UA Colorless Colorless,Yellow,Light-Yellow    Clarity, UA Clear Clear    Urine Specific Gravity 1.006     pH, Urine 6.0 5.0 - 8.0 pH    Protein, UR Negative Negative mg/dL    Glucose, UA Negative Negative mg/dL    Ketones UA Negative Negative mg/dL    Bilirubin, UA Negative Negative    Blood, UA Negative Negative    Nitrite, UA Negative Negative    Urobilinogen, UA Normal Normal mg/dL    Leukocyte Esterase, UA 25 (A) Negative Leu/uL    UR Micro Performed     WBC, UA <1 0 - 4 /hpf    Squam Epithel, UA 16 (H) 0 - 2 /lpf   Urine Drug Screen - No Confirmation    Collection Time: 08/19/22  3:37 AM   Result Value Ref Range    Urine Cannabinoids Negative Negative    Urine Phencyclidine Negative Negative    Urine Cocaine Negative Negative    Urine Methamphetamine Negative Negative    Urine Opiates Negative Negative    Urine Amphetamine Negative Negative    Urine Benzodiazepines Presumptive Positive (A) Negative    Urine Tricyclics Negative Negative    Urine Methadone Screen Negative Negative    Urine Barbiturates Negative Negative    Urine Oxycodone Negative Negative    Propoxyphene Negative Negative    Urine Buprenorphine Negative Negative    Urine Fentanyl Screen Negative Negative   Troponin I    Collection Time: 08/19/22  6:00 AM   Result Value Ref Range    Troponin I 0.00 0.00 - 0.02 ng/mL   Magnesium    Collection Time: 08/19/22  6:00 AM    Result Value Ref Range    Magnesium 2.0 1.6 - 2.6 mg/dL   Phosphorus    Collection Time: 08/19/22  6:00 AM   Result Value Ref Range    Phosphorus 3.7 2.3 - 4.7 mg/dL   TSH, Abn Reflex to Free T4, Serum    Collection Time: 08/19/22  6:00 AM   Result Value Ref Range    TSH (Reflex Free T4 if Indicated) 2.50 0.40 - 4.20 mIU/mL   Troponin I    Collection Time: 08/19/22 10:11 AM   Result Value Ref Range    Troponin I 0.01 0.00 - 0.02 ng/mL  Lactic Acid    Collection Time: 08/19/22 10:11 AM   Result Value Ref Range    Lactic Acid 1.2 0.5 - 1.9 mMol/L          Allergies   Allergen Reactions    Bee Venom Anaphylaxis    Tobacco Anaphylaxis, Dizziness, Respiratory Distress and Shortness Of Breath    Atorvastatin Muscle Pain and Other (See Comments)     Body pain, loss of sleep  Unknown      Coreg [Carvedilol] Muscle Pain     Muscle cramps all over and then could not breathe.     Doxazosin Dizziness     Feel bad for 6 months could not walk across the room    Epinephrine     Hydralazine Dizziness     Vision issues     Hydrocodone Nausea Only    Molds & Smuts Other (See Comments) and Nausea And Vomiting     Severe headache and tearing of the eyes  Severe headache and tearing of the eyes      Norvasc [Amlodipine]      Makes her feel knocked out     Oxycodone Nausea Only    Powder Scent Fragrance     Tramadol Other (See Comments) and Nausea And Vomiting     Unknown  Unknown      Hydrochlorothiazide Anxiety    Lisinopril Anxiety      XR Chest AP Portable    Result Date: 08/19/2022  No acute findings. ReadingStation:[email or web address]    Home Medications               Ascorbic Acid (vitamin C) 1000 MG tablet     Take 100 Units by mouth daily     aspirin 81 MG EC tablet     aspirin 81 mg tablet,delayed release     Calcium-Magnesium 300-300 MG Tab          cloNIDine (CATAPRES) 0.1 MG tablet     Take 5 tablets (0.5 mg) by mouth     LUTEIN PO     Take 1 capsule by mouth daily     metoprolol succinate XL (TOPROL-XL) 50 MG 24 hr tablet      TAKE 1 TABLET (50 MG) BY MOUTH DAILY     Misc Natural Products (GLUCOSAMINE CHOND MSM FORMULA PO)     Take 1 tablet by mouth daily     mometasone (ELOCON) 0.1 % ointment     Apply topically daily As directed.     Multiple Vitamin (Thera) Tab     Take 1 tablet by mouth daily     olmesartan (BENICAR) 40 MG tablet     TAKE 1 TABLET BY MOUTH ONCE DAILY.     Omega-3 Fatty Acids (Omega-3 Fish Oil) 500 MG Cap     Take by mouth     rosuvastatin (CRESTOR) 5 MG tablet     TAKE ONE TABLET BY MOUTH ON MONDAY, WEDNESDAY, AND FRIDAY.     Sodium Hyaluronate, oral, (HYALURONIC ACID PO)                Meds given in the ED:  Medications   sodium chloride 0.9 % bolus 1,000 mL (1,000 mLs Intravenous Not Given 08/19/22 0404)   sodium chloride (PF) 0.9 % injection 3 mL (3 mLs Intravenous Given 08/19/22 0813)   naloxone [facility]) injection 0.4 mg (has no administration in time range)   enoxaparin (LOVENOX) syringe 40 mg (40 mg Subcutaneous Not Given 08/19/22 0808)  aspirin chewable tablet 81 mg (81 mg Oral Given 08/19/22 1236)   cloNIDine (CATAPRES) tablet 0.5 mg (0.5 mg Oral Not Given 08/19/22 1239)   metoprolol succinate XL (TOPROL-XL) 24 hr tablet 50 mg (50 mg Oral Given 08/19/22 1236)   irbesartan (AVAPRO) tablet 300 mg (300 mg Oral Given 08/19/22 1236)   rosuvastatin (CRESTOR) tablet 5 mg (has no administration in time range)   labetalol (NORMODYNE) injection 10 mg (10 mg Intravenous Given 08/19/22 0808)      Time Spent:     Wylie Hail, MD     08/19/22,2:37 PM   MRN: [id number]                                      CSN: [id number] DOB: 03-Jan-1946

## 2022-08-19 NOTE — Plan of Care (Signed)
Problem: Hemodynamic Status: Cardiac  Goal: Stable vital signs and fluid balance  Description: Interventions:  1. Monitor/assess vital signs and telemetry per unit protocol  2. Weigh on admission and record weight daily  3. Assess signs and symptoms associated with cardiac rhythm changes  4. Monitor intake/output per unit protocol and/or LIP order  5. Monitor lab values  6. Monitor for leg swelling/edema and report to LIP if abnormal  Outcome: Progressing     Problem: Inadequate Tissue Perfusion  Goal: Adequate tissue perfusion will be maintained  Description: Interventions:  1. Monitor/assess vital signs  2. Monitor/assess lab values and report abnormal values  3. Monitor/assess neurovascular status (pulses, capillary refill, pain, paresthesia, paralysis, presence of edema)  4. Monitor intake and output  5. Monitor/assess for signs of VTE (edema of calf/thigh redness, pain)  6. Monitor for signs and symptoms of a pulmonary embolism (dyspnea, tachypnea, tachycardia, confusion)  7. VTE Prevention: Administer anticoagulant(s) and/or apply anti-embolism stockings/devices as ordered  8. Encourage/assist patient as needed to turn, cough, and perform deep breathing every 2 hours  9. Reinforce use of ordered respiratory interventions (i.e. CPAP, BiPAP, Incentive Spirometer, Acapella, etc.)  10. Perform active/passive ROM  11. Reinforce ankle pump exercises  12. Increase mobility as tolerated/progressive mobility  13. Elevate feet  14. Position patient for maximum circulation/cardiac output  15. Place shoes or other foot protection on patient  16. Assess and monitor skin integrity  17. Provide wound/skin care  Outcome: Progressing     Problem: Ineffective Gas Exchange  Goal: Effective breathing pattern  Description: Interventions:  1. Maintain O2 saturation level per LIP order  2. Maintain CO2 level per LIP order  3. Monitor end tidal CO2 level per LIP order  4. Teach/reinforce use of ordered respiratory interventions  (ie. CPAP, BiPAP, Incentive Spirometer, Acapella)  5. Monitor for sleep apnea  6. Monitor for medication induced respiratory depression  Outcome: Progressing     Problem: Nutrition  Goal: Nutritional intake is adequate  Description: Interventions:  1. Monitor daily weights  2. Assist patient with meals/food selection  3. Allow adequate time for meals  4. Encourage/perform oral hygiene as appropriate   5. Encourage/administer dietary supplements as ordered (i.e. tube feed, TPN, oral, OGT/NGT, supplements)  6. Consult/collaborate with Clinical Nutritionist  7. Include patient/patient care companion in decisions related to nutrition  8. Assess anorexia, appetite, and amount of meal/food tolerated  9. Consult/collaborate with Speech Therapy (swallow evaluations)  Outcome: Progressing     Problem: Impaired Mobility  Goal: Mobility/Activity is maintained at optimal level for patient  Description: Interventions:  1. Increase mobility as tolerated/progressive mobility  2. Encourage independent activity per ability  3. Maintain proper body alignment  4. Perform active/passive ROM  5. Plan activities to conserve energy, plan rest periods  6. Reposition patient every 2 hours and as needed unless able to reposition self  7. Assess for changes in respiratory status, level of consciousness and/or development of fatigue  8. Consult/collaborate with Physical Therapy and/or Occupational Therapy  Outcome: Progressing     Problem: Moderate/High Fall Risk Score >5  Description: Fall Risk Score > 5  Goal: Patient will remain free of falls  Outcome: Progressing

## 2022-08-19 NOTE — Plan of Care (Signed)
Problem: Hemodynamic Status: Cardiac  Goal: Stable vital signs and fluid balance  Outcome: Progressing  Flowsheets (Taken 08/19/2022 1303)  Stable vital signs and fluid balance:   Monitor/assess vital signs and telemetry per unit protocol   Weigh on admission and record weight daily   Assess signs and symptoms associated with cardiac rhythm changes   Monitor intake/output per unit protocol and/or LIP order   Monitor lab values   Monitor for leg swelling/edema and report to LIP if abnormal     Problem: Inadequate Tissue Perfusion  Goal: Adequate tissue perfusion will be maintained  Outcome: Progressing  Flowsheets (Taken 08/19/2022 1303)  Adequate tissue perfusion will be maintained:   Monitor/assess vital signs   Monitor/assess lab values and report abnormal values   Monitor/assess neurovascular status (pulses, capillary refill, pain, paresthesia, paralysis, presence of edema)   Monitor intake and output   Monitor/assess for signs of VTE (edema of calf/thigh redness, pain)   Monitor for signs and symptoms of a pulmonary embolism (dyspnea, tachypnea, tachycardia, confusion)   Encourage/assist patient as needed to turn, cough, and perform deep breathing every 2 hours   VTE Prevention: Administer anticoagulant(s) and/or apply anti-embolism stockings/devices as ordered   Reinforce use of ordered respiratory interventions (i.e. CPAP, BiPAP, Incentive Spirometer, Acapella, etc.)   Perform active/passive ROM   Elevate feet   Position patient for maximum circulation/cardiac output   Place shoes or other foot protection on patient   Assess and monitor skin integrity     Problem: Ineffective Gas Exchange  Goal: Effective breathing pattern  Outcome: Progressing  Flowsheets (Taken 08/19/2022 1303)  Effective breathing pattern:   Maintain O2 saturation level per LIP order   Maintain CO2 level per LIP order   Monitor end tidal CO2 level per LIP order   Monitor for medication induced respiratory depression   Teach/reinforce use of  ordered respiratory interventions (ie. CPAP, BiPAP, Incentive Spirometer, Acapella)     Problem: Nutrition  Goal: Nutritional intake is adequate  Outcome: Progressing  Flowsheets (Taken 08/19/2022 1303)  Nutritional intake is adequate:   Monitor daily weights   Assist patient with meals/food selection   Allow adequate time for meals   Encourage/perform oral hygiene as appropriate   Encourage/administer dietary supplements as ordered (i.e. tube feed, TPN, oral, OGT/NGT, supplements)   Include patient/patient care companion in decisions related to nutrition   Assess anorexia, appetite, and amount of meal/food tolerated     Problem: Impaired Mobility  Goal: Mobility/Activity is maintained at optimal level for patient  Outcome: Progressing  Flowsheets (Taken 08/19/2022 1303)  Mobility/activity is maintained at optimal level for patient:   Encourage independent activity per ability   Maintain proper body alignment   Plan activities to conserve energy, plan rest periods   Reposition patient every 2 hours and as needed unless able to reposition self   Assess for changes in respiratory status, level of consciousness and/or development of fatigue

## 2022-08-19 NOTE — ED Notes (Signed)
Report given.

## 2022-08-19 NOTE — ED Provider Notes (Signed)
[facility]  Emergency Department       Patient Name: Christine Little, Christine Little Patient DOB:  07-25-1946   Encounter Date:  08/19/2022 Age: 76 y.o. female   Attending ED Physician: Blima Dessert, MD MRN:  [id number]   Room:  ED20/ED20-A PCP: Shanna Cisco, MD      Diagnosis / Disposition:   Final Impression  1. Syncope, unspecified syncope type    2. Hypertension, unspecified type    3. Status cardiac pacemaker        Disposition              ED Disposition       ED Disposition   VH Admit to Medical/Surgical    Condition   Stable    Date/Time   Sun Aug 19, 2022  7:21 AM    Comment   DR. On[place]wai. Stable.                Follow up  No follow-up provider specified.    Prescriptions  New Prescriptions    No medications on file         History of Presenting Illness:   Chief complaint: Hypertension and Syncope      VWU:JWJX is a 76 year old female patient known to have history of bradycardia status post dual-chamber pacemaker (done at [facility] last September 19), dyslipidemia, hypertension, who is presenting today by private vehicle for syncopal episodes.  Patient states that over the last few weeks she has been having uncontrolled high blood pressure.  Patient states that yesterday in the morning her blood pressure was elevated.  She states that she took half tablet of 0.1 mg clonidine at 7 AM in the morning, and another half tablet at 4 PM.  Patient states that at night she checked her blood pressure and it was still elevated in 200s, so she took another half tablet.  Patient states that she is here today because of 3 syncopal episodes.  Patient states that while she is sitting she lost consciousness around 3 times.  She states that then she wakes up as if she was sleeping. Patient states that she is fine when she is walking.  However, when she sits down she feels as if " I am drifting, my heart and my breathing stops".    She is not  sure about the durations of those episodes.  She does not know why.  Patient  states that just prior to coming to the ED she checked her blood pressure again and it was elevated, so she gave herself another half tablet of clonidine.  Patient denies any chest pain or shortness of breath, no dizziness, no fatigue, no abdominal pain, nausea or vomiting, no flank pain, no other symptoms.  Patient states that she thinks that the pacemaker is functioning properly because her heart rate is in 60s.  Patient has a heart rate of 60 in the ED.  Blood pressure 203/87.   To note, patient denies any Fall or head injury.         No notes on file    In addition to the above history, please see nursing notes. Allergies, meds, past medical, family, social hx, and the results of the diagnostic studies performed have been reviewed by myself.      Review of Systems   Review of Systems   Constitutional: Negative.    HENT: Negative.     Respiratory: Negative.     Cardiovascular: Negative.    Gastrointestinal: Negative.  Genitourinary: Negative.    Musculoskeletal: Negative.    Neurological:  Positive for syncope.   All other systems reviewed and are negative.      All other systems reviewed and negative except as above, pertinent findings in HPI.        Allergies / Medications:   Pt is allergic to bee venom, tobacco, atorvastatin, epinephrine, hydrocodone, molds & smuts, oxycodone, powder scent fragrance, tramadol, hydrochlorothiazide, and lisinopril.    Current/Home Medications    ASCORBIC ACID (VITAMIN C) 1000 MG TABLET    Take 100 Units by mouth daily    ASPIRIN 81 MG EC TABLET    aspirin 81 mg tablet,delayed release    CALCIUM-MAGNESIUM 300-300 MG TAB        CLONIDINE (CATAPRES) 0.1 MG TABLET    Take 5 tablets (0.5 mg) by mouth    LUTEIN PO    Take 1 capsule by mouth daily    METOPROLOL SUCCINATE XL (TOPROL-XL) 50 MG 24 HR TABLET    TAKE 1 TABLET (50 MG) BY MOUTH DAILY    MISC NATURAL PRODUCTS (GLUCOSAMINE CHOND MSM FORMULA PO)    Take 1 tablet by mouth daily    MOMETASONE (ELOCON) 0.1 % OINTMENT    Apply  topically daily As directed.    MULTIPLE VITAMIN (THERA) TAB    Take 1 tablet by mouth daily    OLMESARTAN (BENICAR) 40 MG TABLET    TAKE 1 TABLET BY MOUTH ONCE DAILY.    OMEGA-3 FATTY ACIDS (OMEGA-3 FISH OIL) 500 MG CAP    Take by mouth    ROSUVASTATIN (CRESTOR) 5 MG TABLET    TAKE ONE TABLET BY MOUTH ON MONDAY, WEDNESDAY, AND FRIDAY.    SODIUM HYALURONATE, ORAL, (HYALURONIC ACID PO)             Past History:   Medical: Pt has a past medical history of Primary malignant neoplasm of endometrium.    Surgical: Pt  has a past surgical history that includes Hysterectomy; Total knee arthroplasty (Bilateral, 04/2017); and Cholecystectomy (11/2017).    Family: The family history includes Breast cancer in her maternal aunt and mother; Cancer in her maternal aunt and mother; Diabetes in her maternal grandmother; Heart disease in her father, maternal grandfather, maternal grandmother, mother, paternal grandfather, and paternal grandmother.    Social: Pt reports that she has never smoked. She has never used smokeless tobacco. She reports that she does not drink alcohol and does not use drugs.      Physical Exam:   Physical Exam  Vitals and nursing note reviewed.   Constitutional:       General: She is not in acute distress.     Appearance: Normal appearance. She is not ill-appearing, toxic-appearing or diaphoretic.   HENT:      Head: Normocephalic and atraumatic.      Nose: Nose normal.      Mouth/Throat:      Mouth: Mucous membranes are moist.   Eyes:      Extraocular Movements: Extraocular movements intact.   Cardiovascular:      Rate and Rhythm: Normal rate and regular rhythm.      Pulses: Normal pulses.      Heart sounds: Normal heart sounds.   Pulmonary:      Effort: Pulmonary effort is normal.      Breath sounds: Normal breath sounds.   Chest:       Abdominal:      General: Abdomen is flat.  Hernia: No hernia is present.   Musculoskeletal:         General: Normal range of motion.      Cervical back: Normal range of  motion.   Skin:     Capillary Refill: Capillary refill takes less than 2 seconds.   Neurological:      General: No focal deficit present.      Mental Status: She is alert and oriented to person, place, and time.   Psychiatric:         Mood and Affect: Mood normal.         Behavior: Behavior normal.             MDM:   Medical Decision Making  This is 76 year old female patient status post pacemaker placement who is presenting today with syncopal episode.    Differential diagnosis includes but not limited to: Pacemaker malfunction, arrhythmias, acute cardiac ischemia, electrolyte imbalances, orthostatic hypotension, seizure disorder, vasovagal syncope, hypertension emergency, were all considered.    Patient is well-appearing in the ED.  She does not look sick or distressed.  She has elevated blood pressure.  She has no sign of neurologic deficits.  EKG is paced rhythm.  Labs are not concerning.  Patient has benzos in urine.  She has orthostatic hypotension.  I ordered IV fluids, patient declined.    [employer] representative was contacted.  Pacemaker interrogated.  No concerns.  Her syncope could be vasovagal, orthostatic hypotension versus other reasons.  The plan is to admit the patient to our hospital for further observation.    Problems Addressed:  Hypertension, unspecified type: chronic illness or injury with exacerbation, progression, or side effects of treatment  Syncope, unspecified syncope type: acute illness or injury that poses a threat to life or bodily functions    Amount and/or Complexity of Data Reviewed  Labs: ordered. Decision-making details documented in ED Course.     Details: Labs are nonconcerning.  Patient has benzos in urine.  Radiology: ordered and independent interpretation performed. Decision-making details documented in ED Course.     Details: I interpreted chest x-ray by myself, no obvious pneumonia or pneumothorax.  ECG/medicine tests: ordered and independent interpretation performed.  Decision-making details documented in ED Course.     Details: I interpreted EKG by myself, arterial paced rhythm.   Discussion of management or test interpretation with external provider(s): I discussed this case with Dr. Minus Liberty from hospital medicine.  He recommended transferring the patient to [facility].  This case was discussed with [facility]. Dr. Merrilyn Puma.  Wants pacemaker interrogated and call back with results.    I discussed this case with Dr. Deno Etienne, Shelda Altes, MD, cardiology at [facility].  He recommended calling the [employer] first.     Spoke to [place] from [employer].  He interrogated the pacemaker.    I spoke with Dr. Macon Large from internal medicine.  I recommended admission.  She kindly accepted the patient.    Risk  Decision regarding hospitalization.       7:27 AM      Course in ED:     7:27 AM      Diagnostic Results:   The results of the diagnostic studies have been reviewed by myself:    Radiologic Studies  XR Chest AP Portable    Result Date: 08/19/2022  No acute findings. ReadingStation:[facility]OU     Lab Studies  Labs Reviewed   VH URINALYSIS WITH MICROSCOPIC AND CULTURE IF INDICATED       -  Abnormal; Notable for the following components:       Result Value    Leukocyte Esterase, UA 25 (*)     Squam Epithel, UA 16 (*)     All other components within normal limits    Narrative:     A Urine Culture has been ordered based upon the Positive UA results.   VH URINE DRUG SCREEN - NO CONFIRMATION - Abnormal; Notable for the following components:    Urine Benzodiazepines Presumptive Positive (*)     All other components within normal limits   CBC AND DIFFERENTIAL - Abnormal; Notable for the following components:    RBC 5.16 (*)     All other components within normal limits   COMPREHENSIVE METABOLIC PANEL - Abnormal; Notable for the following components:    Glucose 106 (*)     All other components within normal limits   VH CULTURE, URINE   TROPONIN I   TROPONIN I   TROPONIN I       EKG:            Procedure / Critical Care time/EKG:   Procedures    Total time providing critical care:     ATTESTATIONS   This chart was generated by an EMR and may contain errors or additions/omissions not intended by the user.         Blima Dessert, MD     Blima Dessert, MD  08/19/22 [phone]

## 2022-08-19 NOTE — Patient Care Conference (Signed)
A 76 yo female admitted to room 303 with Syncope.  She had 3 episodes at home and her BP was elevated and took three dose of her clonidine half pill.  She is sitting up in bed and was eating lunch.  She has some discomfort if she would move otherwise feeling ok.  She has  just had a pacemaker placed on September 19.  Her only concern is why is this happening (syncopal episodes) and her elevated BP.  Completed via Virtual nursing.     Seward Meth, RN

## 2022-08-19 NOTE — ED Notes (Addendum)
Reviewed results of pacemaker interrogation with patient, she has verbalized comprehension that her results were all within normal limits.  She states that she had a sleep study done recently and has not received the results.  She states that she believes her episodes yesterday could be related to her sleep apnea.

## 2022-08-19 NOTE — ED Notes (Signed)
Pacemaker interrogated, info given to MD

## 2022-08-19 NOTE — ED Notes (Signed)
PT STATES SHE HAS HER MEDICATION WITH HER  PT REQUESTING TO TAKE HER "MORNING MEDS"  MD NOTIFIED AND AGREED  PT TAKING:  50 MG METOPROLOL                        40 MG OLMESARTAN  MEDICATION LIST UPDATED ON CHART

## 2022-08-20 ENCOUNTER — Encounter: Payer: Self-pay | Admitting: Student in an Organized Health Care Education/Training Program

## 2022-08-20 DIAGNOSIS — R55 Syncope and collapse: Secondary | ICD-10-CM

## 2022-08-20 LAB — CBC AND DIFFERENTIAL
Basophils %: 0.8 % (ref 0.0–3.0)
Basophils Absolute: 0.1 10*3/uL (ref 0.0–0.3)
Eosinophils %: 5.6 % (ref 0.0–7.0)
Eosinophils Absolute: 0.4 10*3/uL (ref 0.0–0.8)
Hematocrit: 46.5 % (ref 36.0–48.0)
Hemoglobin: 14 gm/dL (ref 12.0–16.0)
Lymphocytes Absolute: 2.1 10*3/uL (ref 0.6–5.1)
Lymphocytes: 32.4 % (ref 15.0–46.0)
MCH: 28 pg (ref 28–35)
MCHC: 30 gm/dL — ABNORMAL LOW (ref 32–36)
MCV: 94 fL (ref 80–100)
MPV: 9.1 fL (ref 6.0–10.0)
Monocytes Absolute: 0.4 10*3/uL (ref 0.1–1.7)
Monocytes: 6.9 % (ref 3.0–15.0)
Neutrophils %: 54.2 % (ref 42.0–78.0)
Neutrophils Absolute: 3.5 10*3/uL (ref 1.7–8.6)
PLT CT: 186 10*3/uL (ref 130–440)
RBC: 4.97 10*6/uL (ref 3.80–5.00)
RDW: 12.5 % (ref 11.0–14.0)
WBC: 6.5 10*3/uL (ref 4.0–11.0)

## 2022-08-20 LAB — BASIC METABOLIC PANEL
Anion Gap: 12.9 mMol/L (ref 7.0–18.0)
BUN / Creatinine Ratio: 22.9 Ratio (ref 10.0–30.0)
BUN: 16 mg/dL (ref 7–22)
CO2: 22 mMol/L (ref 20–30)
Calcium: 9.5 mg/dL (ref 8.5–10.5)
Chloride: 109 mMol/L (ref 98–110)
Creatinine: 0.7 mg/dL (ref 0.60–1.20)
EGFR: 89 mL/min/{1.73_m2} (ref 60–150)
Glucose: 92 mg/dL (ref 71–99)
Osmolality Calculated: 280 mOsm/kg (ref 275–300)
Potassium: 3.9 mMol/L (ref 3.5–5.3)
Sodium: 140 mMol/L (ref 136–147)

## 2022-08-20 MED ORDER — LABETALOL HCL 200 MG PO TABS
100.0000 mg | ORAL_TABLET | Freq: Two times a day (BID) | ORAL | Status: DC
Start: 2022-08-20 — End: 2022-08-20

## 2022-08-20 MED ORDER — LABETALOL HCL 100 MG PO TABS
100.0000 mg | ORAL_TABLET | Freq: Two times a day (BID) | ORAL | 0 refills | Status: DC
Start: 2022-08-20 — End: 2022-09-10

## 2022-08-20 MED ORDER — CLONIDINE HCL 0.1 MG PO TABS
0.0500 mg | ORAL_TABLET | Freq: Three times a day (TID) | ORAL | Status: DC | PRN
Start: 2022-08-20 — End: 2022-08-20

## 2022-08-20 MED ORDER — ROSUVASTATIN CALCIUM 10 MG PO TABS
5.0000 mg | ORAL_TABLET | ORAL | Status: DC
Start: 2022-08-21 — End: 2022-08-20

## 2022-08-20 MED ORDER — CLONIDINE HCL 0.1 MG PO TABS
0.0500 mg | ORAL_TABLET | Freq: Three times a day (TID) | ORAL | 0 refills | Status: AC | PRN
Start: 2022-08-20 — End: 2022-08-30

## 2022-08-20 MED ORDER — CLONIDINE HCL 0.1 MG PO TABS
0.2000 mg | ORAL_TABLET | Freq: Two times a day (BID) | ORAL | Status: DC
Start: 2022-08-20 — End: 2022-08-20
  Filled 2022-08-20: qty 2

## 2022-08-20 NOTE — Progress Notes (Addendum)
Quick Doc  [facility] ACUTE CARE   Patient Name: [facility]   Attending Physician: Deberah Pelton A*   Today's date:   08/20/2022 LOS: 0 days   Expected Discharge Date      Quick  Assessment:                                                                   CM Comments: (P) 9/25- CM- admitted obs w/ syncope- pt has insurance and PCP- no discharge needs identified- please contact CM directly for needs    Physical Discharge Disposition: (P) Home                                                                          Physical Discharge Disposition: (P) Home    Reviewed chart, patient admitted as obs, low risk, no anticipated needs. Consult CM for needs.     Provider Notifications:        [place] Chidinma Clites, BSN, RN  Case Management   [facility]  [address]  [place] [address]

## 2022-08-20 NOTE — PT Plan of Care Note (Signed)
[facility]  [address]  [place], [place] [address]    Department of Rehabilitation  [phone]    Rehabilitation General/Hold Note      Patient: Christine Little    MRN#: [id number]     Time In: 1041 Time Out: 1049     Upon entry into room, patient was up fixing the curtains and ambulating independently throughout her room. She demonstrated no LOB and was able to ambulate into the hallway. She seemed anxious about being discharged and was speaking to her cardiologist during this time. Patient reported no therapy needs at this time and wanted to be discharged. PT spoke with her RN: Tresa Endo regarding this. No acute care needs required.     Waynard Edwards, PT, DPT

## 2022-08-20 NOTE — OT Eval Note (Signed)
Thank you for allowing [place] to participate in the care of  Christine Little.  Regulations from the Center for Medicare and Medicaid Services (CMS) require your review and approval for this Plan of Care.  Please co-sign this note indicating you are in agreement with the Occupational Therapy Plan of Care.      Christine Little  [address] [place] [address]    Department of Rehabilitation  [phone]    Occupational Therapy Evaluation    Christine Little    CSN#: [id number]  [facility] ACUTE CARE 303/303-A    Consult received for Christine Little for OT Evaluation and Treatment.  Patient's medical condition is appropriate for Occupational therapy intervention at this time.    Time of treatment:   Time Calculation  OT Received On: 08/20/22  Start Time: 0921  Stop Time: 0945  Time Calculation (min): 24 min  Total Treatment Time (min): 24    Visit#: 1    Precautions and Contraindications:   Pacemaker Precautions: no pushing; no pulling; no lifting arm above shoulder  Falls    OT Assessment/Clinical Decision Making:      Christine Little is a 76 y.o. female admitted 08/19/2022 with three syncopal episode and HTN. Pt is s/p pacemaker 08/14/2022. Co-morbidities impacting tx include bilateral TKAs and HTN. Prior to admission Pt living alone where she was able to complete all ADLs and IADLs independently. Pt currently is at her baseline and ADLs and mobility with no further OT at discharge. At this time Pt is most appropriate for home and OT services will be discharged.     Patient presenting with the following OT impairments:  appears to be at baseline for ADLs, appears to be at baseline for mobility      Upon review of the occupational profile and client history, the patient's history was found to be BRIEF.  During assessment of the occupational performance, the patient demonstrated no performance deficits with no modifications of task or assistance during assessment.  The patient has  no treatment options in order to improve patient's level of function.  After review of the above, patient's degree of complexity is LOW.    Rehabilitation Potential: Not an OT candidate      Discussed risks and benefits and Plan of Care with: Patient    Plan:   Treatment/interventions: no skilled acute care interventions needed at this time    Treatment Frequency: OT Frequency Recommended: one time visit - therapy discontinued    Goals: (timeframe: by d/c)  Not applicable     DISCHARGE RECOMMENDATIONS   DME recommended for Discharge:   None    Discharge Recommendations:   Home with no needs        Medical & Therapy History:   Medical Diagnosis: Syncope [R55]  Status cardiac pacemaker [Z95.0]  Syncope, unspecified syncope type [R55]  Hypertension, unspecified type [I10]    Christine Little is a 76 y.o. female admitted on 08/19/2022 with syncopal episodes .    X-Rays/Tests/Labs:  XR Chest AP Portable    Result Date: 08/19/2022  No acute findings. ReadingStation:[facility]      Discussed with patient/family/caregiver the patient's physical, cognitive and/or psychosocial history related to current functional performance: yes    Previous therapy services: none noted     Patient Active Problem List   Diagnosis    Olecranon bursitis, right elbow    Impingement syndrome of right shoulder    History of endometrial cancer  Dyslipidemia    Primary hypertension    Syncope        Past Medical/Surgical History:  Past Medical History:   Diagnosis Date    Primary malignant neoplasm of endometrium       Past Surgical History:   Procedure Laterality Date    CHOLECYSTECTOMY  11/2017    HYSTERECTOMY      TOTAL KNEE ARTHROPLASTY Bilateral 04/2017         Occupational Profile:   Home Living Arrangements:  Living Arrangements: Alone  Assistance Available: limited   Type of Home: House  Home Layout: Two level, with no stairs to enter, Bed/bath upstairs, 2 handrails up the flight of stairs   Bathroom:  tub/shower with grab bar  DME Currently at  Home: none   Home O2 use: none    Prior Level of Function:  Mobility: independent with no AD  Fall History: no falls      Activities of Daily Living:   Independent    Instrumental Activities of Daily Living:  Patient was independent with all IADLs.    Work:  Retired    Leisure:  Enjoys to garden and Psychologist, counselling goal for OT: to leave Little and go home, she wants to see her cardiologist post op pacemaker on Wednesday    Patient/caregiver concerns & priorities: pt wants to speak with MD regarding her care, MD notified     Subjective:   "I feel like I drift off like I am dying and I have to wake back up when I have these episodes.It is not when I am walking or doing dishes. It is when I am sitting."  Patient is agreeable to participation in the therapy session.     Pain:  At Rest: 0 /10  With Activity: 0/10  Location: N/A  Interventions: None required  Pain Scale Used: numeric    ASSESSMENT OF OCCUPATIONAL PERFORMANCE:   Observation of Patient:    Patient is in bed with telemetry, on room air in place.     Edema: none noted   Skin Inspection: appeared intact through observable areas not covered by clothing     Vital Signs:   BP at rest:  162/89 mmHg in bed   BP sitting edge of bed: 183/99 mmHg  BP after activity: 167/115 mmHg - pt was asymptomatic  HR at rest: 64 bpm per tele  HR after activity: 87 bpm per tele  SpO2 at rest: 96% on room air      Oriented to: Oriented x4  Command following: Follows ALL commands and directions without difficulty  Alertness/Arousal: Appropriate responses to stimuli   Attention Span:Appears intact  Memory: Appears intact  Safety Awareness: Independent  Insights: Fully aware of deficits  Problem Solving: Able to problem solve independently  Behavior: Cooperative  Coordination: Within Normal Limits (WNL)  Hand dominance: Right handed    Musculoskeletal Examination:   Range of motion:  Right UE: Grossly WFL  Left UE: Not tested due to activity restrictions from pacemaker        Strength:  Right UE: Grossly WFL  Left UE: Grossly WFL         Sensory/Oculomotor Examination:   Auditory:  impaired:  bilaterally  Tactile-Light Touch:  WFL=intact  Visual Acuity:  wears glasses. Able to read name badge without difficulty.         Activities of Daily Living:   Eating: independent based on bringing hand to mouth  Grooming: independent simulated based  on BUE AROM screen  UB dressing:  mod I based on BUE AROM screen  LB dressing: mod I based on sit to/from stands for clothing management up/down hips   Bathing: independent based on transitional movements   Toileting: independent based on sit to/from stands       Functional Mobility:   Bed Mobility:  Supine to Sit:   Modified independence .   HOB elevated  Sit to Supine:   Modified independence .   HOB elevated    Transfers:  Sit to Stand:  Independent with No assistive device.        Stand to Sit:  Independent.        Functional mobility/ambulation: Independent with No assistive device.      Balance:   Good seated and standing balance    Participation and Activity Tolerance   Participation effort: Excellent  Activity Tolerance: Tolerates 10-20 minutes of activity without rest breaks, Activity tolerance does not limit participation    Other Treatment Interventions:   Treatment Activities:   Not applicable          Education Provided:   Topics: Role of occupational therapy, plan of care, goals of therapy and safety with mobility and ADLs, benefits of activity.    Individuals educated: Patient.  Method: Explanation.  Response to education: Verbalized understanding.    Team Communication:   OT communicated with: Physician/NP/PA -  Dr. Karie MainlandALi, PT - Marijean NiemannJaime  OT communicated regarding: Pre-session re: patient status, Patient participation with Therapy  OT/COTA communication: via written note and verbal communication as needed.    Supine, in bed, Needs in reach, and telemetry, on room air    Recommend client sit OOB for all meals and mobilize to/from bathroom  outside of and in addition to OT session.    Raleigh LionsMackenzie N Arilynn Blakeney, OT, OTR/L

## 2022-08-20 NOTE — Discharge Summary (Signed)
Medicine Discharge Summary   [facility]  [facility]   Patient Name: Christine Little   Attending Physician: No att. providers found PCP: Maylene Roes Hipolito Bayley, MD   Date of Admission: 08/19/2022 D/C Date: 08/20/2022   Discharge Diagnoses:       # Recurrent Syncope   # s/p Dual Chamber PPM  # Monitor Interrogation: Baseline bradycardia with ectopic beats    # Hypertension, Likely Rebound in Setting of Medication Noncompliance     # OSA      Hospital Course       Christine Little is a 76 y.o. female patient PMH includes Tachycardia s/p pacer placement, HTN, and HLD.     Patient presents with complaints of three episodes of syncope - described as experiencing auras and feeling like she cant breath or as though her limbs are heavy. The last episode happened while she was a sleep she said she got out of bed and took deep breath then drove herself to the hospital. The symptoms haven't happened since she got to the hospital. She also complains of elevated BP readings at home. She said her metoprolol was recently discontinued and she's been trying to wean herself off her clonidine which on average she take three times a day. She said she does not tolerate a lot of her Blood pressure medications.     9/25: patient is AAOx3, asymtpomatic, reports no active symptoms, no acute events overnight. Cardio D Renzi consulted, recs to switch Toprol to labetalol was appreciated.Patient has an upcoming appt with EP in 2 days.     Principal Problem:    Syncope  Resolved Problems:    * No resolved hospital problems. *    Pending Results and other significant studies:  None     Discharge Instructions:          Disposition: Home  Diet: Cardiac Consistent Carbohydrates  Activity: May resume normal activities  Discharge Code Status: Full Code    No follow-up provider specified.     Discharge Medications:                                                                        Discharge Medication List        Taking      aspirin 81 MG  EC tablet  Dose: 81 mg  Take 1 tablet (81 mg) by mouth daily     Calcium-Magnesium 500-250 MG Tabs  Dose: 1 tablet  Take 1 tablet by mouth daily     cloNIDine 0.1 MG tablet  Dose: 0.05 mg  What changed: reasons to take this  Commonly known as: CATAPRES  Take 0.5 tablets (0.05 mg) by mouth 3 (three) times daily as needed (Bp >160/90 mmhg)     EVENING PRIMROSE OIL PO  Dose: 1 capsule  Take 1 capsule by mouth daily     Flaxseed Oil Oil  Dose: 1 capsule  Take 1 capsule by mouth daily     GLUCOSAMINE CHOND MSM FORMULA PO  Dose: 1 tablet  Take 1 tablet by mouth daily     HYALURONIC ACID PO  Dose: 1 tablet  Take 1 tablet by mouth daily     labetalol 100 MG tablet  Dose: 100  mg  Commonly known as: NORMODYNE  Take 1 tablet (100 mg) by mouth every 12 (twelve) hours     LUTEIN PO  Dose: 1 capsule  Take 1 capsule by mouth daily     mometasone 0.1 % cream  Commonly known as: ELOCON  Apply topically daily as needed (Dry Skin)     olmesartan 40 MG tablet  Dose: 40 mg  Commonly known as: BENICAR  Take 1 tablet (40 mg) by mouth daily     Omega-3 Fish Oil 500 MG Caps  Dose: 1 capsule  Take 1 capsule (500 mg) by mouth daily     rosuvastatin 5 MG tablet  Dose: 5 mg  Commonly known as: CRESTOR  Take 1 tablet (5 mg) by mouth See Admin Instructions Three times weekly on Tuesday, Thursday, and Saturday     Vitamin C 500 MG Caps  Dose: 500 mg  Take 1 capsule (500 mg) by mouth daily     WOMENS MULTI PO  Dose: 1 tablet  Take 1 tablet by mouth daily            STOP taking these medications      metoprolol succinate XL 50 MG 24 hr tablet  Commonly known as: TOPROL-XL               Discharge Day Exam (08/20/2022):     Blood pressure (!) 182/96, pulse 61, temperature 98.1 F (36.7 C), temperature source Temporal, resp. rate 18, height 1.6 m (5\' 3" ), weight 76.3 kg (168 lb 4.8 oz), SpO2 97 %.             General: Patient is awake. In no acute distress.  Chest: CTA bilaterally. No rhonchi, no wheezing. No use of accessory muscles.left chest wall  PPM with clean incision site  CVS: Normal rate and regular rhythm no murmurs, without JVD.  Abdomen: Soft, non-tender, no guarding or rigidity, with normal bowel sounds.  Extremities: No pitting edema, pulses palpable, no calf swelling and no gross deformity.  Skin: Warm, dry  NEURO: No motor or sensory deficits.     Recent Labs      Recent Labs   Lab 08/20/22  0538 08/19/22  0311   WBC 6.5 7.9   RBC 4.97 5.16*   Hemoglobin 14.0 15.4   Hematocrit 46.5 48.0   MCV 94 93   PLT CT 186 185         Recent Labs   Lab 08/19/22  1011 08/19/22  0600 08/19/22  0310   Troponin I 0.01 0.00 0.01     Lab Results   Component Value Date    HGBA1CPERCNT 5.3 03/08/2022     Recent Labs   Lab 08/20/22  0538 08/19/22  0311   Glucose 92 106*   Sodium 140 139   Potassium 3.9 3.9   Chloride 109 105   CO2 22 25   BUN 16 17   Creatinine 0.70 0.84   EGFR 89 72   Calcium 9.5 10.4     Recent Labs   Lab 08/19/22  0600 08/19/22  0311   Magnesium 2.0  --    Phosphorus 3.7  --    Albumin  --  4.2   Protein, Total  --  8.0   Bilirubin, Total  --  0.4   Alkaline Phosphatase  --  103   ALT  --  16   AST (SGOT)  --  21        Allergies:  Bee venom, Tobacco, Atorvastatin, Coreg [carvedilol], Doxazosin, Epinephrine, Hydralazine, Hydrocodone, Molds & smuts, Norvasc [amlodipine], Oxycodone, Powder scent fragrance, Tramadol, Hydrochlorothiazide, and Lisinopril   Time spent on discharging the patient:  40 minutes   XR Chest AP Portable    Result Date: 08/19/2022  No acute findings. ReadingStation:[email or web address]    Home Health Needs:  There are no questions and answers to display.      Valarie Cones, MD         08/20/22 1:27 PM   MRN: [id number]                                      CSN: [id number] DOB: 12-Aug-1946

## 2022-08-20 NOTE — Pharmacy Admission Med History Basic (Signed)
Medication History/Reconciliation:  Home Medications       Med List Status: Pharmacy Completed Set By: Lily Lovings, PharmD at 08/20/2022 11:57 AM      Status Comment        08/20/2022 12:01 PM     All medications confirmed with patient at bedside as well as with outpatient pharmacy dispense report. According to outpatient pharmacy dispense report patient has not filled majority of her medications recently however she reports compliance with all of her prescription medications; reports she is not as good about taking her OTC supplements daily. Reports she is only prescribed to take Clonidine 0.1 mg three times daily if needed however she reports she actually only takes 0.05 mg (half tablet) when needed for high blood pressure; has needed a few doses recently. Reports her dose of Metoprolol succinate was recently increased to 50 mg daily after being on 25 mg for years. Confirms she takes Rosuvastatin 5 mg three times weekly on Tuesday, Thursday, and Saturday. Reports she recently had pacemaker placed on 08/14/22 and has follow up appointment in a couple days.               Ascorbic Acid (Vitamin C) 500 MG Cap     Take 1 capsule (500 mg) by mouth daily     aspirin 81 MG EC tablet     Take 1 tablet (81 mg) by mouth daily     Calcium-Magnesium 500-250 MG Tab     Take 1 tablet by mouth daily     cloNIDine (CATAPRES) 0.1 MG tablet     Take 0.5 tablets (0.05 mg) by mouth 3 (three) times daily as needed (For blood pressure greater than 160/90)     Notes:  Patient reports prescription states to take 0.1 mg three times daily if needed however she reports she only takes half tablet 0.05 mg if needed; has needed to take a few doses recently.     EVENING PRIMROSE OIL PO     Take 1 capsule by mouth daily     Flaxseed Oil Oil     Take 1 capsule by mouth daily     LUTEIN PO     Take 1 capsule by mouth daily     metoprolol succinate XL (TOPROL-XL) 50 MG 24 hr tablet     Take 1 tablet (50 mg) by mouth daily     Notes:  Reports  dose was recently increased to 50 mg once daily; had previously been taking 25 mg daily     Misc Natural Products (GLUCOSAMINE CHOND MSM FORMULA PO)     Take 1 tablet by mouth daily     mometasone (ELOCON) 0.1 % cream     Apply topically daily as needed (Dry Skin)     Multiple Vitamins-Minerals (WOMENS MULTI PO)     Take 1 tablet by mouth daily     olmesartan (BENICAR) 40 MG tablet     Take 1 tablet (40 mg) by mouth daily     Omega-3 Fatty Acids (Omega-3 Fish Oil) 500 MG Cap     Take 1 capsule (500 mg) by mouth daily     rosuvastatin (CRESTOR) 5 MG tablet     Take 1 tablet (5 mg) by mouth See Admin Instructions Three times weekly on Tuesday, Thursday, and Saturday     Sodium Hyaluronate, oral, (HYALURONIC ACID PO)     Take 1 tablet by mouth daily  Alphonsus Sias, PharmD,   [facility]acy

## 2022-08-20 NOTE — Consults (Signed)
INITIAL CONSULTATION  [facility] CARDIOLOGY & VASCULAR MEDICINE    Date Time: 08/20/22 11:00 AM  Patient Name: Christine Little, Christine Little  MRN#: [id number]  DOB: 1946-09-17  Requesting Physician: Valarie Cones*  PCP: Shanna Cisco, MD       1.  DOE  remote cath no stents in [place]  a) Exercise ST 10/01/16-Myocardial perfusion is normal. This is a low risk study. Overall left ventricular systolic function was abnormal. Nuclear stress EF: 70%.      2. Hypertension  3. 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  Patient's   baseline showed sinus rhythm and the patient was in sinus rhythm or sinus   bradycardia first-degree AV block throughout the monitoring time.  The   lowest heart rate identified was 39 bpm.  The highest heart rate   identified was 95 bpm.  There were no significant pauses identified.  The   patient had 81 symptomatic episodes described above.  Of which were   associated with sinus rhythm although's multiple episodes associate with   PACs and PVCs.  Some of her symptoms palpitations were associated with   atrial couplets.  The longest run of atrial dysrhythmia identified with 3   beats.  There was no other complex atrial or ventricular arrhythmias.  The   most of the patient's symptoms appear to be mostly related to isolated   ectopic beats but in the setting of baseline sinus bradycardia.  Although   some of her bradycardia was associated with lightheadedness other episodes   of bradycardia were asymptomatic or only associated with palpitations or   skipped beats.     Reason for Consultation:   Hypertension    History:   Christine Little is a 76 y.o. 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  It appeared that she had some chronotropic incompetence with frequent PACs and PVCs which  were symptomatic felt not be candidate for increased beta-blocker due to her bradycardia.    She actually came in last night due to hypertension.  She wakes up at 3 AM to go to the bathroom and took her blood pressure which was markedly elevated over 200 and she came to the emergency room.  She was given labetalol with improvement of her blood pressure.  She does have multiple drug intolerances feel she was being weaned off her clonidine.    He has not had chest pain shortness of breath or other cardiac issues.    Past Medical History:     Past Medical History:   Diagnosis Date    Primary malignant neoplasm of endometrium        Past Surgical History:     Past Surgical History:   Procedure Laterality Date    CHOLECYSTECTOMY  11/2017    HYSTERECTOMY      TOTAL KNEE ARTHROPLASTY Bilateral 04/2017       Family History:     Family History   Problem Relation Age of Onset    Heart disease Mother     Cancer Mother     Breast cancer Mother     Heart disease Father     Cancer Maternal Aunt     Breast cancer Maternal Aunt     Heart disease Maternal Grandmother     Diabetes Maternal Grandmother  Heart disease Maternal Grandfather     Heart disease Paternal Grandmother     Heart disease Paternal Grandfather        Social History:     Social History     Socioeconomic History    Marital status: Widowed     Spouse name: Not on file    Number of children: Not on file    Years of education: Not on file    Highest education level: Not on file   Occupational History    Occupation: retired   Tobacco Use    Smoking status: Never    Smokeless tobacco: Never   Vaping Use    Vaping Use: Never used   Substance and Sexual Activity    Alcohol use: Never    Drug use: Never    Sexual activity: Not on file   Other Topics Concern    Not on file   Social History Narrative    Not on file     Social Determinants of Health     Financial Resource Strain: Not on file   Food Insecurity: Not on file   Transportation Needs: Not on file   Physical  Activity: Not on file   Stress: Not on file   Social Connections: Not on file   Intimate Partner Violence: Not on file   Housing Stability: Not on file       Allergies:     Allergies   Allergen Reactions    Bee Venom Anaphylaxis    Tobacco Anaphylaxis, Dizziness, Respiratory Distress and Shortness Of Breath    Atorvastatin Muscle Pain and Other (See Comments)     Body pain, loss of sleep  Unknown      Coreg [Carvedilol] Muscle Pain     Muscle cramps all over and then could not breathe.     Doxazosin Dizziness     Feel bad for 6 months could not walk across the room    Epinephrine     Hydralazine Dizziness     Vision issues     Hydrocodone Nausea Only    Molds & Smuts Other (See Comments) and Nausea And Vomiting     Severe headache and tearing of the eyes  Severe headache and tearing of the eyes      Norvasc [Amlodipine]      Makes her feel knocked out     Oxycodone Nausea Only    Powder Scent Fragrance     Tramadol Other (See Comments) and Nausea And Vomiting     Unknown  Unknown      Hydrochlorothiazide Anxiety    Lisinopril Anxiety       Medications:     Current Facility-Administered Medications   Medication Dose Route Frequency Provider Last Rate Last Admin    acetaminophen (TYLENOL) tablet 650 mg  650 mg Oral Q6H PRN Onokwai, Nkechi C, MD        aspirin chewable tablet 81 mg  81 mg Oral Daily Onokwai, Nkechi C, MD   81 mg at 08/20/22 0803    cloNIDine (CATAPRES) tablet 0.2 mg  0.2 mg Oral BID Alislambouli, Mawaddah A, MD        enoxaparin (LOVENOX) syringe 40 mg  40 mg Subcutaneous Q24H Onokwai, Nkechi C, MD   40 mg at 08/20/22 0803    irbesartan (AVAPRO) tablet 300 mg  300 mg Oral Daily Grace Bushy C, MD   300 mg at 08/20/22 0803    metoprolol succinate XL (TOPROL-XL) 24 hr tablet 50  mg  50 mg Oral Daily Grace Bushy C, MD   50 mg at 08/20/22 0803    naloxone (NARCAN) injection 0.4 mg  0.4 mg Intravenous PRN Wylie Hail, MD        rosuvastatin (CRESTOR) tablet 5 mg  5 mg Oral QHS Onokwai, Nkechi C,  MD        sodium chloride (PF) 0.9 % injection 3 mL  3 mL Intravenous Q12H SCH Onokwai, Nkechi C, MD   3 mL at 08/20/22 0807    sodium chloride 0.9 % bolus 1,000 mL  1,000 mL Intravenous Once in ED Blima Dessert, MD           Review of Systems:   A comprehensive review of systems was: Otherwise negative except as stated above.    Physical Exam:     Vitals:    08/20/22 0739   BP: (!) 182/96   Pulse: 61   Resp: 18   Temp: 98.1 F (36.7 C)   SpO2: 97%       Intake and Output Summary (Last 24 hours) at Date Time  No intake or output data in the 24 hours ending 08/20/22 1100    General appearance - alert, well appearing, and in no distress  Neck - supple, no significant adenopathy, no thyromegaly, no masses  Chest - clear to auscultation, no wheezes, rales or rhonchi  Heart - normal rate, regular rhythm, normal S1, S2, no murmurs, rubs, clicks or gallops  Abdomen - soft, nontender, nondistended, no masses or organomegaly  Neurological - alert, oriented, cranial nerves grossly intact, moves all extremities well.  Extremities - peripheral pulses normal, no pedal edema, no clubbing or cyanosis  Skin - normal coloration and turgor, no rashes, no suspicious skin lesions noted      Labs Reviewed:     Recent Labs   Lab 08/20/22  0538 08/19/22  0600 08/19/22  0311   Glucose 92  --  106*   BUN 16  --  17   Creatinine 0.70  --  0.84   Sodium 140  --  139   Potassium 3.9  --  3.9   Chloride 109  --  105   CO2 22  --  25   Magnesium  --  2.0  --    AST (SGOT)  --   --  21   ALT  --   --  16   TSH (Reflex Free T4 if Indicated)  --  2.50  --              Recent Labs   Lab 08/20/22  0538 08/19/22  0311   WBC 6.5 7.9   Hemoglobin 14.0 15.4   Hematocrit 46.5 48.0   PLT CT 186 185       Recent Labs   Lab 08/19/22  1011 08/19/22  0600 08/19/22  0310   Troponin I 0.01 0.00 0.01       EKG:  Atrial paced.  Rsr' V1      Radiology:   No results found.    Assessment/Recommendations:   Hypertenison: With multiple drug intolerances.  She is  followed by Dr. Maylene Roes and Dr. Vergia Alcon.  Since she did have a good response to IV labetalol she has to further change her blood pressure medicines and will change her to p.o. labetalol.  She been intolerant to carvedilol due to muscle issues but would give this a try.  We will continue her olmesartan.  2.  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  Was interrogated in the emergency room with normal function.  She has these vague episodes that really did not sound to be cardiac.  3.  Dyslipidemia    Thank you very much for this consultation. I will continue to follow along with you.      Simona Huh, MD  [facility] Cardiology  Pager [phone]

## 2022-08-20 NOTE — Plan of Care (Signed)
Problem: Hemodynamic Status: Cardiac  Goal: Stable vital signs and fluid balance  Outcome: Progressing     Problem: Inadequate Tissue Perfusion  Goal: Adequate tissue perfusion will be maintained  Outcome: Progressing     Problem: Ineffective Gas Exchange  Goal: Effective breathing pattern  Outcome: Progressing     Problem: Nutrition  Goal: Nutritional intake is adequate  Outcome: Progressing     Problem: Impaired Mobility  Goal: Mobility/Activity is maintained at optimal level for patient  Outcome: Progressing     Problem: Moderate/High Fall Risk Score >5  Goal: Patient will remain free of falls  Outcome: Progressing

## 2022-08-20 NOTE — Patient Care Conference (Signed)
MDR completed via Virtual nursing.  Possible discharge later.

## 2022-08-20 NOTE — UM Notes (Signed)
VH Utilization Management Review Sheet    Facility :  [facility]    NAME: Christine Little      MR#: [id number]    ROOM: 303/303-A     Date of Birth: 11/24/1946    ADMIT DATE AND TIME:  9/24 @ 0743    ATTENDING PHYSICIAN: Deberah Pelton A*      PAYOR:Payor: MEDICARE [employer] / Plan: [employer] AARP MED ADV [id number] / Product Type: MANAGED MEDICARE /     DATE OF REVIEW COMPLETION: 08/20/2022    DATE REVIEWED: 08/19/2022    Type of Bed: Med-Surg    Chief Complaint/Presentation:   Patient admitted through emergency department who presented with three episodes of syncope - described as experiencing auras and feeling like she cant breath or as though her limbs are heavy. The last episode happened while she was a sleep she said she got out of bed and took deep breath then drove herself to the hospital. The symptoms haven't happened since she got to the hospital. She also complains of elevated BP readings at home. She said her metoprolol was recently discontinued and she's been trying to wean herself off her clonidine which on average she take three times a day. She said she does not tolerate a lot of her Blood pressure medications.      Vitals: 97.9-198/100-71-20-97%    Abnl/Pertinent Labs/Radiology/Diagnostic Studies:   Urine Drug Screen - No Confirmation [[id number]] (Abnormal)   Specimen: Urine, Random    Urine Cannabinoids Negative    Urine Phencyclidine Negative    Urine Cocaine Negative    Urine Methamphetamine Negative    Urine Opiates Negative    Urine Amphetamine Negative    Urine Benzodiazepines Presumptive Positive Abnormal     Urine Tricyclics Negative    Urine Methadone Screen Negative    Urine Barbiturates Negative    Urine Oxycodone Negative    Propoxyphene Negative    Urine Buprenorphine Negative    Urine Fentanyl Screen Negative   Urinalysis w Microscopic and Culture if Indicated [[id number]] (Abnormal)   Specimen: Urine, Random    Color, UA Colorless    Clarity, UA Clear    Urine Specific Gravity 1.006     pH, Urine 6.0 pH    Protein, UR Negative mg/dL    Glucose, UA Negative mg/dL    Ketones UA Negative mg/dL    Bilirubin, UA Negative    Blood, UA Negative    Nitrite, UA Negative    Urobilinogen, UA Normal mg/dL    Leukocyte Esterase, UA 25 Abnormal  Leu/uL    UR Micro Performed    WBC, UA <1 /hpf    Squam Epithel, UA 16 High  /lpf     CXR negative.    ED treatment:   NS bolus x 1  Labetalol 10mg  IV x 1    Admission Diagnosis/Op Note:  Hypertension  Syncope    Pertinent Medical History:   Past Medical History:   Diagnosis Date    Primary malignant neoplasm of endometrium      Physical Exam:   1) General Appearance: Alert and oriented x 4. In no acute distress.   2) Eyes: Pink conjunctiva, anicteric sclera. Pupils are equally reactive to light.  3) ENT: Oral mucosa moist with no pharyngeal congestion, erythema or swelling.  4) Neck: Supple, with full range of motion. Trachea is central, no JVD noted  5) Chest: Clear to auscultation bilaterally, no wheezes or rhonchi.  6) CVS: normal rate and regular rhythm, with  no murmurs.  7) Abdomen: Soft, non-tender, no palpable mass. Bowel sounds normal. No CVA tenderness  8) Extremities: No pitting edema, pulses palpable, no calf swelling and no gross deformity.  9) Skin: Warm, dry with normal skin turgor, no rash  10) Lymphatics: No lymphadenopathy in axillary, cervical and inguinal area.   11) Neurological: Cranial nerves II-XII intact. No gross focal motor or sensory deficits noted.  12) Psychiatric: Affect is appropriate. No hallucinations.    MD Consults/Assessments & Plans:   Hypertension  SBP on arrival > 200 - secondary to non compliance   - home regimen - unclear what patient takes. She said she was told to stop taking metoprolol and takes clonidine prn. Takes benicar as prescribed.- Metoprolol 50xl prescribed 9/11 - pre records   - Given labetalol 10mg  IV in the ED  - Troponin flat - no chest pain  -- Will continue Metoprolol xl 50mg , and clonidine 0.5mg  BID and   avapro   - Sees cardiologist outpatient      Syncope   - Patient said she had three episode of syncope. When she describes the event she describes  Waking up from sleep with a panicked feeling - hands felt heavy and palpitations   - In the ED her pacemaker was interrogated - results normal  - HD stable   - UDS positive for benzodiazepines - not prescribed  - Electrolytes wnl  - ECG ordered  - check Orthostatic vitals  - Check TSH   - Continuous cardia monitoring   - work with PT/OT  - Admitted for observation      OSA   - Patient states she completed a [place] study at [facility] [place] [facility] and believes she needs home oxygen.    - Incomplete pulse ox study in chart     DVT PPx: Medication VTE Prophylaxis Orders: enoxaparin (LOVENOX) syringe 40 mg  Mechanical VTE Prophylaxis Orders: Mechanical VTE: Pneumatic Compression; Knee high  Dispo: Observation  Healthcare Proxy: Daughter  Code: full code    Orders:   Vital signs q8hrs  Check orthostatic blood pressure  Cardiac monitor  PT/OT eval and treat    MCG Criteria: M-197    Freda Munro BSN RN  Virtual Utilization Review Specialist  [facility]  Direct line [phone]  Fax: [phone]  [email or web address]

## 2022-08-23 ENCOUNTER — Other Ambulatory Visit (RURAL_HEALTH_CENTER): Payer: Self-pay | Admitting: Family Medicine

## 2022-08-23 ENCOUNTER — Telehealth (RURAL_HEALTH_CENTER): Payer: Self-pay

## 2022-08-23 DIAGNOSIS — Z8542 Personal history of malignant neoplasm of other parts of uterus: Secondary | ICD-10-CM

## 2022-08-23 NOTE — Telephone Encounter (Signed)
Can you please break this down to pt?

## 2022-08-23 NOTE — Discharge Summary -  Nursing (Signed)
SPOKE TO PT REGARDING DISCHARGE INSTRUCTIONS AND MEDICATION SCRIPTS AND CHANGES.  PT REPORTS THAT SINCE HER DISCHARGE SHE HAS FOLLOWED UP WITH HER CARDIOLOGIST AND AGREED WITH THE CHANGES TO MEDICATIONS AND SHE IS THANKFUL FOR THE CARE SHE RECEIVED AT [facility].    PT HAD A QUESTION REGARDING HER URINALYSIS IN PARTICULAR WHAT "SQUAM EPITHEL" WAS. THIS RN EXPLAINED THAT THESE ARE SKIN CELLS AND IT IS ACTUALLY WHAT WE WOULD CONSIDER TO BE A "CONTAMINATED" OR "NOT CLEAN" SPECIMEN. PT STATED THAT SHE UNDERSTOOD MY EXPLANATION AND THANKED ME FOR EXPLAINING THE RESULTS.

## 2022-08-23 NOTE — Telephone Encounter (Signed)
Pt called about concerns from her urinalysis. She said she saw that the Squam Epithel was abnormal and she does have a history of endometrial cancer but had everything taken out and wondering if this would be a concern of endometrial cancer? Also wondering if you can put in a new referral for OBGYN. She is not pleased with her current practice.

## 2022-09-10 ENCOUNTER — Ambulatory Visit: Payer: No Typology Code available for payment source | Attending: Family Medicine | Admitting: Family Medicine

## 2022-09-10 ENCOUNTER — Encounter (RURAL_HEALTH_CENTER): Payer: Self-pay | Admitting: Family Medicine

## 2022-09-10 VITALS — BP 126/84 | HR 66 | Temp 97.3°F | Resp 16 | Ht 63.0 in | Wt 161.0 lb

## 2022-09-10 DIAGNOSIS — I10 Essential (primary) hypertension: Secondary | ICD-10-CM

## 2022-09-10 DIAGNOSIS — M25512 Pain in left shoulder: Secondary | ICD-10-CM

## 2022-09-10 DIAGNOSIS — Z780 Asymptomatic menopausal state: Secondary | ICD-10-CM

## 2022-09-10 DIAGNOSIS — Z8542 Personal history of malignant neoplasm of other parts of uterus: Secondary | ICD-10-CM

## 2022-09-10 DIAGNOSIS — M7541 Impingement syndrome of right shoulder: Secondary | ICD-10-CM

## 2022-09-10 DIAGNOSIS — G8929 Other chronic pain: Secondary | ICD-10-CM | POA: Insufficient documentation

## 2022-09-10 DIAGNOSIS — F411 Generalized anxiety disorder: Secondary | ICD-10-CM

## 2022-09-10 DIAGNOSIS — G4733 Obstructive sleep apnea (adult) (pediatric): Secondary | ICD-10-CM | POA: Insufficient documentation

## 2022-09-10 DIAGNOSIS — Z950 Presence of cardiac pacemaker: Secondary | ICD-10-CM

## 2022-09-10 DIAGNOSIS — E785 Hyperlipidemia, unspecified: Secondary | ICD-10-CM

## 2022-09-10 MED ORDER — BUSPIRONE HCL 7.5 MG PO TABS
7.5000 mg | ORAL_TABLET | Freq: Two times a day (BID) | ORAL | 2 refills | Status: DC
Start: ? — End: 2022-09-10

## 2022-09-10 NOTE — Progress Notes (Signed)
Progress Note      Patient Name:  Christine Little    Subjective/HPI Comments:    Patient is a  76 y.o. female who presents with follow-up from hospital admission of 08/19/2022-08/20/2022 with the following diagnoses:    # Recurrent Syncope   # s/p Dual Chamber PPM  # Monitor Interrogation: Baseline bradycardia with ectopic beats    # Hypertension, Likely Rebound in Setting of Medication Noncompliance     # OSA        Chief Complaint   Patient presents with    Follow-up     Follow up from cardio, post pacemaker and implant, Set up DXA       Outpatient Medications Marked as Taking for the 09/10/22 encounter (Office Visit) with Khylan Sawyer, Hipolito Bayley, MD   Medication Sig Dispense Refill    Ascorbic Acid (Vitamin C) 500 MG Cap Take 1 capsule (500 mg) by mouth daily      aspirin 81 MG EC tablet Take 1 tablet (81 mg) by mouth daily      Calcium-Magnesium 500-250 MG Tab Take 1 tablet by mouth daily      EVENING PRIMROSE OIL PO Take 1 capsule by mouth daily      Flaxseed Oil Oil Take 1 capsule by mouth daily      LUTEIN PO Take 1 capsule by mouth daily      Metoprolol Succinate 100 MG Capsule ER 24 Hour Sprinkle Take 2 capsules (200 mg) by mouth daily      Misc Natural Products (GLUCOSAMINE CHOND MSM FORMULA PO) Take 1 tablet by mouth daily      mometasone (ELOCON) 0.1 % cream Apply topically daily as needed (Dry Skin)      Multiple Vitamins-Minerals (WOMENS MULTI PO) Take 1 tablet by mouth daily      olmesartan (BENICAR) 40 MG tablet Take 1 tablet (40 mg) by mouth daily      Omega-3 Fatty Acids (Omega-3 Fish Oil) 500 MG Cap Take 1 capsule (500 mg) by mouth daily      rosuvastatin (CRESTOR) 5 MG tablet Take 1 tablet (5 mg) by mouth See Admin Instructions Three times weekly on Tuesday, Thursday, and Saturday      Sodium Hyaluronate, oral, (HYALURONIC ACID PO) Take 1 tablet by mouth daily      [DISCONTINUED] Toprol XL 100 MG 24 hr tablet 2 tablets (200 mg)       Allergies   Allergen Reactions    Bee Venom Anaphylaxis    Tobacco  Anaphylaxis, Dizziness, Respiratory Distress and Shortness Of Breath    Atorvastatin Muscle Pain and Other (See Comments)     Body pain, loss of sleep  Unknown      Coreg [Carvedilol] Muscle Pain     Muscle cramps all over and then could not breathe.     Doxazosin Dizziness     Feel bad for 6 months could not walk across the room    Epinephrine     Hydralazine Dizziness     Vision issues     Hydrocodone Nausea Only    Molds & Smuts Other (See Comments) and Nausea And Vomiting     Severe headache and tearing of the eyes  Severe headache and tearing of the eyes      Norvasc [Amlodipine]      Makes her feel knocked out     Oxycodone Nausea Only    Powder Scent Fragrance     Tramadol Other (See Comments) and Nausea And  Vomiting     Unknown  Unknown      Hydrochlorothiazide Anxiety    Lisinopril Anxiety     Past Medical History:   Diagnosis Date    Primary malignant neoplasm of endometrium      Past Surgical History:   Procedure Laterality Date    CARDIAC PACEMAKER PLACEMENT  08/14/2022    Placed by Bufford Buttner    CHOLECYSTECTOMY  11/2017    HYSTERECTOMY      TOTAL KNEE ARTHROPLASTY Bilateral 04/2017     Social History     Occupational History    Occupation: retired   Tobacco Use    Smoking status: Never    Smokeless tobacco: Never   Vaping Use    Vaping Use: Never used   Substance and Sexual Activity    Alcohol use: Never    Drug use: Never    Sexual activity: Not on file     Family History   Problem Relation Age of Onset    Heart disease Mother     Cancer Mother     Breast cancer Mother     Heart disease Father     Cancer Maternal Aunt     Breast cancer Maternal Aunt     Heart disease Maternal Grandmother     Diabetes Maternal Grandmother     Heart disease Maternal Grandfather     Heart disease Paternal Grandmother     Heart disease Paternal Grandfather        Review of Systems:  Review of Systems   Constitutional:  Negative for chills, diaphoresis, fever, malaise/fatigue and weight loss.   HENT:  Negative for  congestion, ear discharge, ear pain, sinus pain and sore throat.    Eyes:  Negative for blurred vision, pain, discharge and redness.   Respiratory:  Negative for cough, shortness of breath and wheezing.    Cardiovascular:  Negative for chest pain, palpitations and orthopnea.   Gastrointestinal:  Negative for abdominal pain, diarrhea, nausea and vomiting.   Genitourinary:  Negative for dysuria, frequency and urgency.   Musculoskeletal:  Negative for joint pain and myalgias.   Skin:  Negative for rash.   Neurological:  Negative for dizziness, tingling, weakness and headaches.   Endo/Heme/Allergies:  Negative for environmental allergies and polydipsia. Does not bruise/bleed easily.   Psychiatric/Behavioral:  Negative for depression and suicidal ideas. The patient is not nervous/anxious and does not have insomnia.        Objective/ Physical Exam:  Vitals:    09/10/22 1312   BP: 126/84   Pulse: 66   Resp: 16   Temp: 97.3 F (36.3 C)   TempSrc: Temporal   SpO2: 97%   Weight: 73 kg (161 lb)   Height: 1.6 m (5\' 3" )     Body mass index is 28.52 kg/m.  Pain Score: 0-No pain    Physical Exam  Assessment:    1. Primary hypertension    2. Impingement syndrome of right shoulder    3. Dyslipidemia    4. History of endometrial cancer    5. Pacemaker    6. OSA (obstructive sleep apnea)    7. Generalized anxiety disorder    8. Chronic left shoulder pain    9. Postmenopausal        Plan: Begin BuSpar 7.5 mg #60 take 1 twice a day with 2 refills.  Orders Placed This Encounter   Procedures    Dxa Bone Density Axial Skeleton     Standing Status:  Future     Standing Expiration Date:   10/10/2023     Order Specific Question:   Reason for Exam:     Answer:   Postmenopausal     Order Specific Question:   Release to patient     Answer:   Immediate    Ambulatory referral to Sleep Studies     Standing Status:   Future     Standing Expiration Date:   09/11/2023     Referral Priority:   Routine     Referral Type:   Consultation     Referral  Reason:   Specialty Services Required     Number of Visits Requested:   1    Ambulatory referral to Physical Therapy     Standing Status:   Future     Standing Expiration Date:   09/11/2023     Referral Priority:   Routine     Referral Type:   Consultation     Referral Reason:   Specialty Services Required     Requested Specialty:   Physical Therapy     Number of Visits Requested:   1     Outpatient Encounter Medications as of 09/10/2022   Medication Sig Dispense Refill    Ascorbic Acid (Vitamin C) 500 MG Cap Take 1 capsule (500 mg) by mouth daily      aspirin 81 MG EC tablet Take 1 tablet (81 mg) by mouth daily      Calcium-Magnesium 500-250 MG Tab Take 1 tablet by mouth daily      EVENING PRIMROSE OIL PO Take 1 capsule by mouth daily      Flaxseed Oil Oil Take 1 capsule by mouth daily      LUTEIN PO Take 1 capsule by mouth daily      Metoprolol Succinate 100 MG Capsule ER 24 Hour Sprinkle Take 2 capsules (200 mg) by mouth daily      Misc Natural Products (GLUCOSAMINE CHOND MSM FORMULA PO) Take 1 tablet by mouth daily      mometasone (ELOCON) 0.1 % cream Apply topically daily as needed (Dry Skin)      Multiple Vitamins-Minerals (WOMENS MULTI PO) Take 1 tablet by mouth daily      olmesartan (BENICAR) 40 MG tablet Take 1 tablet (40 mg) by mouth daily      Omega-3 Fatty Acids (Omega-3 Fish Oil) 500 MG Cap Take 1 capsule (500 mg) by mouth daily      rosuvastatin (CRESTOR) 5 MG tablet Take 1 tablet (5 mg) by mouth See Admin Instructions Three times weekly on Tuesday, Thursday, and Saturday      Sodium Hyaluronate, oral, (HYALURONIC ACID PO) Take 1 tablet by mouth daily      [DISCONTINUED] Toprol XL 100 MG 24 hr tablet 2 tablets (200 mg)      busPIRone (BUSPAR) 7.5 MG tablet Take 1 tablet (7.5 mg) by mouth 2 (two) times daily 60 tablet 2    [DISCONTINUED] labetalol (NORMODYNE) 100 MG tablet Take 1 tablet (100 mg) by mouth every 12 (twelve) hours 60 tablet 0     No facility-administered encounter medications on file as of  09/10/2022.         The patient has been informed of all ordered tests and/or consults, if applicable.  All patient concerns and questions have been addressed.      Shanna Cisco, MD  [facility]  [facility] [place]  [phone]  [place] [phone]  [phone]    This note was generated by the Epic EMR system/ Dragon speech recognition and may contain inherent errors or omissions not intended by the user. Grammatical errors, random word insertions, deletions, pronoun errors and incomplete sentences are occasional consequences of this technology due to software limitations. Not all errors are caught or corrected. If there are questions or concerns about the content of this note or information contained within the body of this dictation they should be addressed directly with the author for clarification

## 2022-09-25 ENCOUNTER — Ambulatory Visit
Admission: RE | Admit: 2022-09-25 | Discharge: 2022-09-25 | Disposition: A | Discharge status: Home / Self Care | Payer: No Typology Code available for payment source | Source: Ambulatory Visit | Attending: Family Medicine | Admitting: Family Medicine

## 2022-09-25 DIAGNOSIS — Z780 Asymptomatic menopausal state: Secondary | ICD-10-CM | POA: Insufficient documentation

## 2022-09-25 DIAGNOSIS — M8589 Other specified disorders of bone density and structure, multiple sites: Secondary | ICD-10-CM | POA: Insufficient documentation

## 2022-12-04 ENCOUNTER — Ambulatory Visit: Payer: Self-pay

## 2022-12-19 ENCOUNTER — Ambulatory Visit (RURAL_HEALTH_CENTER): Payer: Self-pay | Admitting: Family Medicine

## 2023-01-02 ENCOUNTER — Ambulatory Visit (RURAL_HEALTH_CENTER): Payer: Self-pay | Admitting: Family Medicine

## 2023-01-25 ENCOUNTER — Ambulatory Visit (RURAL_HEALTH_CENTER): Payer: Self-pay | Admitting: Family Medicine

## 2023-04-17 ENCOUNTER — Ambulatory Visit (RURAL_HEALTH_CENTER): Payer: Self-pay | Admitting: Dermatology

## 2023-09-27 ENCOUNTER — Other Ambulatory Visit (INDEPENDENT_AMBULATORY_CARE_PROVIDER_SITE_OTHER): Payer: Self-pay | Admitting: Otolaryngology
